# Patient Record
Sex: Female | Born: 1955 | Race: White | Hispanic: No | Marital: Married | Smoking: Never smoker
Health system: Southern US, Community
[De-identification: ages and names within clinical notes are randomized; demographics above are authoritative.]

## PROBLEM LIST (undated history)

## (undated) DIAGNOSIS — E119 Type 2 diabetes mellitus without complications: Secondary | ICD-10-CM

## (undated) DIAGNOSIS — Z8679 Personal history of other diseases of the circulatory system: Secondary | ICD-10-CM

## (undated) DIAGNOSIS — I428 Other cardiomyopathies: Secondary | ICD-10-CM

## (undated) DIAGNOSIS — K219 Gastro-esophageal reflux disease without esophagitis: Secondary | ICD-10-CM

## (undated) DIAGNOSIS — M1990 Unspecified osteoarthritis, unspecified site: Secondary | ICD-10-CM

## (undated) DIAGNOSIS — M5126 Other intervertebral disc displacement, lumbar region: Secondary | ICD-10-CM

## (undated) DIAGNOSIS — E669 Obesity, unspecified: Secondary | ICD-10-CM

## (undated) DIAGNOSIS — J189 Pneumonia, unspecified organism: Secondary | ICD-10-CM

## (undated) DIAGNOSIS — I429 Cardiomyopathy, unspecified: Secondary | ICD-10-CM

## (undated) DIAGNOSIS — R55 Syncope and collapse: Secondary | ICD-10-CM

## (undated) DIAGNOSIS — N6091 Unspecified benign mammary dysplasia of right breast: Secondary | ICD-10-CM

## (undated) DIAGNOSIS — Z8614 Personal history of Methicillin resistant Staphylococcus aureus infection: Secondary | ICD-10-CM

## (undated) DIAGNOSIS — Z8701 Personal history of pneumonia (recurrent): Secondary | ICD-10-CM

## (undated) DIAGNOSIS — R011 Cardiac murmur, unspecified: Secondary | ICD-10-CM

## (undated) HISTORY — PX: CATARACT EXTRACTION W/ INTRAOCULAR LENS  IMPLANT, BILATERAL: SHX1307

## (undated) HISTORY — PX: TUBAL LIGATION: SHX77

## (undated) HISTORY — DX: Other cardiomyopathies: I42.8

## (undated) HISTORY — PX: ABDOMINAL HYSTERECTOMY: SHX81

## (undated) HISTORY — PX: OTHER SURGICAL HISTORY: SHX169

## (undated) HISTORY — DX: Cardiomyopathy, unspecified: I42.9

## (undated) HISTORY — DX: Syncope and collapse: R55

## (undated) HISTORY — DX: Personal history of pneumonia (recurrent): Z87.01

## (undated) HISTORY — PX: DILATION AND CURETTAGE OF UTERUS: SHX78

## (undated) HISTORY — DX: Personal history of other diseases of the circulatory system: Z86.79

---

## 2004-10-30 ENCOUNTER — Ambulatory Visit: Payer: Self-pay | Admitting: Cardiology

## 2005-12-07 ENCOUNTER — Ambulatory Visit: Payer: Self-pay | Admitting: Cardiology

## 2006-07-20 ENCOUNTER — Encounter: Admission: RE | Admit: 2006-07-20 | Discharge: 2006-07-20 | Payer: Self-pay | Admitting: Occupational Medicine

## 2006-08-11 ENCOUNTER — Encounter: Admission: RE | Admit: 2006-08-11 | Discharge: 2006-11-09 | Payer: Self-pay | Admitting: Occupational Medicine

## 2006-12-30 ENCOUNTER — Encounter: Payer: Self-pay | Admitting: Physician Assistant

## 2006-12-30 ENCOUNTER — Ambulatory Visit: Payer: Self-pay | Admitting: Cardiology

## 2008-01-31 ENCOUNTER — Emergency Department (HOSPITAL_COMMUNITY): Admission: EM | Admit: 2008-01-31 | Discharge: 2008-01-31 | Payer: Self-pay | Admitting: Emergency Medicine

## 2009-10-20 HISTORY — PX: MICRODISCECTOMY LUMBAR: SUR864

## 2009-11-03 ENCOUNTER — Encounter: Payer: Self-pay | Admitting: Physician Assistant

## 2009-11-04 ENCOUNTER — Ambulatory Visit: Payer: Self-pay | Admitting: Cardiology

## 2009-11-04 DIAGNOSIS — Z8669 Personal history of other diseases of the nervous system and sense organs: Secondary | ICD-10-CM

## 2009-11-06 ENCOUNTER — Ambulatory Visit (HOSPITAL_COMMUNITY): Admission: RE | Admit: 2009-11-06 | Discharge: 2009-11-07 | Payer: Self-pay | Admitting: Neurosurgery

## 2009-11-06 ENCOUNTER — Encounter: Payer: Self-pay | Admitting: Cardiology

## 2009-12-31 ENCOUNTER — Encounter (INDEPENDENT_AMBULATORY_CARE_PROVIDER_SITE_OTHER): Payer: Self-pay | Admitting: *Deleted

## 2009-12-31 ENCOUNTER — Ambulatory Visit: Payer: Self-pay | Admitting: Cardiology

## 2009-12-31 DIAGNOSIS — I429 Cardiomyopathy, unspecified: Secondary | ICD-10-CM | POA: Insufficient documentation

## 2010-01-12 ENCOUNTER — Encounter: Payer: Self-pay | Admitting: Cardiology

## 2010-01-12 ENCOUNTER — Ambulatory Visit: Payer: Self-pay

## 2010-01-12 ENCOUNTER — Ambulatory Visit: Payer: Self-pay | Admitting: Internal Medicine

## 2010-01-12 ENCOUNTER — Ambulatory Visit (HOSPITAL_COMMUNITY): Admission: RE | Admit: 2010-01-12 | Discharge: 2010-01-12 | Payer: Self-pay | Admitting: Cardiology

## 2010-01-14 ENCOUNTER — Encounter (INDEPENDENT_AMBULATORY_CARE_PROVIDER_SITE_OTHER): Payer: Self-pay | Admitting: *Deleted

## 2010-02-03 ENCOUNTER — Encounter: Payer: Self-pay | Admitting: Cardiology

## 2010-02-25 ENCOUNTER — Encounter (INDEPENDENT_AMBULATORY_CARE_PROVIDER_SITE_OTHER): Payer: Self-pay | Admitting: *Deleted

## 2010-03-02 ENCOUNTER — Encounter: Payer: Self-pay | Admitting: Cardiology

## 2010-03-12 ENCOUNTER — Telehealth (INDEPENDENT_AMBULATORY_CARE_PROVIDER_SITE_OTHER): Payer: Self-pay | Admitting: *Deleted

## 2011-01-19 NOTE — Miscellaneous (Signed)
Summary: Pt Info  Clinical Lists Changes  Observations: Added new observation of PI CARDIO: Your physician has requested that you regularly monitor and record your blood pressure readings at home.  Please use the same machine at the same time of day to check your readings and record them to bring to your follow-up visit. Have nurse check blood pressure/heart rate in 2 weeks and fax this along with your readings to our office.  Start Coreg (carvedilol) 6.25 mg by mouth two times a day. Stop Atenolol. (01/14/2010 8:09)      Patient Instructions: 1)  Your physician has requested that you regularly monitor and record your blood pressure readings at home.  Please use the same machine at the same time of day to check your readings and record them to bring to your follow-up visit. 2)  Have nurse check blood pressure/heart rate in 2 weeks and fax this along with your readings to our office.  3)  Start Coreg (carvedilol) 6.25 mg by mouth two times a day. 4)  Stop Atenolol.   Pt requested that this information be faxed to her at her work fax number.

## 2011-01-19 NOTE — Letter (Signed)
Summary: Work Writer at [employer]	[place]. [address]   [place], [place] [address]   Phone: [phone]  Fax: [phone]     December 31, 2009    Dawn Weiss   The above named patient had a medical visit today at:   1:40 pm.  Please take this into consideration when reviewing the time away from work/school.      Sincerely yours,  Architectural technologist

## 2011-01-19 NOTE — Progress Notes (Signed)
Summary:  BLOOD PRESSURE READINGS  Office Visit/ BLOOD PRESSURE READINGS   Imported By: Dorise Hiss 02/03/2010 08:26:25  _____________________________________________________________________  External Attachment:    Type:   Image     Comment:   External Document  Appended Document:  BLOOD PRESSURE READINGS Reviewed.  Continue Coreg and add Altace 2.5 mg at bedtime  This regimen is aimed at both BP and LV dysfunction.  Check BMET in 2 weeks.  Appended Document:  BLOOD PRESSURE READINGS Left message to call back on machine.  Appended Document:  BLOOD PRESSURE READINGS Patient informed of the above. med sent to [facility]. lab order faxed to the [facility].

## 2011-01-19 NOTE — Assessment & Plan Note (Signed)
Summary: 2 YR FU   Visit Type:  Follow-up Primary Provider:  Dr. Wyvonnia Lora   History of Present Illness: 55 year old woman returns to the office for a followup visit. I last saw her in 2008 with history detailed below. More recently she was seen by Dr. Juanda Chance for a preoperative evaluation prior to lumbar disc surgery. She reports no significant problems in the perioperative setting.  Laboratory data from November 2010 revealed WBC 8.4, hemoglobin 14.6, platelets 216, sodium 137, potassium 4.1, BUN 19, creatinine 0.6.  She has not had a followup echocardiogram over the last 4 years as best I can tell. We talked about this today. She also admits to not taking her regular cardiac medications for some time now.  She states that she joined [employer] several months ago. She has not been walking regularly. We discussed diet and exercise today.  Vital Signs:  Patient profile:   55 year old female Height:      65 inches Weight:      253 pounds Pulse rate:   91 / minute BP sitting:   133 / 84  (left arm) Cuff size:   large  Vitals Entered By: Carlye Grippe (December 31, 2009 1:17 PM)   Current Medications (verified): 1)  Aleve 220 Mg Tabs (Naproxen Sodium) .... As Directed 2)  Atenolol 25 Mg Tabs (Atenolol) .... Take 1 Tablet By Mouth Once A Day 3)  Diazepam 5 Mg Tabs (Diazepam) .... Take One By Mouth Every Six Hours As Needed  Allergies (verified): No Known Drug Allergies  Comments:  Nurse/Medical Assistant: The patient's medications and allergies were reviewed with the patient and were updated in the Medication and Allergy Lists. Patient verbally gave names.    Preventive Screening-Counseling & Management  Alcohol-Tobacco     Smoking Status: never  Current Medications (verified): 1)  Aleve 220 Mg Tabs (Naproxen Sodium) .... As Directed 2)  Atenolol 25 Mg Tabs (Atenolol) .... Take 1 Tablet By Mouth Once A Day 3)  Diazepam 5 Mg Tabs (Diazepam) .... Take One By Mouth  Every Six Hours As Needed  Allergies (verified): No Known Drug Allergies  Comments:  Nurse/Medical Assistant: The patient's medications and allergies were reviewed with the patient and were updated in the Medication and Allergy Lists. Patient verbally gave names.  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  Smoking Status:  never  Review of Systems  The patient denies anorexia, fever, chest pain, syncope, dyspnea on exertion, peripheral edema, prolonged cough, headaches, abdominal pain, melena, and hematochezia.         Chronic left knee pain. Back pain is getting better. Otherwise reviewed and negative.  Vital Signs:  Patient profile:   55 year old female Height:      65 inches Weight:      253 pounds Pulse rate:   91 / minute BP sitting:   133 / 84  (left arm) Cuff size:   large  Vitals Entered By: Carlye Grippe (December 31, 2009 1:17 PM)  Physical Exam  Additional Exam:  Obese woman in no acute distress. HEENT: Conjunctiva and lids normal, oropharynx  clear. Neck: Supple, no elevated sugar venous pressure, no bruits. Lungs: Clear to auscultation, nonlabored. Cardiac: Indistinct PMI, regular rate and rhythm, no loud systolic murmur or S3. Abdomen: Obese, unable to palpate liver edge, bowel sounds present. Extremities: Trace edema below the knees, symmetrical, distal pulses one plus. Skin: Warm and dry. Musculoskeletal: No kyphosis. Neuropsychiatric: Alert and oriented x3, affect appropriate.   Impression &  Recommendations:  Problem # 1:  OTHER PRIMARY CARDIOMYOPATHIES (ICD-425.4)  History of idiopathic cardiomyopathy with subsequent normalization of left ventricular systolic function on medical therapy. Last echocardiogram was in 2006. Symptomatically the patient reports NYHA class II dyspnea on exertion, no palpitations, and no chest pain. She has not been compliant with regular medical therapy or followup. We discussed these issues today, and will recommend a followup echocardiogram. At least an annual office visit will be anticipated. Beta blocker therapy is being reintroduced, mainly in light of her prior documented history of neurocardiogenic syncope. Further medications can be added as necessary. I have asked her to keep an eye on her blood pressure as well.  Her updated medication list for this problem includes:    Atenolol 25 Mg Tabs (Atenolol) .Marland Kitchen... Take 1 tablet by mouth once a day  Problem # 2:  SYNCOPE, HX OF (ICD-V12.49)  History of neurocardiogenic syncope, diagnosed at [facility] many years ago. No obvious recurrent events.  Orders: 2-D Echocardiogram (2D Echo)  Patient Instructions: 1)  Your physician has requested that you have an echocardiogram.  Echocardiography is a painless test that uses sound waves to create images of your heart. It provides your doctor with information about the size and shape of your heart and how well your heart's chambers and valves are working.  This procedure takes approximately one hour. There are no restrictions for this procedure. 2)  Take Atenolol 25mg  by mouth once daily. Refill was sent to your pharmacy today. 3)  Your physician wants you to follow-up in: 1 year. You will receive a reminder letter in the mail one-two months in advance. If you don't receive a letter, please call our office to schedule the follow-up appointment. Prescriptions: ATENOLOL 25 MG TABS (ATENOLOL) Take 1 tablet by mouth once a day  #30 x 6   Entered by:   Cyril Loosen, RN,  BSN   Authorized by:   Loreli Slot, MD, [facility]   Signed by:   Cyril Loosen, RN, BSN on 12/31/2009   Method used:   Electronically to        [facility]  [address] [id number] (retail)       [address]       [place], [place]  [address]       Ph: [phone] or [phone]       Fax: [phone]   RxID:   [id number]  Prescriptions: ATENOLOL 25 MG TABS (ATENOLOL) Take 1 tablet by mouth once a day  #30 x 6   Entered by:   Cyril Loosen, RN, BSN   Authorized by:   Loreli Slot, MD, [facility]   Signed by:   Cyril Loosen, RN, BSN on 12/31/2009   Method used:   [facility]ronically to        [facility]  [address] [id number] (retail)       [address]       [place], [place]  [address]  Ph: [phone] or [phone]       Fax: [phone]   RxID:   [id number]

## 2011-01-19 NOTE — Progress Notes (Signed)
Summary: Headaches and wants lab results  Phone Note Call from Patient Call back at [phone]   Summary of Call: Pt called to see if we had results of recent labs. Notified pt we have not received labs but I have printed these from [facility] system and will have Dr. Diona Browner review when he is in the office tomorrow.   Pt states she is having a lot of headache in the afternoon and wonders if the medications could be causing this.  Initial call taken by: Cyril Loosen, RN, BSN,  March 12, 2010 5:16 PM  Follow-up for Phone Call        Reviewed labs - see appended note.  We added Altace most recently - headache not generally a common side effect. Follow-up by: Loreli Slot, MD, [facility],  March 13, 2010 10:34 AM  Additional Follow-up for Phone Call Additional follow up Details #1::        Left message to call back on voicemail. Cyril Loosen, RN, BSN  March 13, 2010 3:14 PM     Additional Follow-up for Phone Call Additional follow up Details #2::    4:30pm Mrs. Dilorenzo retd call to [place] M> Follow-up by: Zachary George,  March 13, 2010 4:34 PM  Additional Follow-up for Phone Call Additional follow up Details #3:: Details for Additional Follow-up Action Taken: Pt notified and verbalized understanding. Pt will follow up with primary MD if headaches continue. Additional Follow-up by: Cyril Loosen, RN, BSN,  March 13, 2010 4:56 PM

## 2011-01-19 NOTE — Letter (Signed)
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   [place], [place] [address]   Phone: [phone]  Fax: [phone]        February 25, 2010 MRN: [id number]    [facility] [address] [place], [place]  [address]    Dear Ms. Clauson,    We ordered lab work to follow up on your electrolytes and kidney function following your medication change on February 17th. You were asked to do this lab work 2 weeks after this medication change. It does not appear this lab work as been done.  Please take the enclosed order to the [facility] at your earliest convenience to have this lab work done.      Sincerely,  Cyril Loosen, RN, BSN  This letter has been electronically signed by your physician.

## 2011-03-24 LAB — BASIC METABOLIC PANEL
BUN: 19 mg/dL (ref 6–23)
CO2: 26 mEq/L (ref 19–32)
Calcium: 9.6 mg/dL (ref 8.4–10.5)
Chloride: 102 mEq/L (ref 96–112)
Creatinine, Ser: 0.68 mg/dL (ref 0.4–1.2)
GFR calc Af Amer: >60 mL/min (ref >60)
GFR calc non Af Amer: >60 mL/min (ref >60)
Glucose, Bld: 129 mg/dL — ABNORMAL HIGH (ref 70–99)
Potassium: 4.1 mEq/L (ref 3.5–5.1)
Sodium: 137 mEq/L (ref 135–145)

## 2011-03-24 LAB — CBC
HCT: 41.7 % (ref 36.0–46.0)
Hemoglobin: 14.6 g/dL (ref 12.0–15.0)
MCHC: 35 g/dL (ref 30.0–36.0)
MCV: 89.5 fL (ref 78.0–100.0)
Platelets: 216 10*3/uL (ref 150–400)
RBC: 4.67 MIL/uL (ref 3.87–5.11)
RDW: 13.4 % (ref 11.5–15.5)
WBC: 8.4 10*3/uL (ref 4.0–10.5)

## 2011-05-07 NOTE — Assessment & Plan Note (Signed)
[facility]                          [facility] OFFICE NOTE   Dawn Weiss, Dawn Weiss                     MRN:          [id number]  DATE:12/30/2006                            DOB:          [birth year]    REFERRING PHYSICIAN:  Wyvonnia Lora   PRIMARY CARE PHYSICIAN:  Dr. Wyvonnia Lora.   REASON FOR VISIT:  Followup neurocardiogenic syncope and idiopathic  cardiomyopathy.   HISTORY OF PRESENT ILLNESS:  I saw Ms. Beitzel in December 2006.  She  has a history of idiopathic cardiomyopathy with subsequent improvement  to normal left ventricular systolic function on medical therapy.  She  has done well on the present medication outlined below and has had no  evidence of syncope.  Since her last visit, she has changed jobs and now  works at [employer].  She had an electrocardiogram obtained  today that showed a normal sinus rhythm at 72 beats per minute with no  marked changes in comparison to the previous tracing, except for a  decrease in QT interval, now at 459 milliseconds.   ALLERGIES:  NO KNOWN DRUG ALLERGIES.   PRESENT MEDICATIONS:  1. Enalapril 2.5 mg p.o. every day.  2. Atenolol 25 mg p.o. every day.   REVIEW OF SYSTEMS:  As described in the history of present illness.   PHYSICAL EXAMINATION:  VITAL SIGNS:  Blood pressure today is 130/82,  heart rate is 72, weight is 275 pounds.  GENERAL:  This is a morbidly obese woman, no acute distress.  HEENT:  Conjunctivae and lids are normal.  Pharynx is clear.  NECK:  Supple without elevated jugular venous pressure, loud bruits.  No  thyromegaly is noted.  LUNGS:  Clear without labored breathing.  CARDIAC:  Reveals a regular rate and rhythm without murmur, rub, or  gallop.  ABDOMEN;  Soft.  Normoactive bowel sounds.  EXTREMITIES:  Show no pitting edema.   IMPRESSION/RECOMMENDATIONS:  1. History of neurocardiogenic syncope, previously evaluated at [facility].   She is doing well on beta-blocker      therapy without significant recurrence.  We will make no specific      adjustments today.  2. Given prior history of idiopathic cardiomyopathy, we will plan a      follow up echocardiogram (last study in 2003).  She is not      demonstrating any congestive heart failure symptoms,      although if her left ventricular systolic function is reduced that      would prompt medication adjustments.  Otherwise if this is stable,      we will plan to continue her yearly followup.     [name], MD  Electronically Signed    SGM/MedQ  DD: 12/30/2006  DT: 12/30/2006  Job #: [id number]   cc:   Wyvonnia Lora

## 2011-05-18 ENCOUNTER — Ambulatory Visit: Payer: Self-pay | Admitting: Cardiology

## 2011-05-19 ENCOUNTER — Ambulatory Visit: Payer: Self-pay | Admitting: Cardiology

## 2011-05-28 ENCOUNTER — Encounter: Payer: Self-pay | Admitting: Cardiology

## 2011-05-31 ENCOUNTER — Ambulatory Visit (INDEPENDENT_AMBULATORY_CARE_PROVIDER_SITE_OTHER): Payer: BC Managed Care – PPO | Admitting: Cardiology

## 2011-05-31 ENCOUNTER — Encounter: Payer: Self-pay | Admitting: *Deleted

## 2011-05-31 ENCOUNTER — Encounter: Payer: Self-pay | Admitting: Cardiology

## 2011-05-31 VITALS — BP 135/80 | HR 69 | Ht 65.0 in | Wt 282.0 lb

## 2011-05-31 DIAGNOSIS — Z79899 Other long term (current) drug therapy: Secondary | ICD-10-CM

## 2011-05-31 DIAGNOSIS — I429 Cardiomyopathy, unspecified: Secondary | ICD-10-CM

## 2011-05-31 DIAGNOSIS — R55 Syncope and collapse: Secondary | ICD-10-CM

## 2011-05-31 MED ORDER — CARVEDILOL 6.25 MG PO TABS: 6.2500 mg | ORAL_TABLET | Freq: Two times a day (BID) | ORAL | Status: DC

## 2011-05-31 MED ORDER — RAMIPRIL 2.5 MG PO CAPS: 2.5000 mg | ORAL_CAPSULE | Freq: Every day | ORAL | Status: DC

## 2011-05-31 NOTE — Progress Notes (Signed)
Clinical Summary Dawn Weiss is a 55 y.o.female presenting for office followup. She was seen back in January. At that time we reintroduced basic medical therapy, finding that she had not taken any regular medications for quite some time. Followup echocardiogram is noted below.  She has a history of neurocardiogenic syncope, no obvious recurrences. She has had some mild dizziness recently. No palpitations.  We discussed the results of her echocardiogram, showing reduction in LVEF compared to previous baseline. She has gained a significant amount of weight, admits that she has not been dieting regularly, and also not exercising. She has changed jobs since our last visit.  Today we discussed the importance of diet, exercise, regular medication use, and followup.   No Known Allergies  Current outpatient prescriptions:carvedilol (COREG) 6.25 MG tablet, Take 1 tablet (6.25 mg total) by mouth 2 (two) times daily., Disp: 60 tablet, Rfl: 6;  diazepam (VALIUM) 5 MG tablet, Take 5 mg by mouth every 6 (six) hours as needed.  , Disp: , Rfl: ;  naproxen sodium (ALEVE) 220 MG tablet, Take 220 mg by mouth as directed.  , Disp: , Rfl: ;  ramipril (ALTACE) 2.5 MG capsule, Take 1 capsule (2.5 mg total) by mouth daily., Disp: 30 capsule, Rfl: 6 DISCONTD: carvedilol (COREG) 6.25 MG tablet, Take 6.25 mg by mouth 2 (two) times daily.  , Disp: , Rfl: ;  DISCONTD: ramipril (ALTACE) 2.5 MG capsule, Take 2.5 mg by mouth at bedtime.  , Disp: , Rfl:   Past Medical History  Diagnosis Date  . Syncope     Neurocardiogenic  . Idiopathic cardiomyopathy     Subsequent normalization of LVEF    Past Surgical History  Procedure Date  . Microdiscectomy lumbar 11/10    Right L5-S1   . Left knee arthroscopic surgery     History reviewed. No pertinent family history.  Social History Dawn Weiss reports that she has never smoked. She has never used smokeless tobacco. Dawn Weiss reports that she does not drink  alcohol.  Review of Systems Chronic left knee pain. Situational stress. Patient's mother died last month. Otherwise reviewed and negative.  Physical Examination Filed Vitals:   05/31/11 0838  BP: 135/80  Pulse: 69   Additional Exam: Obese woman in no acute distress.  HEENT: Conjunctiva and lids normal, oropharynx clear.  Neck: Supple, no elevated sugar venous pressure, no bruits.  Lungs: Clear to auscultation, nonlabored.  Cardiac: Indistinct PMI, regular rate and rhythm, no loud systolic murmur or S3.  Abdomen: Obese, unable to palpate liver edge, bowel sounds present.  Extremities: Trace edema below the knees, symmetrical, distal pulses one plus.  Skin: Warm and dry.  Musculoskeletal: No kyphosis.  Neuropsychiatric: Alert and oriented x3, affect appropriate.   ECG Normal sinus rhythm at 67 beats per minute.  Studies Echocardiogram 01/12/2010: LVEF approximately 40% with diffuse hypokinesis, severely dilated chamber size, grade 1 diastolic dysfunction, mild aortic regurgitation, mild mitral regurgitation, severe left atrial enlargement.  Problem List and Plan

## 2011-05-31 NOTE — Patient Instructions (Signed)
Follow up as scheduled. Start Altace (ramipril) 2.5 mg daily. Continue Coreg (carvedilol) 6.25 mg two times a day. Your physician recommends that you go to the [facility] for lab work: BMET-DO IN 2 WEEKS-  If the results of your test are normal or stable, you will receive a letter. If they are abnormal, the nurse will contact you by phone.

## 2011-05-31 NOTE — Assessment & Plan Note (Signed)
History of neurocardiogenic syncope, no obvious recurrence. She has had some mild dizziness. No palpitations. Plan to continue beta blocker and conservative measures at this time unless symptoms progress.

## 2011-05-31 NOTE — Assessment & Plan Note (Signed)
LVEF approximately 40% by followup echocardiogram in January. She has previous documentation of cardiomyopathy, with normalization of function on medical therapy. At this point, plan is to further optimize medications, continuing Coreg with the addition of Altace. Diet and exercise were recommended. We will followup LVEF over time.

## 2011-05-31 NOTE — Assessment & Plan Note (Signed)
Diet and weight loss recommended. She had been using Weight Watchers in the past.

## 2011-08-27 ENCOUNTER — Encounter: Payer: Self-pay | Admitting: Cardiology

## 2011-08-31 ENCOUNTER — Ambulatory Visit: Payer: BC Managed Care – PPO | Admitting: Cardiology

## 2011-11-23 ENCOUNTER — Emergency Department (INDEPENDENT_AMBULATORY_CARE_PROVIDER_SITE_OTHER)
Admission: EM | Admit: 2011-11-23 | Discharge: 2011-11-23 | Disposition: A | Discharge status: Home / Self Care | Payer: BC Managed Care – PPO | Source: Home / Self Care | Attending: Family Medicine | Admitting: Family Medicine

## 2011-11-23 ENCOUNTER — Encounter (HOSPITAL_COMMUNITY): Payer: Self-pay | Admitting: Emergency Medicine

## 2011-11-23 DIAGNOSIS — R6889 Other general symptoms and signs: Secondary | ICD-10-CM

## 2011-11-23 MED ORDER — HYDROCOD POLST-CHLORPHEN POLST 10-8 MG/5ML PO LQCR: 5.0000 mL | Freq: Two times a day (BID) | ORAL | Status: DC

## 2011-11-23 NOTE — ED Notes (Signed)
Mother here with son with sx bronchitis that started last Tuesday with  Body aches and chills then thrus constant cough with lil greenish phlegm.pt has hx bronchitis in the past.denies cp but has some sob.

## 2011-11-23 NOTE — ED Provider Notes (Addendum)
History     CSN: [id number] Arrival date & time: 11/23/2011 12:07 PM   First MD Initiated Contact with Patient 11/23/11 1124      Chief Complaint  Patient presents with  . Bronchitis  . URI    (Consider location/radiation/quality/duration/timing/severity/associated sxs/prior treatment) Patient is a 55 y.o. female presenting with URI. The history is provided by the patient.  URI The primary symptoms include fever, cough and myalgias. Primary symptoms do not include nausea or vomiting. The current episode started 6 to 7 days ago. This is a new problem. The problem has been gradually improving.  The onset of the illness is associated with exposure to sick contacts.    Past Medical History  Diagnosis Date  . Syncope     Neurocardiogenic  . Idiopathic cardiomyopathy     Subsequent normalization of LVEF    Past Surgical History  Procedure Date  . Microdiscectomy lumbar 11/10    Right L5-S1   . Left knee arthroscopic surgery     History reviewed. No pertinent family history.  History  Substance Use Topics  . Smoking status: Never Smoker   . Smokeless tobacco: Never Used  . Alcohol Use: No    OB History    Grav Para Term Preterm Abortions TAB SAB Ect Mult Living                  Review of Systems  Constitutional: Positive for fever.  HENT: Positive for postnasal drip.   Respiratory: Positive for cough.   Gastrointestinal: Negative for nausea and vomiting.  Genitourinary: Negative.   Musculoskeletal: Positive for myalgias.  Skin: Negative.     Allergies  Review of patient's allergies indicates no known allergies.  Home Medications   Current Outpatient Rx  Name Route Sig Dispense Refill  . ATENOLOL 100 MG PO TABS Oral Take 100 mg by mouth daily.      Marland Kitchen CARVEDILOL 6.25 MG PO TABS Oral Take 1 tablet (6.25 mg total) by mouth 2 (two) times daily. 60 tablet 6  . DIAZEPAM 5 MG PO TABS Oral Take 5 mg by mouth every 6 (six) hours as needed.      Marland Kitchen NAPROXEN SODIUM  220 MG PO TABS Oral Take 220 mg by mouth as directed.      Marland Kitchen RAMIPRIL 2.5 MG PO CAPS Oral Take 1 capsule (2.5 mg total) by mouth daily. 30 capsule 6    BP 118/82  Pulse 97  Temp(Src) 98.8 F (37.1 C) (Oral)  Resp 20  SpO2 98%  Physical Exam  Nursing note and vitals reviewed. Constitutional: She is oriented to person, place, and time. She appears well-developed and well-nourished.  HENT:  Head: Normocephalic.  Right Ear: External ear normal.  Left Ear: External ear normal.  Mouth/Throat: Oropharynx is clear and moist.  Eyes: Conjunctivae and EOM are normal. Pupils are equal, round, and reactive to light.  Neck: Normal range of motion. Neck supple.  Cardiovascular: Normal rate, normal heart sounds and intact distal pulses.   Pulmonary/Chest: Effort normal and breath sounds normal.  Neurological: She is alert and oriented to person, place, and time.  Skin: Skin is warm and dry.    ED Course  Procedures (including critical care time)  Labs Reviewed - No data to display No results found.   No diagnosis found.    MDM          Barkley Bruns, MD 11/23/11 1312  Barkley Bruns, MD 11/23/11 1323

## 2011-11-28 ENCOUNTER — Emergency Department (HOSPITAL_COMMUNITY): Payer: BC Managed Care – PPO

## 2011-11-28 ENCOUNTER — Other Ambulatory Visit: Payer: Self-pay

## 2011-11-28 ENCOUNTER — Inpatient Hospital Stay (HOSPITAL_COMMUNITY)
Admission: EM | Admit: 2011-11-28 | Discharge: 2011-12-01 | DRG: 090 | Disposition: A | Discharge status: Home / Self Care | Payer: BC Managed Care – PPO | Attending: Internal Medicine | Admitting: Internal Medicine

## 2011-11-28 ENCOUNTER — Encounter (HOSPITAL_COMMUNITY): Payer: Self-pay | Admitting: *Deleted

## 2011-11-28 DIAGNOSIS — R739 Hyperglycemia, unspecified: Secondary | ICD-10-CM | POA: Diagnosis present

## 2011-11-28 DIAGNOSIS — E669 Obesity, unspecified: Secondary | ICD-10-CM | POA: Diagnosis present

## 2011-11-28 DIAGNOSIS — R000 Tachycardia, unspecified: Secondary | ICD-10-CM | POA: Diagnosis present

## 2011-11-28 DIAGNOSIS — J209 Acute bronchitis, unspecified: Secondary | ICD-10-CM | POA: Diagnosis present

## 2011-11-28 DIAGNOSIS — R05 Cough: Secondary | ICD-10-CM | POA: Diagnosis present

## 2011-11-28 DIAGNOSIS — I10 Essential (primary) hypertension: Secondary | ICD-10-CM | POA: Diagnosis present

## 2011-11-28 DIAGNOSIS — R0789 Other chest pain: Secondary | ICD-10-CM | POA: Diagnosis present

## 2011-11-28 DIAGNOSIS — J189 Pneumonia, unspecified organism: Principal | ICD-10-CM | POA: Diagnosis present

## 2011-11-28 DIAGNOSIS — R7309 Other abnormal glucose: Secondary | ICD-10-CM | POA: Diagnosis present

## 2011-11-28 DIAGNOSIS — I2589 Other forms of chronic ischemic heart disease: Secondary | ICD-10-CM | POA: Diagnosis present

## 2011-11-28 DIAGNOSIS — R059 Cough, unspecified: Secondary | ICD-10-CM | POA: Diagnosis present

## 2011-11-28 DIAGNOSIS — J9801 Acute bronchospasm: Secondary | ICD-10-CM | POA: Diagnosis present

## 2011-11-28 DIAGNOSIS — D649 Anemia, unspecified: Secondary | ICD-10-CM | POA: Diagnosis present

## 2011-11-28 DIAGNOSIS — Z8679 Personal history of other diseases of the circulatory system: Secondary | ICD-10-CM

## 2011-11-28 DIAGNOSIS — R071 Chest pain on breathing: Secondary | ICD-10-CM | POA: Diagnosis present

## 2011-11-28 HISTORY — DX: Pneumonia, unspecified organism: J18.9

## 2011-11-28 HISTORY — DX: Obesity, unspecified: E66.9

## 2011-11-28 LAB — DIFFERENTIAL
Basophils Relative: 0 % (ref 0–1)
Lymphocytes Relative: 10 % — ABNORMAL LOW (ref 12–46)
Lymphs Abs: 1.9 10*3/uL (ref 0.7–4.0)
Monocytes Absolute: 1.6 10*3/uL — ABNORMAL HIGH (ref 0.1–1.0)
Monocytes Relative: 9 % (ref 3–12)
Neutro Abs: 14.3 10*3/uL — ABNORMAL HIGH (ref 1.7–7.7)
Neutrophils Relative %: 80 % — ABNORMAL HIGH (ref 43–77)

## 2011-11-28 LAB — BASIC METABOLIC PANEL
Chloride: 102 mEq/L (ref 96–112)
GFR calc Af Amer: 81 mL/min — ABNORMAL LOW (ref >90)
GFR calc non Af Amer: 70 mL/min — ABNORMAL LOW (ref >90)
Potassium: 3.6 mEq/L (ref 3.5–5.1)
Sodium: 137 mEq/L (ref 135–145)

## 2011-11-28 LAB — CBC
HCT: 40.5 % (ref 36.0–46.0)
Hemoglobin: 13.3 g/dL (ref 12.0–15.0)
RBC: 4.49 MIL/uL (ref 3.87–5.11)
WBC: 17.9 10*3/uL — ABNORMAL HIGH (ref 4.0–10.5)

## 2011-11-28 LAB — INFLUENZA PANEL BY PCR (TYPE A & B)
H1N1 flu by pcr: NOT DETECTED
Influenza B By PCR: NEGATIVE

## 2011-11-28 MED ORDER — DOCUSATE SODIUM 100 MG PO CAPS
100.0000 mg | ORAL_CAPSULE | Freq: Two times a day (BID) | ORAL | Status: DC
  Administered 2011-11-28 – 2011-12-01 (×6): 100 mg via ORAL
  Filled 2011-11-28 (×10): qty 1

## 2011-11-28 MED ORDER — AZITHROMYCIN 250 MG PO TABS
500.0000 mg | ORAL_TABLET | Freq: Once | ORAL | Status: AC
  Administered 2011-11-28: 500 mg via ORAL
  Filled 2011-11-28: qty 2

## 2011-11-28 MED ORDER — POTASSIUM CHLORIDE IN NACL 20-0.9 MEQ/L-% IV SOLN
INTRAVENOUS | Status: DC
  Administered 2011-11-28 – 2011-11-30 (×2): via INTRAVENOUS

## 2011-11-28 MED ORDER — HYDROMORPHONE HCL PF 1 MG/ML IJ SOLN
1.0000 mg | Freq: Once | INTRAMUSCULAR | Status: DC
  Filled 2011-11-28: qty 1

## 2011-11-28 MED ORDER — HYDROCOD POLST-CHLORPHEN POLST 10-8 MG/5ML PO LQCR
5.0000 mL | Freq: Once | ORAL | Status: AC
  Administered 2011-11-28: 5 mL via ORAL
  Filled 2011-11-28: qty 5

## 2011-11-28 MED ORDER — SODIUM CHLORIDE 0.9 % IV SOLN: INTRAVENOUS | Status: DC

## 2011-11-28 MED ORDER — ONDANSETRON HCL 4 MG PO TABS: 4.0000 mg | ORAL_TABLET | Freq: Four times a day (QID) | ORAL | Status: DC | PRN

## 2011-11-28 MED ORDER — DEXTROSE 5 % IV SOLN
1.0000 g | INTRAVENOUS | Status: DC
  Administered 2011-11-29 – 2011-12-01 (×3): 1 g via INTRAVENOUS
  Filled 2011-11-28 (×4): qty 10

## 2011-11-28 MED ORDER — ACETAMINOPHEN 650 MG RE SUPP: 650.0000 mg | Freq: Four times a day (QID) | RECTAL | Status: DC | PRN

## 2011-11-28 MED ORDER — DEXTROSE 5 % IV SOLN
500.0000 mg | INTRAVENOUS | Status: DC
  Administered 2011-11-29 – 2011-12-01 (×3): 500 mg via INTRAVENOUS
  Filled 2011-11-28 (×4): qty 500

## 2011-11-28 MED ORDER — LEVALBUTEROL HCL 0.63 MG/3ML IN NEBU
0.6300 mg | INHALATION_SOLUTION | Freq: Four times a day (QID) | RESPIRATORY_TRACT | Status: DC
  Administered 2011-11-29 – 2011-12-01 (×8): 0.63 mg via RESPIRATORY_TRACT
  Filled 2011-11-28 (×9): qty 3

## 2011-11-28 MED ORDER — BENZONATATE 100 MG PO CAPS
200.0000 mg | ORAL_CAPSULE | Freq: Once | ORAL | Status: AC
  Administered 2011-11-28: 200 mg via ORAL
  Filled 2011-11-28: qty 2

## 2011-11-28 MED ORDER — TRAZODONE HCL 50 MG PO TABS
25.0000 mg | ORAL_TABLET | Freq: Every evening | ORAL | Status: DC | PRN
  Administered 2011-11-28: 25 mg via ORAL
  Filled 2011-11-28 (×2): qty 1

## 2011-11-28 MED ORDER — ENOXAPARIN SODIUM 40 MG/0.4ML ~~LOC~~ SOLN
40.0000 mg | SUBCUTANEOUS | Status: DC
  Administered 2011-11-28 – 2011-11-30 (×3): 40 mg via SUBCUTANEOUS
  Filled 2011-11-28 (×3): qty 0.4

## 2011-11-28 MED ORDER — HYDROCOD POLST-CHLORPHEN POLST 10-8 MG/5ML PO LQCR
5.0000 mL | Freq: Two times a day (BID) | ORAL | Status: DC
  Administered 2011-11-28 – 2011-12-01 (×6): 5 mL via ORAL
  Filled 2011-11-28 (×6): qty 5

## 2011-11-28 MED ORDER — FAMOTIDINE 20 MG PO TABS
20.0000 mg | ORAL_TABLET | Freq: Every day | ORAL | Status: DC
  Administered 2011-11-28 – 2011-12-01 (×4): 20 mg via ORAL
  Filled 2011-11-28 (×4): qty 1

## 2011-11-28 MED ORDER — ACETAMINOPHEN 325 MG PO TABS
650.0000 mg | ORAL_TABLET | Freq: Four times a day (QID) | ORAL | Status: DC | PRN
  Administered 2011-11-28 – 2011-11-30 (×3): 650 mg via ORAL
  Filled 2011-11-28 (×3): qty 2

## 2011-11-28 MED ORDER — BENZONATATE 100 MG PO CAPS
200.0000 mg | ORAL_CAPSULE | Freq: Three times a day (TID) | ORAL | Status: DC
  Administered 2011-11-28 – 2011-12-01 (×10): 200 mg via ORAL
  Filled 2011-11-28: qty 2
  Filled 2011-11-28: qty 1
  Filled 2011-11-28 (×5): qty 2
  Filled 2011-11-28: qty 1
  Filled 2011-11-28 (×3): qty 2

## 2011-11-28 MED ORDER — GUAIFENESIN ER 600 MG PO TB12
600.0000 mg | ORAL_TABLET | Freq: Two times a day (BID) | ORAL | Status: DC
  Administered 2011-11-28 – 2011-12-01 (×6): 600 mg via ORAL
  Filled 2011-11-28 (×10): qty 1

## 2011-11-28 MED ORDER — ALUM & MAG HYDROXIDE-SIMETH 200-200-20 MG/5ML PO SUSP: 30.0000 mL | Freq: Four times a day (QID) | ORAL | Status: DC | PRN

## 2011-11-28 MED ORDER — HYDROMORPHONE HCL PF 1 MG/ML IJ SOLN
1.0000 mg | Freq: Once | INTRAMUSCULAR | Status: AC
  Administered 2011-11-28: 1 mg via INTRAVENOUS

## 2011-11-28 MED ORDER — ONDANSETRON HCL 4 MG/2ML IJ SOLN: 4.0000 mg | Freq: Four times a day (QID) | INTRAMUSCULAR | Status: DC | PRN

## 2011-11-28 MED ORDER — MORPHINE SULFATE 2 MG/ML IJ SOLN: 4.0000 mg | INTRAMUSCULAR | Status: DC | PRN

## 2011-11-28 MED ORDER — DEXTROSE 5 % IV SOLN
1.0000 g | Freq: Once | INTRAVENOUS | Status: AC
  Administered 2011-11-28: 1 g via INTRAVENOUS
  Filled 2011-11-28: qty 10

## 2011-11-28 MED ORDER — SODIUM CHLORIDE 0.9 % IV SOLN
INTRAVENOUS | Status: DC
  Administered 2011-11-28: 17:00:00 via INTRAVENOUS

## 2011-11-28 MED ORDER — SODIUM CHLORIDE 0.9 % IJ SOLN
INTRAMUSCULAR | Status: AC
  Administered 2011-11-28: 18:00:00
  Filled 2011-11-28: qty 3

## 2011-11-28 MED ORDER — GUAIFENESIN-DM 100-10 MG/5ML PO SYRP
5.0000 mL | ORAL_SOLUTION | ORAL | Status: DC | PRN
  Administered 2011-11-28 – 2011-12-01 (×6): 5 mL via ORAL
  Filled 2011-11-28 (×6): qty 5

## 2011-11-28 MED ORDER — OXYCODONE HCL 5 MG PO TABS
5.0000 mg | ORAL_TABLET | ORAL | Status: DC | PRN
  Administered 2011-11-29: 5 mg via ORAL
  Filled 2011-11-28: qty 1

## 2011-11-28 MED ORDER — ONDANSETRON HCL 4 MG/2ML IJ SOLN
4.0000 mg | Freq: Once | INTRAMUSCULAR | Status: AC
  Administered 2011-11-28: 4 mg via INTRAVENOUS
  Filled 2011-11-28: qty 2

## 2011-11-28 NOTE — ED Notes (Signed)
Pt c/o cough and congestion x 1 week. Pt states that she is coughing up green phlegm. C/o pain in her chest, abdomen and ribs that is worse with cough and deep breathing.

## 2011-11-28 NOTE — ED Notes (Signed)
Pt ambulated to restroom and then transported to x-ray.

## 2011-11-28 NOTE — ED Notes (Signed)
PA stated pt does not need blood cultures or lab before being medicated with rocephin iv

## 2011-11-28 NOTE — ED Notes (Signed)
Hallway 4

## 2011-11-28 NOTE — ED Notes (Signed)
Ambulated pt around nurses station. HR remained ~ 140 with O2 sat ranging from 93-95% while ambulating. Pt complained of dizziness. PA aware and plan is now to admit patient. NAD. Pt continues to have cough.

## 2011-11-28 NOTE — H&P (Signed)
Dawn Weiss MRN: [id number] DOB/AGE: 54-Dec-1957 54 y.o. Primary Care Physician:TAPPER,DAVID B, MD Admit date: 11/28/2011 Chief Complaint: Cough, shortness of breath, and chest congestion. HPI: The patient is a 55 year old woman with a past medical history significant for idiopathic cardiomyopathy, who presents to the emergency department today with a chief complaint of a relentless cough, shortness of breath, and chest congestion. She says that she had the flu 2 weeks ago. Her symptoms subsided a little, however, she continued to have a relentless cough. She went to the urgent care on 11/23/2011 for treatment. She was given Tussionex which only helped a little. Over the past several days, she has had a subjective and objective fever with a temperature ranging in the low 100s at home. Her cough has been productive with green sputum and intermittently nonproductive. She has had chest wall pain and abdominal pain from the constant coughing. She says that she is sore all over. She has a sore throat from coughing. She has some shortness of breath but no outright pleurisy. She denies nausea, vomiting, diarrhea, and pain with urination. She denies any increase in unilateral leg swelling.  In the emergency department, she is noted to be afebrile and hemodynamically stable although she has been mildly tachycardic. Her white blood cell count is elevated at 17.9. Her chest x-ray reveals left lower lobe opacity, possibly pneumonia. She is being admitted for further evaluation and management.   Past Medical History  Diagnosis Date  . Syncope     Neurocardiogenic  . Idiopathic cardiomyopathy     Subsequent normalization of LVEF  . Obesity     Past Surgical History  Procedure Date  . Microdiscectomy lumbar 11/10    Right L5-S1   . Left knee arthroscopic surgery   . Abdominal hysterectomy     Prior to Admission medications   Medication Sig Start Date End Date Taking? Authorizing Provider  atenolol  (TENORMIN) 100 MG tablet Take 100 mg by mouth daily.     Yes Historical Provider, MD  carvedilol (COREG) 6.25 MG tablet Take 1 tablet (6.25 mg total) by mouth 2 (two) times daily. 05/31/11  Yes [name], MD  chlorpheniramine-HYDROcodone (TUSSIONEX) 10-8 MG/5ML LQCR Take 5 mLs by mouth every 12 (twelve) hours. Cough  11/23/11  Yes Barkley Bruns, MD  naproxen sodium (ALEVE) 220 MG tablet Take 220 mg by mouth daily as needed. Pain   Yes Historical Provider, MD    Allergies: No Known Allergies  Family history: Her father died of complications from myasthenia gravis. Her mother possibly died from congestive heart failure.  Social History: She is married. She lives in [place]. She has 2 children. 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 She denies tobacco, alcohol, and illicit drug use.     ROS: As above in history present illness, otherwise negative.  PHYSICAL EXAM: Blood pressure 113/75, pulse 116, temperature 99 F (37.2 C), temperature source Oral, resp. rate 16, height 5\' 5"  (1.651 m), weight 117.935 kg (260 lb), SpO2 94.00%. General: Pleasant obese 55 year old Caucasian woman who is currently lying in bed. She does appear ill. HEENT: Head is normocephalic, nontraumatic. Pupils are equal, round, and reactive to light. Extraocular movements are intact. Conjunctiva is clear on the right and mildly erythematous on the left. Sclerae are white. Oropharynx reveals mildly dry mucous membranes. No posterior exudates or erythema. Neck: Supple, no adenopathy, no thyromegaly, no JVD. Heart: S1, S2, with mild tachycardia. Lungs: Occasional wheezes and crackles  auscultated bilaterally. Breathing is nonlabored. Multiple nonproductive coughs. Abdomen: Obese, soft, mildly tender diffusely, no rigidity, no distention, no masses palpated. Extremities: No pedal edema. Neurologic: Alert and oriented x3. Cranial nerves II through XII are intact. Strength  is 5 over 5 throughout. Sensation is intact.  Basic Metabolic Panel:  Basename 11/28/11 1515  NA 137  K 3.6  CL 102  CO2 26  GLUCOSE 128*  BUN 14  CREATININE 0.91  CALCIUM 9.5  MG --  PHOS --   Liver Function Tests: No results found for this basename: AST:2,ALT:2,ALKPHOS:2,BILITOT:2,PROT:2,ALBUMIN:2 in the last 72 hours No results found for this basename: LIPASE:2,AMYLASE:2 in the last 72 hours No results found for this basename: AMMONIA:2 in the last 72 hours CBC:  Basename 11/28/11 1515  WBC 17.9*  NEUTROABS 14.3*  HGB 13.3  HCT 40.5  MCV 90.2  PLT 279   Cardiac Enzymes: No results found for this basename: CKTOTAL:3,CKMB:3,CKMBINDEX:3,TROPONINI:3 in the last 72 hours BNP: No results found for this basename: POCBNP:3 in the last 72 hours D-Dimer:  [facility] 11/28/11 1515  DDIMER 1.59*   CBG: No results found for this basename: GLUCAP:6 in the last 72 hours Hemoglobin A1C: No results found for this basename: HGBA1C in the last 72 hours Fasting Lipid Panel: No results found for this basename: CHOL,HDL,LDLCALC,TRIG,CHOLHDL,LDLDIRECT in the last 72 hours Thyroid Function Tests: No results found for this basename: TSH,T4TOTAL,FREET4,T3FREE,THYROIDAB in the last 72 hours Anemia Panel: No results found for this basename: VITAMINB12,FOLATE,FERRITIN,TIBC,IRON,RETICCTPCT in the last 72 hours Coagulation: No results found for this basename: LABPROT:2,INR:2 in the last 72 hours Urine Drug Screen: Drugs of Abuse  No results found for this basename: labopia, cocainscrnur, labbenz, amphetmu, thcu, labbarb    Alcohol Level: No results found for this basename: ETH:2 in the last 72 hours   EKG: Pending.  No results found for this or any previous visit (from the past 240 hour(s)).   Results for orders placed during the hospital encounter of 11/28/11 (from the past 48 hour(s))  BASIC METABOLIC PANEL     Status: Abnormal   Collection Time   11/28/11  3:15 PM       Component Value Range Comment   Sodium 137  135 - 145 (mEq/L)    Potassium 3.6  3.5 - 5.1 (mEq/L)    Chloride 102  96 - 112 (mEq/L)    CO2 26  19 - 32 (mEq/L)    Glucose, Bld 128 (*) 70 - 99 (mg/dL)    BUN 14  6 - 23 (mg/dL)    Creatinine, Ser 1.61  0.50 - 1.10 (mg/dL)    Calcium 9.5  8.4 - 10.5 (mg/dL)    GFR calc non Af Amer 70 (*) >90 (mL/min)    GFR calc Af Amer 81 (*) >90 (mL/min)   CBC     Status: Abnormal   Collection Time   11/28/11  3:15 PM      Component Value Range Comment   WBC 17.9 (*) 4.0 - 10.5 (K/uL)    RBC 4.49  3.87 - 5.11 (MIL/uL)    Hemoglobin 13.3  12.0 - 15.0 (g/dL)    HCT 09.6  04.5 - 40.9 (%)    MCV 90.2  78.0 - 100.0 (fL)    MCH 29.6  26.0 - 34.0 (pg)    MCHC 32.8  30.0 - 36.0 (g/dL)    RDW 81.1  91.4 - 78.2 (%)    Platelets 279  150 - 400 (K/uL)   DIFFERENTIAL  Status: Abnormal   Collection Time   11/28/11  3:15 PM      Component Value Range Comment   Neutrophils Relative 80 (*) 43 - 77 (%)    Neutro Abs 14.3 (*) 1.7 - 7.7 (K/uL)    Lymphocytes Relative 10 (*) 12 - 46 (%)    Lymphs Abs 1.9  0.7 - 4.0 (K/uL)    Monocytes Relative 9  3 - 12 (%)    Monocytes Absolute 1.6 (*) 0.1 - 1.0 (K/uL)    Eosinophils Relative 1  0 - 5 (%)    Eosinophils Absolute 0.1  0.0 - 0.7 (K/uL)    Basophils Relative 0  0 - 1 (%)    Basophils Absolute 0.0  0.0 - 0.1 (K/uL)   D-DIMER, QUANTITATIVE     Status: Abnormal   Collection Time   11/28/11  3:15 PM      Component Value Range Comment   D-Dimer, Quant 1.59 (*) 0.00 - 0.48 (ug/mL-FEU)     Dg Chest 2 View  11/28/2011  *RADIOLOGY REPORT*  Clinical Data: Cough  CHEST - 2 VIEW  Comparison: 11/06/2009  Findings: Left lower lung opacity on the frontal radiograph, without corresponding abnormality on the lateral view, possibly reflecting pneumonia.  Right lung essentially clear.  No effusion  Cardiomediastinal silhouette is within normal limits.  Mild degenerative changes of the visualized thoracolumbar spine.  IMPRESSION:  Left lower lung opacity, possibly reflecting pneumonia.  Original Report Authenticated By: Charline Bills, M.D.    Impression:  Principal Problem:  *PNA (pneumonia) Active Problems:  Chest wall pain  History of cardiomyopathy  Tachycardia  Obesity  Cough  Bronchospasm  1. Community-acquired pneumonia with ongoing productive and nonproductive cough, bronchospasms, and recent history of influenza infection.  Chest wall pain secondary to coughing.  Sinus tachycardia, likely secondary to mild respiratory distress and volume depletion.  History of cardiomyopathy with normalization of ejection fraction. The patient has listed as her home medications atenolol and carvedilol. I believe this is a mistake. The patient is unsure. She has been out of her home medications for 2 weeks secondary to infections.   Plan:  1. The patient received 1 L of IV fluids in the emergency department. She also received Rocephin and azithromycin intravenously. We will continue both antibiotics. We will continue gentle IV fluids. We will add Xopenex nebulizers every 6 hours. We will treat her cough with Tussionex and Tessalon Perles.  We will hold beta blocker because of bronchospasms.      Lilyannah Zuelke 11/28/2011, 4:59 PM

## 2011-11-28 NOTE — ED Provider Notes (Signed)
History     CSN: [id number] Arrival date & time: 11/28/2011  9:23 AM   First MD Initiated Contact with Patient 11/28/11 0930      Chief Complaint  Patient presents with  . Cough    (Consider location/radiation/quality/duration/timing/severity/associated sxs/prior treatment) HPI Comments: Patient presents with a 2 week history of nearly nonstop coughing,  Often productive of green sputum.  She is having increased shortness of breath and feels weak and dizzy,  Reporting palpitations when she ambulates.  She has not been able to sleep the past few nights due to near constant cough.  She has had anorexia and poor fluid intake the past 24 hours.  She was seen last week at [facility],  Treated with tussionex which did not relieve her symptoms.  She reports cough is worse when supine.  She denies peripheral edema.  Patient is a 55 y.o. female presenting with cough. The history is provided by the patient.  Cough This is a new problem. The current episode started more than 1 week ago. The problem occurs constantly. The problem has been gradually worsening. The cough is productive of purulent sputum. Associated symptoms include chills, sore throat, myalgias and shortness of breath. Pertinent negatives include no chest pain, no headaches and no rhinorrhea. She has tried cough syrup for the symptoms. The treatment provided no relief. Her past medical history is significant for pneumonia. Her past medical history does not include COPD.    Past Medical History  Diagnosis Date  . Syncope     Neurocardiogenic  . Idiopathic cardiomyopathy     Subsequent normalization of LVEF  . Obesity   . Pneumonia     Past Surgical History  Procedure Date  . Microdiscectomy lumbar 11/10    Right L5-S1   . Left knee arthroscopic surgery   . Abdominal hysterectomy     No family history on file.  History  Substance Use Topics  . Smoking status: Never Smoker   . Smokeless tobacco: Never Used  . Alcohol Use: No     OB History    Grav Para Term Preterm Abortions TAB SAB Ect Mult Living                  Review of Systems  Constitutional: Positive for fever, chills and fatigue.  HENT: Positive for sore throat. Negative for congestion, rhinorrhea and neck pain.   Eyes: Negative.   Respiratory: Positive for cough and shortness of breath. Negative for chest tightness.   Cardiovascular: Positive for palpitations. Negative for chest pain and leg swelling.  Gastrointestinal: Negative for nausea and abdominal pain.  Genitourinary: Negative.   Musculoskeletal: Positive for myalgias. Negative for joint swelling and arthralgias.  Skin: Negative.  Negative for rash and wound.  Neurological: Positive for weakness and light-headedness. Negative for dizziness, numbness and headaches.  Hematological: Negative.   Psychiatric/Behavioral: Negative.     Allergies  Review of patient's allergies indicates no known allergies.  Home Medications  No current outpatient prescriptions on file.  BP 96/58  Pulse 114  Temp(Src) 98.3 F (36.8 C) (Oral)  Resp 20  Ht 5\' 5"  (1.651 m)  Wt 259 lb 14.8 oz (117.9 kg)  BMI 43.25 kg/m2  SpO2 93%  Physical Exam  Nursing note and vitals reviewed. Constitutional: She is oriented to person, place, and time. She appears well-developed and well-nourished.  HENT:  Head: Normocephalic and atraumatic.  Eyes: Conjunctivae are normal.  Neck: Normal range of motion.  Cardiovascular: Regular rhythm, normal heart sounds and  intact distal pulses.  Tachycardia present.   Pulmonary/Chest: Breath sounds normal. She is in respiratory distress. She has no wheezes. She has no rales. She exhibits tenderness.       Frequent cough.  Abdominal: Soft. Bowel sounds are normal. There is no tenderness.  Musculoskeletal: Normal range of motion.  Neurological: She is alert and oriented to person, place, and time.  Skin: Skin is warm and dry.  Psychiatric: She has a normal mood and affect.     ED Course  Procedures (including critical care time)  Labs Reviewed  BASIC METABOLIC PANEL - Abnormal; Notable for the following:    Glucose, Bld 128 (*)    GFR calc non Af Amer 70 (*)    GFR calc Af Amer 81 (*)    All other components within normal limits  CBC - Abnormal; Notable for the following:    WBC 17.9 (*)    All other components within normal limits  DIFFERENTIAL - Abnormal; Notable for the following:    Neutrophils Relative 80 (*)    Neutro Abs 14.3 (*)    Lymphocytes Relative 10 (*)    Monocytes Absolute 1.6 (*)    All other components within normal limits  D-DIMER, QUANTITATIVE - Abnormal; Notable for the following:    D-Dimer, Quant 1.59 (*)    All other components within normal limits  INFLUENZA PANEL BY PCR  TSH  T4, FREE  COMPREHENSIVE METABOLIC PANEL  CBC   Dg Chest 2 View  11/28/2011  *RADIOLOGY REPORT*  Clinical Data: Cough  CHEST - 2 VIEW  Comparison: 11/06/2009  Findings: Left lower lung opacity on the frontal radiograph, without corresponding abnormality on the lateral view, possibly reflecting pneumonia.  Right lung essentially clear.  No effusion  Cardiomediastinal silhouette is within normal limits.  Mild degenerative changes of the visualized thoracolumbar spine.  IMPRESSION: Left lower lung opacity, possibly reflecting pneumonia.  Original Report Authenticated By: Charline Bills, M.D.     1. Pneumonia    Tessalon perles 200 mg given with no improvement in cough.  Pneumonia per cxr.  Rocephin 1 gram IV,  zithromax 500 mg po given.  Patient continues to have tachycardia - given NS 1 liter,  Then ambulated in ed - increased lightheadedness,  Weakness,  Tachycardia to 140 per RN,  sats stayed above 93.     MDM  Pneumonia with tachycardia and fatigue with no improvement with tx in ed.  Call placed to Dr. Sherrie Mustache for admission after discussion with Dr.  Deretha Emory who agreed with need for admission.        Candis Musa, PA 11/28/11 2155

## 2011-11-28 NOTE — ED Notes (Signed)
Pt states she began having cough and congestion approx 2 weeks ago.  Pt states she was seen at Urgent Care.  Pt reports she continues to have productive cough with green phlegm.  Pt reports she feels "woozy" when she is up walking around.  Pt reports difficulty sleeping due to cough.  Pt is alert and oriented x 4 with respirations even and unlabored.  NAD at this time.

## 2011-11-29 ENCOUNTER — Encounter (HOSPITAL_COMMUNITY): Payer: Self-pay | Admitting: Internal Medicine

## 2011-11-29 DIAGNOSIS — D649 Anemia, unspecified: Secondary | ICD-10-CM | POA: Diagnosis not present

## 2011-11-29 DIAGNOSIS — R739 Hyperglycemia, unspecified: Secondary | ICD-10-CM | POA: Diagnosis present

## 2011-11-29 LAB — COMPREHENSIVE METABOLIC PANEL
Albumin: 2.9 g/dL — ABNORMAL LOW (ref 3.5–5.2)
BUN: 11 mg/dL (ref 6–23)
Calcium: 9 mg/dL (ref 8.4–10.5)
Creatinine, Ser: 0.78 mg/dL (ref 0.50–1.10)
GFR calc Af Amer: >90 mL/min (ref >90)
Glucose, Bld: 129 mg/dL — ABNORMAL HIGH (ref 70–99)
Total Protein: 7 g/dL (ref 6.0–8.3)

## 2011-11-29 LAB — CBC
HCT: 36.2 % (ref 36.0–46.0)
MCV: 91.9 fL (ref 78.0–100.0)
RBC: 3.94 MIL/uL (ref 3.87–5.11)
WBC: 9.7 10*3/uL (ref 4.0–10.5)

## 2011-11-29 LAB — HEMOGLOBIN A1C: Hgb A1c MFr Bld: 6.1 % — ABNORMAL HIGH (ref <5.7)

## 2011-11-29 MED ORDER — PREDNISONE 20 MG PO TABS
50.0000 mg | ORAL_TABLET | Freq: Every day | ORAL | Status: AC
  Administered 2011-11-29: 50 mg via ORAL
  Filled 2011-11-29: qty 2

## 2011-11-29 MED ORDER — SODIUM CHLORIDE 0.9 % IJ SOLN
INTRAMUSCULAR | Status: AC
  Filled 2011-11-29: qty 3

## 2011-11-29 NOTE — Progress Notes (Signed)
Subjective: She continues to have a cough. However, she was able to sleep at least 3 hours without any coughing.  Objective: Vital signs in last 24 hours: Filed Vitals:   11/28/11 2124 11/29/11 0028 11/29/11 0548 11/29/11 0842  BP: 96/58  96/63   Pulse: 114  84   Temp: 98.3 F (36.8 C)  98.4 F (36.9 C)   TempSrc: Oral  Oral   Resp: 20  20   Height:      Weight:      SpO2: 93% 94% 95% 95%    Intake/Output Summary (Last 24 hours) at 11/29/11 4098 Last data filed at 11/29/11 0850  Gross per 24 hour  Intake    200 ml  Output      0 ml  Net    200 ml    Weight change:   Physical exam: General: Pleasant Caucasian woman currently sitting up in bed in no acute distress. Lungs: Occasional crackles in the left lower base, otherwise clear anteriorly and in the upper fields. Heart: S1, S2, with mild tachycardia. Abdomen: Obese, positive bowel sounds, nontender, nondistended. Extremities: No pedal edema.  Lab Results: Basic Metabolic Panel:  Basename 11/29/11 0535 11/28/11 1515  NA 139 137  K 4.1 3.6  CL 104 102  CO2 27 26  GLUCOSE 129* 128*  BUN 11 14  CREATININE 0.78 0.91  CALCIUM 9.0 9.5  MG -- --  PHOS -- --   Liver Function Tests:  [facility] 11/29/11 0535  AST 23  ALT 28  ALKPHOS 78  BILITOT 0.6  PROT 7.0  ALBUMIN 2.9*   No results found for this basename: LIPASE:2,AMYLASE:2 in the last 72 hours No results found for this basename: AMMONIA:2 in the last 72 hours CBC:  Basename 11/29/11 0535 11/28/11 1515  WBC 9.7 17.9*  NEUTROABS -- 14.3*  HGB 11.7* 13.3  HCT 36.2 40.5  MCV 91.9 90.2  PLT 241 279   Cardiac Enzymes: No results found for this basename: CKTOTAL:3,CKMB:3,CKMBINDEX:3,TROPONINI:3 in the last 72 hours BNP: No results found for this basename: POCBNP:3 in the last 72 hours D-Dimer:  [facility] 11/28/11 1515  DDIMER 1.59*   CBG: No results found for this basename: GLUCAP:6 in the last 72 hours Hemoglobin A1C: No results found for this  basename: HGBA1C in the last 72 hours Fasting Lipid Panel: No results found for this basename: CHOL,HDL,LDLCALC,TRIG,CHOLHDL,LDLDIRECT in the last 72 hours Thyroid Function Tests: No results found for this basename: TSH,T4TOTAL,FREET4,T3FREE,THYROIDAB in the last 72 hours Anemia Panel: No results found for this basename: VITAMINB12,FOLATE,FERRITIN,TIBC,IRON,RETICCTPCT in the last 72 hours Coagulation: No results found for this basename: LABPROT:2,INR:2 in the last 72 hours Urine Drug Screen: Drugs of Abuse  No results found for this basename: labopia, cocainscrnur, labbenz, amphetmu, thcu, labbarb    Alcohol Level: No results found for this basename: ETH:2 in the last 72 hours   Micro: No results found for this or any previous visit (from the past 240 hour(s)).  Studies/Results: Dg Chest 2 View  11/28/2011  *RADIOLOGY REPORT*  Clinical Data: Cough  CHEST - 2 VIEW  Comparison: 11/06/2009  Findings: Left lower lung opacity on the frontal radiograph, without corresponding abnormality on the lateral view, possibly reflecting pneumonia.  Right lung essentially clear.  No effusion  Cardiomediastinal silhouette is within normal limits.  Mild degenerative changes of the visualized thoracolumbar spine.  IMPRESSION: Left lower lung opacity, possibly reflecting pneumonia.  Original Report Authenticated By: Charline Bills, M.D.    Medications: I have reviewed the patient's  current medications.  Assessment: Principal Problem:  *PNA (pneumonia) Active Problems:  Chest wall pain  History of cardiomyopathy  Tachycardia  Obesity  Cough  Bronchospasm  Anemia  1. Pneumonia, bronchospasms, and associated paroxysmal and persistent coughing. She is currently being treated with Rocephin, azithromycin, oxygen, expectorants, cough suppressants, and albuterol nebulizations. She is wheezing less this morning and appears a bit more comfortable.  Elevated d-dimer. I believe this is secondary to her  pneumonia and less likely from thromboembolic event.  History of cardiomyopathy. Currently stable. Will hold on restarting beta blocker as her blood pressure is on the lower end of normal. Will consider restarting it at a lower dose tomorrow.  Sinus tachycardia. Likely secondary to bronchodilators. She has been hydrated in the ED and gently overnight.  Chest wall pain. This is subsiding.  Anemia. Likely dilutional.  Hyperglycemia. Will check a hemoglobin A1c to assess for diabetes.  Plan:  We'll continue current management. Will give her one dose of prednisone to see if it will help with decreasing the paroxysmal coughing. We'll order hemoglobin A1c. We'll check the results of other laboratory data pending.  LOS: 1 day   Zavian Slowey 11/29/2011, 9:23 AM

## 2011-11-29 NOTE — ED Provider Notes (Signed)
Medical screening examination/treatment/procedure(s) were performed by non-physician practitioner and as supervising physician I was immediately available for consultation/collaboration.   Shelda Jakes, MD 11/29/11 [phone]

## 2011-11-30 MED ORDER — PREDNISONE 20 MG PO TABS
30.0000 mg | ORAL_TABLET | Freq: Two times a day (BID) | ORAL | Status: DC
  Administered 2011-11-30 – 2011-12-01 (×3): 30 mg via ORAL
  Filled 2011-11-30 (×3): qty 1

## 2011-11-30 MED ORDER — SODIUM CHLORIDE 0.9 % IJ SOLN
INTRAMUSCULAR | Status: AC
  Administered 2011-11-30: 11:00:00
  Filled 2011-11-30: qty 3

## 2011-11-30 NOTE — Progress Notes (Signed)
Subjective: She continues to have a cough, both productive and nonproductive. It continues to keep her up at night. She believes that the dose of prednisone given yesterday helped.  Objective: Vital signs in last 24 hours: Filed Vitals:   11/29/11 2024 11/29/11 2153 11/30/11 0646 11/30/11 0748  BP:  100/65 125/84   Pulse:  55 78   Temp:  98.2 F (36.8 C) 98.2 F (36.8 C)   TempSrc:  Oral Oral   Resp:  16 16   Height:      Weight:      SpO2: 96% 95% 95% 92%    Intake/Output Summary (Last 24 hours) at 11/30/11 1059 Last data filed at 11/30/11 0532  Gross per 24 hour  Intake    560 ml  Output      0 ml  Net    560 ml    Weight change:   Physical exam: General: Pleasant Caucasian woman currently sitting up in bed in no acute distress. Lungs: Occasional scattered wheezes. Heart: S1, S2, with mild tachycardia. Abdomen: Obese, positive bowel sounds, nontender, nondistended. Extremities: No pedal edema.  Lab Results: Basic Metabolic Panel:  Basename 11/29/11 0535 11/28/11 1515  NA 139 137  K 4.1 3.6  CL 104 102  CO2 27 26  GLUCOSE 129* 128*  BUN 11 14  CREATININE 0.78 0.91  CALCIUM 9.0 9.5  MG -- --  PHOS -- --   Liver Function Tests:  [facility] 11/29/11 0535  AST 23  ALT 28  ALKPHOS 78  BILITOT 0.6  PROT 7.0  ALBUMIN 2.9*   No results found for this basename: LIPASE:2,AMYLASE:2 in the last 72 hours No results found for this basename: AMMONIA:2 in the last 72 hours CBC:  Basename 11/29/11 0535 11/28/11 1515  WBC 9.7 17.9*  NEUTROABS -- 14.3*  HGB 11.7* 13.3  HCT 36.2 40.5  MCV 91.9 90.2  PLT 241 279   Cardiac Enzymes: No results found for this basename: CKTOTAL:3,CKMB:3,CKMBINDEX:3,TROPONINI:3 in the last 72 hours BNP: No components found with this basename: POCBNP:3 D-Dimer:  Basename 11/28/11 1515  DDIMER 1.59*   CBG: No results found for this basename: GLUCAP:6 in the last 72 hours Hemoglobin A1C:  Basename 11/29/11 0500  HGBA1C 6.1*    Fasting Lipid Panel: No results found for this basename: CHOL,HDL,LDLCALC,TRIG,CHOLHDL,LDLDIRECT in the last 72 hours Thyroid Function Tests:  Basename 11/28/11 1500  TSH 0.620  T4TOTAL --  FREET4 1.33  T3FREE --  THYROIDAB --   Anemia Panel: No results found for this basename: VITAMINB12,FOLATE,FERRITIN,TIBC,IRON,RETICCTPCT in the last 72 hours Coagulation: No results found for this basename: LABPROT:2,INR:2 in the last 72 hours Urine Drug Screen: Drugs of Abuse  No results found for this basename: labopia,  cocainscrnur,  labbenz,  amphetmu,  thcu,  labbarb    Alcohol Level: No results found for this basename: ETH:2 in the last 72 hours   Micro: No results found for this or any previous visit (from the past 240 hour(s)).  Studies/Results: No results found.  Medications: I have reviewed the patient's current medications.  Assessment: Principal Problem:  *PNA (pneumonia) Active Problems:  Chest wall pain  History of cardiomyopathy  Tachycardia  Obesity  Cough  Bronchospasm  Anemia  Hyperglycemia  1. Pneumonia, bronchospasms, and associated paroxysmal and persistent coughing. She is currently being treated with Rocephin, azithromycin, oxygen, expectorants, cough suppressants, and albuterol nebulizations. She still has occasional wheezes and will give more prednisone at a lower dose. Overall, clinically she appears improved. We'll hold off  on starting beta blockers to bronchospasms.  Elevated d-dimer. I believe this is secondary to her pneumonia and less likely from thromboembolic event.  History of cardiomyopathy. Currently stable. Will hold on restarting beta blocker as her blood pressure is on the lower end of normal. Will consider restarting it at a lower dose.  Sinus tachycardia. Likely secondary to bronchodilators. She has been hydrated in the ED and gently overnight.  Chest wall pain. This is subsiding.  Anemia. Likely dilutional.  Hyperglycemia. With  a hemoglobin A1c of 6.1, it is likely that she is borderline diabetic. She was informed of this. We'll therefore change her diet to a carbohydrate modified diet. I encouraged her to lose at least 10% of her body weight to avoid overt diabetes mellitus.  Plan: We'll add prednisone 30 mg twice a day. Continue other management except will saline lock her IV. Encouraged increase in activity. Her diet was changed to a carbohydrate modified diet.   LOS: 2 days   Wrenly Lauritsen 11/30/2011, 10:59 AM

## 2011-12-01 LAB — CBC
MCH: 30 pg (ref 26.0–34.0)
Platelets: 277 10*3/uL (ref 150–400)
RBC: 4.24 MIL/uL (ref 3.87–5.11)
RDW: 13.5 % (ref 11.5–15.5)
WBC: 12.4 10*3/uL — ABNORMAL HIGH (ref 4.0–10.5)

## 2011-12-01 LAB — BASIC METABOLIC PANEL
CO2: 29 mEq/L (ref 19–32)
Calcium: 10 mg/dL (ref 8.4–10.5)
Chloride: 100 mEq/L (ref 96–112)
Creatinine, Ser: 0.62 mg/dL (ref 0.50–1.10)
GFR calc Af Amer: >90 mL/min (ref >90)
Sodium: 136 mEq/L (ref 135–145)

## 2011-12-01 MED ORDER — BUDESONIDE-FORMOTEROL FUMARATE 80-4.5 MCG/ACT IN AERO: 2.0000 | INHALATION_SPRAY | Freq: Two times a day (BID) | RESPIRATORY_TRACT | Status: DC

## 2011-12-01 MED ORDER — BENZONATATE 200 MG PO CAPS: 200.0000 mg | ORAL_CAPSULE | Freq: Three times a day (TID) | ORAL | Status: DC

## 2011-12-01 MED ORDER — CARVEDILOL 6.25 MG PO TABS: 6.2500 mg | ORAL_TABLET | Freq: Two times a day (BID) | ORAL | Status: DC

## 2011-12-01 MED ORDER — ALBUTEROL SULFATE HFA 108 (90 BASE) MCG/ACT IN AERS: 2.0000 | INHALATION_SPRAY | Freq: Four times a day (QID) | RESPIRATORY_TRACT | Status: DC | PRN

## 2011-12-01 MED ORDER — BENZONATATE 200 MG PO CAPS: 200.0000 mg | ORAL_CAPSULE | Freq: Three times a day (TID) | ORAL | Status: AC

## 2011-12-01 MED ORDER — HYDROCOD POLST-CHLORPHEN POLST 10-8 MG/5ML PO LQCR: 5.0000 mL | Freq: Two times a day (BID) | ORAL | Status: DC

## 2011-12-01 MED ORDER — GUAIFENESIN ER 600 MG PO TB12: 600.0000 mg | ORAL_TABLET | Freq: Two times a day (BID) | ORAL | Status: DC

## 2011-12-01 MED ORDER — ALBUTEROL SULFATE HFA 108 (90 BASE) MCG/ACT IN AERS
2.0000 | INHALATION_SPRAY | RESPIRATORY_TRACT | Status: DC
  Administered 2011-12-01: 2 via RESPIRATORY_TRACT
  Filled 2011-12-01: qty 6.7

## 2011-12-01 MED ORDER — BUDESONIDE-FORMOTEROL FUMARATE 80-4.5 MCG/ACT IN AERO
2.0000 | INHALATION_SPRAY | Freq: Two times a day (BID) | RESPIRATORY_TRACT | Status: DC
Start: 2011-12-01 — End: 2011-12-01
  Administered 2011-12-01: 2 via RESPIRATORY_TRACT
  Filled 2011-12-01: qty 6.9

## 2011-12-01 MED ORDER — AZITHROMYCIN 250 MG PO TABS: ORAL_TABLET | ORAL | Status: AC

## 2011-12-01 MED ORDER — PREDNISONE 10 MG PO TABS: ORAL_TABLET | ORAL | Status: DC

## 2011-12-01 MED ORDER — AZITHROMYCIN 250 MG PO TABS: ORAL_TABLET | ORAL | Status: DC

## 2011-12-01 NOTE — Discharge Summary (Signed)
Physician Discharge Summary  KHALI PERELLA MRN: [id number] DOB/AGE: Dec 29, 1955 55 y.o.  PCP: Louie Boston, MD   Admit date: 11/28/2011 Discharge date: 12/01/2011  Discharge Diagnoses:  1. Left lower lobe pneumonia with associated acute bronchitis. 2. Persistent paroxysmal coughing. 3. Prediabetes with a hemoglobin A1c of 6.1. 4. Chest wall pain from coughing. 5. Hypertension, well controlled off of beta blockers. 6. History of nonischemic cardiomyopathy. 7. Mild transient dilutional anemia. 8. Normal thyroid function with a TSH of 0.62 and free T4 of 1.33.    Current Discharge Medication List    START taking these medications   Details  albuterol (PROVENTIL HFA;VENTOLIN HFA) 108 (90 BASE) MCG/ACT inhaler Inhale 2 puffs into the lungs every 6 (six) hours as needed for wheezing or shortness of breath. Qty: 1 Inhaler, Refills: 1    azithromycin (ZITHROMAX Z-PAK) 250 MG tablet TAKE 2 TABLETS STARTING TOMORROW FOR 1 DAY; THEN 1 TABLET DAILY UNTIL COMPLETED. Qty: 6 tablet, Refills: 0    benzonatate (TESSALON) 200 MG capsule Take 1 capsule (200 mg total) by mouth 3 (three) times daily. Qty: 20 capsule, Refills: 0    budesonide-formoterol (SYMBICORT) 80-4.5 MCG/ACT inhaler Inhale 2 puffs into the lungs 2 (two) times daily. Qty: 1 Inhaler, Refills: 1    guaiFENesin (MUCINEX) 600 MG 12 hr tablet Take 1 tablet (600 mg total) by mouth 2 (two) times daily.    predniSONE (DELTASONE) 10 MG tablet TAKE 5 TABLETS FOR 1 DAY STARTING TOMORROW; THEN 4 TABLETS THE NEXT DAY; THEN 3 TABLETS THE NEXT DAY; THEN 2 TABLETS THE NEXT DAY; THEN 1 TABLET THE NEXT DAY; THEN STOP. Qty: 15 tablet, Refills: 0      CONTINUE these medications which have CHANGED   Details  carvedilol (COREG) 6.25 MG tablet Take 1 tablet (6.25 mg total) by mouth 2 (two) times daily. RESTART IN 5 DAYS. Qty: 60 tablet, Refills: 6    chlorpheniramine-HYDROcodone (TUSSIONEX) 10-8 MG/5ML LQCR Take 5 mLs by mouth every  12 (twelve) hours. Cough Qty: 140 mL, Refills: 0      CONTINUE these medications which have NOT CHANGED   Details  naproxen sodium (ALEVE) 220 MG tablet Take 220 mg by mouth daily as needed. Pain      STOP taking these medications     atenolol (TENORMIN) 100 MG tablet         Discharge Condition: Improved and stable.  Disposition: Home or Self Care   Consults: None.   Significant Diagnostic Studies: Dg Chest 2 View  11/28/2011  *RADIOLOGY REPORT*  Clinical Data: Cough  CHEST - 2 VIEW  Comparison: 11/06/2009  Findings: Left lower lung opacity on the frontal radiograph, without corresponding abnormality on the lateral view, possibly reflecting pneumonia.  Right lung essentially clear.  No effusion  Cardiomediastinal silhouette is within normal limits.  Mild degenerative changes of the visualized thoracolumbar spine.  IMPRESSION: Left lower lung opacity, possibly reflecting pneumonia.  Original Report Authenticated By: Charline Bills, M.D.     Microbiology: No results found for this or any previous visit (from the past 240 hour(s)).   Labs: Results for orders placed during the hospital encounter of 11/28/11 (from the past 48 hour(s))  CBC     Status: Abnormal   Collection Time   12/01/11  5:59 AM      Component Value Range Comment   WBC 12.4 (*) 4.0 - 10.5 (K/uL)    RBC 4.24  3.87 - 5.11 (MIL/uL)    Hemoglobin 12.7  12.0 -  15.0 (g/dL)    HCT 16.1  09.6 - 04.5 (%)    MCV 91.3  78.0 - 100.0 (fL)    MCH 30.0  26.0 - 34.0 (pg)    MCHC 32.8  30.0 - 36.0 (g/dL)    RDW 40.9  81.1 - 91.4 (%)    Platelets 277  150 - 400 (K/uL)   BASIC METABOLIC PANEL     Status: Abnormal   Collection Time   12/01/11  5:59 AM      Component Value Range Comment   Sodium 136  135 - 145 (mEq/L)    Potassium 5.1  3.5 - 5.1 (mEq/L)    Chloride 100  96 - 112 (mEq/L)    CO2 29  19 - 32 (mEq/L)    Glucose, Bld 157 (*) 70 - 99 (mg/dL)    BUN 12  6 - 23 (mg/dL)    Creatinine, Ser 7.82  0.50 -  1.10 (mg/dL)    Calcium 95.6  8.4 - 10.5 (mg/dL)    GFR calc non Af Amer >90  >90 (mL/min)    GFR calc Af Amer >90  >90 (mL/min)      HPI : The patient is a 55 year old woman with a past medical history significant for idiopathic cardiomyopathy, who presented to the emergency department on 11/28/2011 with a chief complaint of cough, chest congestion, and shortness of breath. She was recently treated for the influenza virus. In the emergency department, she was noted to be mildly tachycardic and afebrile. Her chest x-ray revealed left lower lobe opacity, possibly pneumonia. Her white blood cell count was elevated at 17.9. She was admitted for further evaluation and management.  HOSPITAL COURSE: The patient was given 1 L of IV fluids in the emergency department. She was continued on gentle IV fluids for hydration. Rocephin and azithromycin were started intravenously for treatment of pneumonia. Xopenex nebulizers were scheduled every 6 hours for acute and subacute bronchospasms consistent with superimposed acute bronchitis. She was also started on treatment with Tussionex and Tessalon Perles for persistent coughing. Atenolol and Coreg were withheld because of the bronchospasms. The patient improved mildly each day, however, she continued to have a congested cough which was persistent and distressing to her to the point that she was having difficulty sleeping. Prednisone was subsequently started in hopes to decrease the bronchospasms and to calm her cough. Eventually, Symbicort was added twice a day.  Her EKG revealed sinus tachycardia with PVCs and a heart rate of 106 beats per minute. Her d-dimer was elevated, however the elevation was thought to be secondary to pneumonia and acute bronchitis and less likely secondary to thromboembolism. With treatment, her tachycardia did resolve, but however, it did increase again with ongoing bronchodilator therapy. Her blood pressure which was low normal on admission  began to increase toward the end of the hospital course. She was advised to restart carvedilol in 5 days.  The patient's venous glucose was noted to be mildly elevated each morning. A hemoglobin A1c was ordered and it was 6.1. The patient was informed that she had prediabetes. Her diet was changed to a medium carbohydrate modified diet. She was encouraged to lose at least 10% of her body weight to help decrease the risk of full blown diabetes. She was given some information on diabetes and a carbohydrate modified diet prior to discharge.   The patient did improve symptomatically and clinically. She continued to have a persistent cough and rare wheezes on exam. She was discharged to  home on a prednisone taper, azithromycin, albuterol inhaler, Symbicort inhaler, and cough suppressants. She will followup with her primary care physician in one week as scheduled.   Discharge Exam:  Blood pressure 133/89, pulse 107, temperature 98.2 F (36.8 C), temperature source Oral, resp. rate 17, height 5\' 5"  (1.651 m), weight 117.9 kg (259 lb 14.8 oz), SpO2 95.00%.  Lungs: Only rare wheeze auscultated. Heart: S1, S2, with no murmurs rubs or gallops. Abdomen: Obese, positive bowel sounds, nontender, nondistended. Extremities: No pedal edema.   Discharge Orders    Future Orders Please Complete By Expires   Diet - low sodium heart healthy      Diet Carb Modified      Increase activity slowly      Discharge instructions      Comments:   TAKE MEDICATIONS AS PRESCRIBED. AVOID SICK CONTACTS.      Follow-up Information    Follow up with TAPPER,DAVID B on 12/08/2011. (AT 11:30 AM)          Discharge time: 35 minutes.    Signed: Loukas Antonson 12/01/2011, 2:57 PM

## 2011-12-01 NOTE — Progress Notes (Signed)
Writer discussed discharge instructions and medications to pt, verbalized understanding.  Care notes given on new scripts and reviewed.  Encouraged to call with any questions that may arise.  Writer reviewed diabetes and diabetic meal planning with pt, verbalized understanding.  Handouts given and reviewed as well.  Pt took all belongings and pt was wheel chaired out instable condition and in no distress.

## 2011-12-02 NOTE — Progress Notes (Signed)
Utilization review completed.  

## 2011-12-28 ENCOUNTER — Other Ambulatory Visit (HOSPITAL_COMMUNITY): Payer: Self-pay | Admitting: Pulmonary Disease

## 2011-12-28 ENCOUNTER — Ambulatory Visit (HOSPITAL_COMMUNITY)
Admission: RE | Admit: 2011-12-28 | Discharge: 2011-12-28 | Disposition: A | Discharge status: Home / Self Care | Payer: BC Managed Care – PPO | Source: Ambulatory Visit | Attending: Pulmonary Disease | Admitting: Pulmonary Disease

## 2011-12-28 DIAGNOSIS — J189 Pneumonia, unspecified organism: Secondary | ICD-10-CM | POA: Insufficient documentation

## 2011-12-28 DIAGNOSIS — R059 Cough, unspecified: Secondary | ICD-10-CM | POA: Insufficient documentation

## 2011-12-28 DIAGNOSIS — R05 Cough: Secondary | ICD-10-CM | POA: Insufficient documentation

## 2012-05-29 ENCOUNTER — Encounter: Payer: Self-pay | Admitting: Cardiology

## 2012-05-29 ENCOUNTER — Ambulatory Visit (INDEPENDENT_AMBULATORY_CARE_PROVIDER_SITE_OTHER): Payer: BC Managed Care – PPO | Admitting: Cardiology

## 2012-05-29 VITALS — BP 127/96 | HR 111 | Ht 65.0 in | Wt 273.0 lb

## 2012-05-29 DIAGNOSIS — Z8669 Personal history of other diseases of the nervous system and sense organs: Secondary | ICD-10-CM

## 2012-05-29 DIAGNOSIS — I429 Cardiomyopathy, unspecified: Secondary | ICD-10-CM

## 2012-05-29 DIAGNOSIS — E669 Obesity, unspecified: Secondary | ICD-10-CM

## 2012-05-29 DIAGNOSIS — I428 Other cardiomyopathies: Secondary | ICD-10-CM

## 2012-05-29 MED ORDER — BISOPROLOL FUMARATE 5 MG PO TABS: 5.0000 mg | ORAL_TABLET | Freq: Every day | ORAL | Status: DC

## 2012-05-29 NOTE — Assessment & Plan Note (Signed)
She states she has lost approximately 10 pounds through diet.

## 2012-05-29 NOTE — Assessment & Plan Note (Signed)
History of nonischemic cardiomyopathy, previous LVEF 40%. We discussed the importance of further optimization of medical therapy. She did not tolerate Coreg secondary to cough. Will try bisoprolol 5 mg daily, continue Cozaar. Follow up arranged with echocardiogram in 3 months.

## 2012-05-29 NOTE — Assessment & Plan Note (Signed)
History of vasovagal syncope, not recurrent.

## 2012-05-29 NOTE — Patient Instructions (Addendum)
Your physician recommends that you schedule a follow-up appointment in: 3 months with Dr. Diona Browner.  Your physician has recommended you make the following change in your medication: start bisoprolol 5 mg daily. Your new prescription has been sent to your pharmacy.  Your physician has requested that you have an echocardiogram in 3 months just before your next appointment. Echocardiography is a painless test that uses sound waves to create images of your heart. It provides your doctor with information about the size and shape of your heart and how well your heart's chambers and valves are working. This procedure takes approximately one hour. There are no restrictions for this procedure.

## 2012-05-29 NOTE — Progress Notes (Signed)
   Clinical Summary Dawn Weiss is a 56 y.o.female presenting for followup. She was seen in June of 2012. Record review finds hospitalization back in December with pneumonia. She has come off of Coreg since that time, felt that it was causing a cough. She has been taking Cozaar.  Still describes NYHA class 2-3 dyspnea on exertion. No chest pain. No syncope.  We reviewed her prior diagnosis of cardiomyopathy, discussed importance of medical therapy. She has also been working on weight loss through diet. Exercise is limited by knee pain.   No Known Allergies  Current Outpatient Prescriptions  Medication Sig Dispense Refill  . bisoprolol (ZEBETA) 5 MG tablet Take 1 tablet (5 mg total) by mouth daily.  30 tablet  3  . losartan (COZAAR) 25 MG tablet Take 1 tablet by mouth Daily.      . naproxen sodium (ALEVE) 220 MG tablet Take 220 mg by mouth daily as needed. Pain      . DISCONTD: bisoprolol (ZEBETA) 5 MG tablet Take 5 mg by mouth daily.        Past Medical History  Diagnosis Date  . Syncope     Neurocardiogenic  . Idiopathic cardiomyopathy     Subsequent normalization of LVEF  . Obesity   . Pneumonia   . Anemia 11/29/2011  . Hyperglycemia 11/29/2011    Social History Dawn Weiss reports that she has never smoked. She has never used smokeless tobacco. Dawn Weiss reports that she does not drink alcohol.  Review of Systems No reported bleeding problems. No wheezing. No orthopnea. Still working full time. Otherwise negative.  Physical Examination Filed Vitals:   05/29/12 1307  BP: 127/96  Pulse: 111    Obese woman in no acute distress.  HEENT: Conjunctiva and lids normal, oropharynx clear.  Neck: Supple, no elevated sugar venous pressure, no bruits.  Lungs: Clear to auscultation, nonlabored.  Cardiac: Indistinct PMI, regular rate and rhythm, no loud systolic murmur or S3.  Abdomen: Obese, bowel sounds present.  Extremities: Trace edema below the knees, symmetrical, distal  pulses one plus.  Skin: Warm and dry.  Musculoskeletal: No kyphosis.  Neuropsychiatric: Alert and oriented x3, affect appropriate.   ECG Reviewed in EMR showing sinus tachycardia with PVCs and fusion beats.  Problem List and Plan   Secondary cardiomyopathy, unspecified History of nonischemic cardiomyopathy, previous LVEF 40%. We discussed the importance of further optimization of medical therapy. She did not tolerate Coreg secondary to cough. Will try bisoprolol 5 mg daily, continue Cozaar. Follow up arranged with echocardiogram in 3 months.  Obesity She states she has lost approximately 10 pounds through diet.  SYNCOPE, HX OF History of vasovagal syncope, not recurrent.     [name], M.D., F.A.C.C.

## 2012-06-08 ENCOUNTER — Other Ambulatory Visit: Payer: Self-pay | Admitting: Cardiology

## 2012-06-08 MED ORDER — BISOPROLOL FUMARATE 5 MG PO TABS: 5.0000 mg | ORAL_TABLET | Freq: Every day | ORAL | Status: DC

## 2012-06-08 MED ORDER — LOSARTAN POTASSIUM 25 MG PO TABS: 25.0000 mg | ORAL_TABLET | Freq: Every day | ORAL | Status: DC

## 2012-08-30 ENCOUNTER — Other Ambulatory Visit: Payer: Self-pay

## 2012-08-30 ENCOUNTER — Other Ambulatory Visit (INDEPENDENT_AMBULATORY_CARE_PROVIDER_SITE_OTHER): Payer: BC Managed Care – PPO

## 2012-08-30 DIAGNOSIS — I429 Cardiomyopathy, unspecified: Secondary | ICD-10-CM

## 2012-08-30 DIAGNOSIS — I428 Other cardiomyopathies: Secondary | ICD-10-CM

## 2012-09-05 ENCOUNTER — Telehealth: Payer: Self-pay | Admitting: *Deleted

## 2012-09-05 NOTE — Telephone Encounter (Signed)
Patient informed. 

## 2012-09-05 NOTE — Telephone Encounter (Signed)
Message copied by Eustace Moore on Tue Sep 05, 2012  1:41 PM ------      Message from: MCDOWELL, Illene Bolus      Created: Wed Aug 30, 2012  1:34 PM       Study shows stable LV dysfunction with LVEF 40%. Need to continue medical therapy and observation.

## 2012-09-13 ENCOUNTER — Encounter: Payer: Self-pay | Admitting: Cardiology

## 2012-09-13 ENCOUNTER — Ambulatory Visit (INDEPENDENT_AMBULATORY_CARE_PROVIDER_SITE_OTHER): Payer: BC Managed Care – PPO | Admitting: Cardiology

## 2012-09-13 VITALS — BP 122/79 | HR 61 | Ht 65.0 in | Wt 280.0 lb

## 2012-09-13 DIAGNOSIS — R55 Syncope and collapse: Secondary | ICD-10-CM | POA: Insufficient documentation

## 2012-09-13 DIAGNOSIS — I429 Cardiomyopathy, unspecified: Secondary | ICD-10-CM

## 2012-09-13 NOTE — Assessment & Plan Note (Signed)
No recurring episodes.

## 2012-09-13 NOTE — Patient Instructions (Addendum)
Your physician recommends that you schedule a follow-up appointment in: 4 months. You will receive a reminder letter in the mail in about 1-2 months reminding you to call and schedule your appointment. If you don't receive this letter, please contact our office. Your physician recommends that you continue on your current medications as directed. Please refer to the Current Medication list given to you today. 

## 2012-09-13 NOTE — Assessment & Plan Note (Signed)
LVEF back to the range of 40% as noted above. We discussed the importance of regular medical therapy, now back on both beta blocker and ARB. Work toward more regular exercise regimen. She is symptomatically stable this time. Followup arranged.

## 2012-09-13 NOTE — Progress Notes (Signed)
   Clinical Summary Ms. Titsworth is a 56 y.o.female presenting for followup. She was seen in June. She had a followup echocardiogram done recently showing upper normal wall thickness with mildly dilated LV, LVEF 40% with hypokinesis, rate to diastolic dysfunction, trivial mitral regurgitation, mild left atrial enlargement, no significant pulmonary hypertension. We discuss these results. She states that she has been trying to take her medications as regularly as possible, forgets them only occasionally. We discussed the importance of continuing medical therapy, also tring to establish a more routine exercise regimen.  She reports no chest pain, NYHA class II dyspnea on exertion based on description of activities. She is limited by chronic knee pain/arthritis.   No Known Allergies  Current Outpatient Prescriptions  Medication Sig Dispense Refill  . acetaminophen (TYLENOL) 500 MG tablet Take 500 mg by mouth every 6 (six) hours as needed.      . bisoprolol (ZEBETA) 5 MG tablet Take 1 tablet (5 mg total) by mouth daily.  90 tablet  3  . losartan (COZAAR) 25 MG tablet Take 1 tablet (25 mg total) by mouth daily.  90 tablet  3  . naproxen sodium (ALEVE) 220 MG tablet Take 220 mg by mouth daily as needed. Pain        Past Medical History  Diagnosis Date  . Syncope     Neurocardiogenic  . Idiopathic cardiomyopathy     Subsequent normalization of LVEF  . Obesity   . Pneumonia     Social History Ms. Arakelian reports that she has never smoked. She has never used smokeless tobacco. Ms. Schlafer reports that she does not drink alcohol.  Review of Systems No palpitations or syncope. No orthopnea or PND. Otherwise negative.  Physical Examination Filed Vitals:   09/13/12 0812  BP: 122/79  Pulse: 61   Filed Weights   09/13/12 0812  Weight: 280 lb (127.007 kg)   HEENT: Conjunctiva and lids normal, oropharynx clear.  Neck: Supple, no elevated sugar venous pressure, no bruits.  Lungs: Clear to  auscultation, nonlabored.  Cardiac: Indistinct PMI, regular rate and rhythm, no loud systolic murmur or S3.  Abdomen: Obese, bowel sounds present.  Extremities: Trace edema below the knees, symmetrical, distal pulses one plus.     Problem List and Plan   Secondary cardiomyopathy, unspecified LVEF back to the range of 40% as noted above. We discussed the importance of regular medical therapy, now back on both beta blocker and ARB. Work toward more regular exercise regimen. She is symptomatically stable this time. Followup arranged.  Vasovagal syncope No recurring episodes.    [name], M.D., F.A.C.C.

## 2013-06-25 ENCOUNTER — Other Ambulatory Visit: Payer: Self-pay | Admitting: Cardiology

## 2013-06-25 ENCOUNTER — Other Ambulatory Visit: Payer: Self-pay | Admitting: *Deleted

## 2013-06-25 MED ORDER — LOSARTAN POTASSIUM 25 MG PO TABS: 25.0000 mg | ORAL_TABLET | Freq: Every day | ORAL | Status: DC

## 2013-06-25 NOTE — Telephone Encounter (Signed)
Medication sent via escribe.  

## 2013-07-28 ENCOUNTER — Emergency Department (HOSPITAL_COMMUNITY)
Admission: EM | Admit: 2013-07-28 | Discharge: 2013-07-28 | Disposition: A | Discharge status: Home / Self Care | Payer: BC Managed Care – PPO | Attending: Emergency Medicine | Admitting: Emergency Medicine

## 2013-07-28 ENCOUNTER — Emergency Department (HOSPITAL_COMMUNITY): Payer: BC Managed Care – PPO

## 2013-07-28 ENCOUNTER — Encounter (HOSPITAL_COMMUNITY): Payer: Self-pay | Admitting: *Deleted

## 2013-07-28 DIAGNOSIS — Z79899 Other long term (current) drug therapy: Secondary | ICD-10-CM | POA: Insufficient documentation

## 2013-07-28 DIAGNOSIS — E669 Obesity, unspecified: Secondary | ICD-10-CM | POA: Insufficient documentation

## 2013-07-28 DIAGNOSIS — S335XXA Sprain of ligaments of lumbar spine, initial encounter: Secondary | ICD-10-CM | POA: Insufficient documentation

## 2013-07-28 DIAGNOSIS — Z8701 Personal history of pneumonia (recurrent): Secondary | ICD-10-CM | POA: Insufficient documentation

## 2013-07-28 DIAGNOSIS — Y929 Unspecified place or not applicable: Secondary | ICD-10-CM | POA: Insufficient documentation

## 2013-07-28 DIAGNOSIS — Y9389 Activity, other specified: Secondary | ICD-10-CM | POA: Insufficient documentation

## 2013-07-28 DIAGNOSIS — Z8679 Personal history of other diseases of the circulatory system: Secondary | ICD-10-CM | POA: Insufficient documentation

## 2013-07-28 DIAGNOSIS — Z791 Long term (current) use of non-steroidal anti-inflammatories (NSAID): Secondary | ICD-10-CM | POA: Insufficient documentation

## 2013-07-28 DIAGNOSIS — X500XXA Overexertion from strenuous movement or load, initial encounter: Secondary | ICD-10-CM | POA: Insufficient documentation

## 2013-07-28 DIAGNOSIS — S39012A Strain of muscle, fascia and tendon of lower back, initial encounter: Secondary | ICD-10-CM

## 2013-07-28 DIAGNOSIS — IMO0002 Reserved for concepts with insufficient information to code with codable children: Secondary | ICD-10-CM | POA: Insufficient documentation

## 2013-07-28 MED ORDER — PREDNISONE 10 MG PO TABS: ORAL_TABLET | ORAL | Status: DC

## 2013-07-28 MED ORDER — HYDROMORPHONE HCL PF 1 MG/ML IJ SOLN
1.0000 mg | Freq: Once | INTRAMUSCULAR | Status: AC
  Administered 2013-07-28: 1 mg via INTRAMUSCULAR
  Filled 2013-07-28: qty 1

## 2013-07-28 MED ORDER — HYDROCODONE-ACETAMINOPHEN 5-325 MG PO TABS: 1.0000 | ORAL_TABLET | ORAL | Status: DC | PRN

## 2013-07-28 MED ORDER — METHOCARBAMOL 500 MG PO TABS: 1000.0000 mg | ORAL_TABLET | Freq: Four times a day (QID) | ORAL | Status: AC

## 2013-07-28 MED ORDER — PREDNISONE 50 MG PO TABS
60.0000 mg | ORAL_TABLET | Freq: Once | ORAL | Status: AC
  Administered 2013-07-28: 60 mg via ORAL
  Filled 2013-07-28: qty 1

## 2013-07-28 NOTE — ED Notes (Signed)
Pt c/o lower back pain that started Tuesday after she heard a "pop" after pushing a clutch in on a truck,

## 2013-07-28 NOTE — ED Provider Notes (Signed)
CSN: [id number]     Arrival date & time 07/28/13  0941 History     First MD Initiated Contact with Patient 07/28/13 [phone]     Chief Complaint  Patient presents with  . Back Pain   (Consider location/radiation/quality/duration/timing/severity/associated sxs/prior Treatment) HPI Comments: Dawn Weiss is a 57 y.o. Female with a history of occasional low back pain and a history of lumbar microdiskectomy 4 years ago,  Was pushing the clutch in on her husband truck 4 days ago when she felt a sudden "pop" in her lower back.  She has been resting,  using ice and heating pad along with Aleve with no relief,  In fact, having worsening pain today.   There is no radiation of pain, numbness or weakness into the lower extremities and no urinary or bowel retention or incontinence.  Pain is persistent ans worsened with movement,  Even simply trying to roll over in bed is very painful.  She has not contacted her pcp or her neurosurgeon regarding this new pain.    The history is provided by the patient.    Past Medical History  Diagnosis Date  . Syncope     Neurocardiogenic  . Idiopathic cardiomyopathy     Subsequent normalization of LVEF  . Obesity   . Pneumonia    Past Surgical History  Procedure Laterality Date  . Microdiscectomy lumbar  11/10    Right L5-S1   . Left knee arthroscopic surgery    . Abdominal hysterectomy     No family history on file. History  Substance Use Topics  . Smoking status: Never Smoker   . Smokeless tobacco: Never Used  . Alcohol Use: No   OB History   Grav Para Term Preterm Abortions TAB SAB Ect Mult Living                 Review of Systems  Constitutional: Negative for fever.  Respiratory: Negative for shortness of breath.   Cardiovascular: Negative for chest pain and leg swelling.  Gastrointestinal: Negative for abdominal pain, constipation and abdominal distention.  Genitourinary: Negative for dysuria, urgency, frequency, flank pain and difficulty  urinating.  Musculoskeletal: Positive for back pain. Negative for joint swelling and gait problem.  Skin: Negative for rash.  Neurological: Negative for weakness and numbness.    Allergies  Review of patient's allergies indicates no known allergies.  Home Medications   Current Outpatient Rx  Name  Route  Sig  Dispense  Refill  . bisoprolol (ZEBETA) 5 MG tablet   Oral   Take 5 mg by mouth daily.         Marland Kitchen losartan (COZAAR) 25 MG tablet   Oral   Take 1 tablet (25 mg total) by mouth daily.   90 tablet   0     NEEDS APPOINTMENT FOR NEXT REFILL   . naproxen sodium (ALEVE) 220 MG tablet   Oral   Take 220 mg by mouth daily as needed. Pain         . HYDROcodone-acetaminophen (NORCO/VICODIN) 5-325 MG per tablet   Oral   Take 1 tablet by mouth every 4 (four) hours as needed for pain.   20 tablet   0   . methocarbamol (ROBAXIN) 500 MG tablet   Oral   Take 2 tablets (1,000 mg total) by mouth 4 (four) times daily.   40 tablet   0   . predniSONE (DELTASONE) 10 MG tablet      6, 5, 4, 3, 2  then 1 tablet by mouth daily for 6 days total.   21 tablet   0    BP 146/81  Pulse 79  Temp(Src) 98.2 F (36.8 C)  Resp 20  SpO2 99% Physical Exam  Nursing note and vitals reviewed. Constitutional: She appears well-developed and well-nourished.  HENT:  Head: Normocephalic.  Eyes: Conjunctivae are normal.  Neck: Normal range of motion. Neck supple.  Cardiovascular: Normal rate and intact distal pulses.   Pedal pulses normal.  Pulmonary/Chest: Effort normal.  Abdominal: Soft. Bowel sounds are normal. She exhibits no distension and no mass.  Musculoskeletal: Normal range of motion. She exhibits no edema.       Lumbar back: She exhibits tenderness and bony tenderness. She exhibits no swelling, no edema and no spasm.  ttp lumbar and bilateral paralumbar soft tissue.  Neurological: She is alert. She has normal strength. She displays no atrophy and no tremor. No sensory deficit.  Gait normal.  Reflex Scores:      Patellar reflexes are 2+ on the right side and 2+ on the left side.      Achilles reflexes are 2+ on the right side and 2+ on the left side. No strength deficit noted in hip and knee flexor and extensor muscle groups.  Ankle flexion and extension intact.  Skin: Skin is warm and dry.  Psychiatric: She has a normal mood and affect.    ED Course   Procedures (including critical care time)  Labs Reviewed - No data to display Dg Lumbar Spine Complete  07/28/2013   *RADIOLOGY REPORT*  Clinical Data: 57 year old female low back pain.  LUMBAR SPINE - COMPLETE 4+ VIEW  Comparison: 10/30/2009 and  Findings: Five non-rib bearing lumbar type vertebra are again identified in normal alignment. Mild multilevel degenerative disc disease identified - unchanged. There is no evidence of acute fracture or subluxation. No focal bony lesions are present. There is no evidence of spondylolysis.  IMPRESSION: No evidence of acute bony abnormality.  Unchanged mild multilevel degenerative disc disease.   Original Report Authenticated By: Harmon Pier, M.D.   1. Lumbar strain, initial encounter     MDM  Patients labs and/or radiological studies were viewed and considered during the medical decision making and disposition process.  No neuro deficit on exam or by history to suggest emergent or surgical presentation.  Also discussed worsened sx that should prompt immediate re-evaluation including distal weakness, bowel/bladder retention/incontinence.  Discussed stable finding of xray with patient,  And degenerative disk changes,  But no new acute injury per plain films.  Advised rest, ice, avoid activity that worsens pain.  Pt was given dilaudid 1 mg IM injection, prednisone 60 mg PO.  She was getting pain relief by time of dc.  Prescribed oxycodone,  Prednisone taper, encouraged f/u care with Dr. Venetia Maxon,  Her neurosurgeon if her sx persist beyond the next 5-7 days.  Burgess Amor, PA-C 07/29/13  2340

## 2013-07-28 NOTE — ED Notes (Signed)
States that she felt a pop in her left lower back about 5 days ago and has been having pain in the same location since then.  States that her pain is worse today.

## 2013-08-01 NOTE — ED Provider Notes (Signed)
Medical screening examination/treatment/procedure(s) were performed by non-physician practitioner and as supervising physician I was immediately available for consultation/collaboration. Reynol Arnone, MD, FACEP   Mehran Guderian L Dannie Hattabaugh, MD 08/01/13 1138 

## 2013-08-31 ENCOUNTER — Other Ambulatory Visit: Payer: Self-pay | Admitting: Cardiology

## 2014-02-06 ENCOUNTER — Ambulatory Visit: Payer: BC Managed Care – PPO | Admitting: Cardiology

## 2014-08-21 ENCOUNTER — Encounter: Payer: Self-pay | Admitting: Cardiology

## 2014-08-21 ENCOUNTER — Ambulatory Visit (INDEPENDENT_AMBULATORY_CARE_PROVIDER_SITE_OTHER): Payer: BC Managed Care – PPO | Admitting: Cardiology

## 2014-08-21 VITALS — BP 121/85 | HR 70 | Ht 64.0 in | Wt 274.0 lb

## 2014-08-21 DIAGNOSIS — R55 Syncope and collapse: Secondary | ICD-10-CM

## 2014-08-21 DIAGNOSIS — I429 Cardiomyopathy, unspecified: Secondary | ICD-10-CM

## 2014-08-21 MED ORDER — LOSARTAN POTASSIUM 25 MG PO TABS: 25.0000 mg | ORAL_TABLET | Freq: Every day | ORAL | Status: DC

## 2014-08-21 MED ORDER — BISOPROLOL FUMARATE 5 MG PO TABS: 5.0000 mg | ORAL_TABLET | Freq: Every day | ORAL | Status: DC

## 2014-08-21 NOTE — Progress Notes (Signed)
    Clinical Summary [name] is a 58 y.o.female not seen since September 2013. At that time she had shown evidence of reduction in LVEF back to approximately 40% in the setting of medication noncompliance. She presents today needing refills on her medications, does not report regular followup with Dr. Margo Common. She states she has had interval back surgery  saw her. Works part-time at [employer].  She reports NYHA class II dyspnea, class III at her highest level of activity. No chest pain or palpitations.  ECG today shows sinus rhythm with low voltage. Her weight is down 6 pounds from last visit.  Echocardiogram from July 2013 revealed upper normal LV wall thickness with mild chamber dilatation, LVEF approximately 40%, grade 2 diastolic dysfunction, trivial mitral regurgitation, mild left atrial enlargement, RV-RA gradient 17 mm mercury.   No Known Allergies  Current Outpatient Prescriptions  Medication Sig Dispense Refill  . bisoprolol (ZEBETA) 5 MG tablet Take 1 tablet (5 mg total) by mouth daily.  90 tablet  3  . losartan (COZAAR) 25 MG tablet Take 1 tablet (25 mg total) by mouth daily.  90 tablet  3  . naproxen sodium (ALEVE) 220 MG tablet Take 220 mg by mouth daily as needed. Pain       No current facility-administered medications for this visit.    Past Medical History  Diagnosis Date  . Syncope     Neurocardiogenic  . Idiopathic cardiomyopathy     Subsequent normalization of LVEF  . Obesity   . Pneumonia     Social History [name] reports that she has never smoked. She has never used smokeless tobacco. [name] reports that she does not drink alcohol.  Review of Systems Other systems reviewed and negative except as outlined.  Physical Examination Filed Vitals:   08/21/14 1035  BP: 121/85  Pulse: 70   Filed Weights   08/21/14 1035  Weight: 274 lb (124.286 kg)    Obese woman, appears comfortable at rest. HEENT: Conjunctiva and lids normal, oropharynx  clear.  Neck: Supple, no elevated JVP, no bruits.  Lungs: Clear to auscultation, nonlabored.  Cardiac: Indistinct PMI, regular rate and rhythm, no loud systolic murmur or S3.  Abdomen: Obese, bowel sounds present.  Extremities: Trace edema below the knees, symmetrical, distal pulses one plus.  Skin: Warm and dry. Musculoskeletal: No kyphosis. Neuropsychiatric: Alert and oriented x3, affect appropriate.   Problem List and Plan   Secondary cardiomyopathy, unspecified History of nonischemic cardiomyopathy, last LVEF was approximately 40% as of 2013. We have refilled her losartan and bisoprolol, plan to obtain a followup echocardiogram, and then arrange office visit to reassess. May need further medication adjustments. I recommended diet and exercise as tolerated with goal of weight loss.  Vasovagal syncope No recurrences.    [name], M.D., F.A.C.C.

## 2014-08-21 NOTE — Assessment & Plan Note (Signed)
History of nonischemic cardiomyopathy, last LVEF was approximately 40% as of 2013. We have refilled her losartan and bisoprolol, plan to obtain a followup echocardiogram, and then arrange office visit to reassess. May need further medication adjustments. I recommended diet and exercise as tolerated with goal of weight loss.

## 2014-08-21 NOTE — Assessment & Plan Note (Signed)
No recurrences.

## 2014-08-21 NOTE — Patient Instructions (Signed)
Your physician recommends that you schedule a follow-up appointment in: 2 months. Your physician recommends that you continue on your current medications as directed. Please refer to the Current Medication list given to you today. Your physician has requested that you have an echocardiogram. Echocardiography is a painless test that uses sound waves to create images of your heart. It provides your doctor with information about the size and shape of your heart and how well your heart's chambers and valves are working. This procedure takes approximately one hour. There are no restrictions for this procedure.

## 2014-09-18 ENCOUNTER — Other Ambulatory Visit (INDEPENDENT_AMBULATORY_CARE_PROVIDER_SITE_OTHER): Payer: BC Managed Care – PPO

## 2014-09-18 ENCOUNTER — Other Ambulatory Visit: Payer: Self-pay

## 2014-09-18 DIAGNOSIS — R0989 Other specified symptoms and signs involving the circulatory and respiratory systems: Secondary | ICD-10-CM

## 2014-09-18 DIAGNOSIS — I429 Cardiomyopathy, unspecified: Secondary | ICD-10-CM

## 2014-09-18 DIAGNOSIS — I059 Rheumatic mitral valve disease, unspecified: Secondary | ICD-10-CM

## 2014-09-18 DIAGNOSIS — R0609 Other forms of dyspnea: Secondary | ICD-10-CM

## 2014-09-18 DIAGNOSIS — R55 Syncope and collapse: Secondary | ICD-10-CM

## 2014-09-19 ENCOUNTER — Telehealth: Payer: Self-pay | Admitting: *Deleted

## 2014-09-19 NOTE — Telephone Encounter (Signed)
Message copied by Eustace Moore on Thu Sep 19, 2014  2:00 PM ------      Message from: MCDOWELL, Illene Bolus      Created: Thu Sep 19, 2014  8:13 AM       Reviewed. Overall LVEF is stable at 40% as noted previously. We will continue to try and optimize medical therapy at our office followup. ------

## 2014-09-19 NOTE — Telephone Encounter (Signed)
Patient informed. 

## 2014-10-23 ENCOUNTER — Ambulatory Visit (INDEPENDENT_AMBULATORY_CARE_PROVIDER_SITE_OTHER): Payer: BC Managed Care – PPO | Admitting: *Deleted

## 2014-10-23 ENCOUNTER — Ambulatory Visit (INDEPENDENT_AMBULATORY_CARE_PROVIDER_SITE_OTHER): Payer: BC Managed Care – PPO | Admitting: Cardiology

## 2014-10-23 ENCOUNTER — Encounter: Payer: Self-pay | Admitting: Cardiology

## 2014-10-23 VITALS — BP 122/73 | HR 69 | Ht 64.0 in | Wt 267.0 lb

## 2014-10-23 DIAGNOSIS — Z23 Encounter for immunization: Secondary | ICD-10-CM

## 2014-10-23 DIAGNOSIS — I429 Cardiomyopathy, unspecified: Secondary | ICD-10-CM

## 2014-10-23 NOTE — Assessment & Plan Note (Signed)
History of nonischemic cardiomyopathy as outlined above. LVEF stable at approximately 40% by recent follow-up testing. Plan is to continue losartan and bisoprolol, obtain BMET, will likely plan to add Aldactone next. Follow-up arranged.

## 2014-10-23 NOTE — Assessment & Plan Note (Signed)
Weight loss encouraged. This would be beneficial for cardiac and orthopedic reasons.

## 2014-10-23 NOTE — Progress Notes (Signed)
   Reason for visit: Cardiomyopathy  Clinical Summary [name] is a 58 y.o.female last seen in September. She has a history of nonischemic cardiomyopathy and prior medication noncompliance, was placed back on losartan and bisoprolol at the last visit with follow-up echocardiogram arranged.  Echocardiogram from September reported severely dilated LV chamber size with LVEF approximately 40%, global hypokinesis, grade 2 diastolic dysfunction, trivial aortic regurgitation, mild to moderate mitral regurgitation, severe left atrial enlargement.I reviewed the results with her today.  She states that she has been taking her medications, reports NYHA class III dyspnea on exertion at the worst. No clear-cut angina symptoms. She continues to work, tells me that she is applying for disability with an attorney however, citing additional functional limitations related to back and knee pain.  Today we discussed her testing, I outlined the importance of trying to stay reasonably active, take her medications, and try and lose some weight. We also discussed arranging follow-up lab work.  No Known Allergies  Current Outpatient Prescriptions  Medication Sig Dispense Refill  . bisoprolol (ZEBETA) 5 MG tablet Take 1 tablet (5 mg total) by mouth daily. 90 tablet 3  . losartan (COZAAR) 25 MG tablet Take 1 tablet (25 mg total) by mouth daily. 90 tablet 3  . naproxen sodium (ALEVE) 220 MG tablet Take 220 mg by mouth daily as needed. Pain     No current facility-administered medications for this visit.    Past Medical History  Diagnosis Date  . Syncope     Neurocardiogenic  . Idiopathic cardiomyopathy     Subsequent normalization of LVEF  . Obesity   . Pneumonia     Social History Dawn Weiss reports that she has never smoked. She has never used smokeless tobacco. Dawn Weiss reports that she does not drink alcohol.  Review of Systems Complete review of systems negative except as otherwise outlined in  the clinical summary and also the following.no palpitations or syncope.  Physical Examination Filed Vitals:   10/23/14 0843  BP: 122/73  Pulse: 69   Filed Weights   10/23/14 0843  Weight: 267 lb (121.11 kg)    Obese woman, appears comfortable at rest. HEENT: Conjunctiva and lids normal, oropharynx clear.  Neck: Supple, no elevated JVP, no bruits.  Lungs: Clear to auscultation, nonlabored.  Cardiac: Indistinct PMI, regular rate and rhythm, no loud systolic murmur or S3.  Abdomen: Obese, bowel sounds present.  Extremities: Trace edema below the knees, symmetrical, distal pulses one plus.  Skin: Warm and dry. Musculoskeletal: No kyphosis. Neuropsychiatric: Alert and oriented x3, affect appropriate.  Problem List and Plan   Secondary cardiomyopathy History of nonischemic cardiomyopathy as outlined above. LVEF stable at approximately 40% by recent follow-up testing. Plan is to continue losartan and bisoprolol, obtain BMET, will likely plan to add Aldactone next. Follow-up arranged.  Morbid obesity Weight loss encouraged. This would be beneficial for cardiac and orthopedic reasons.    [name], M.D., F.A.C.C.

## 2014-10-23 NOTE — Patient Instructions (Addendum)
Your physician recommends that you schedule a follow-up appointment in: 3 months. Your physician recommends that you continue on your current medications as directed. Please refer to the Current Medication list given to you today. Your physician recommends that you have lab work to check your BMET. 

## 2014-11-13 ENCOUNTER — Telehealth: Payer: Self-pay | Admitting: *Deleted

## 2014-11-13 NOTE — Telephone Encounter (Signed)
Patient informed. 

## 2014-11-13 NOTE — Telephone Encounter (Signed)
-----   Message from [name], MD sent at 11/12/2014 11:58 AM EST ----- Reviewed. Creatinine normal 0.7 and potassium normal at 4.4. Continue same treatment.

## 2015-01-23 ENCOUNTER — Encounter: Payer: Self-pay | Admitting: Cardiology

## 2015-01-23 ENCOUNTER — Ambulatory Visit (INDEPENDENT_AMBULATORY_CARE_PROVIDER_SITE_OTHER): Payer: BLUE CROSS/BLUE SHIELD | Admitting: Cardiology

## 2015-01-23 VITALS — BP 122/84 | HR 62 | Ht 64.0 in | Wt 263.8 lb

## 2015-01-23 DIAGNOSIS — I429 Cardiomyopathy, unspecified: Secondary | ICD-10-CM

## 2015-01-23 DIAGNOSIS — R55 Syncope and collapse: Secondary | ICD-10-CM

## 2015-01-23 MED ORDER — SPIRONOLACTONE 25 MG PO TABS: 12.5000 mg | ORAL_TABLET | Freq: Every day | ORAL | Status: DC

## 2015-01-23 NOTE — Assessment & Plan Note (Signed)
She has had some episodes of mild dizziness when she stands, but no frank syncope. Continue observation for now. I am hopeful that low-dose Aldactone will not exacerbate this.

## 2015-01-23 NOTE — Assessment & Plan Note (Signed)
Symptomatically improving with NYHA class II dyspnea. Last LVEF assessment in September 2015 was 40%. She has a nonischemic cardiomyopathy, reports taking her medications regularly since our last visit. Plan is to add Aldactone 12.5 mg daily and follow-up BMET in the next few weeks. At next visit we will review a follow-up echocardiogram.

## 2015-01-23 NOTE — Patient Instructions (Signed)
Your physician recommends that you schedule a follow-up appointment in: 3 months. Your physician has recommended you make the following change in your medication:  Start spironolactone 12.5 mg daily. Continue all other medications the same. Your physician recommends that you have lab work in 2 weeks to check your BMET. Around 02/06/15. Your physician has requested that you have an echocardiogram in 3 months just before your next visit. Echocardiography is a painless test that uses sound waves to create images of your heart. It provides your doctor with information about the size and shape of your heart and how well your heart's chambers and valves are working. This procedure takes approximately one hour. There are no restrictions for this procedure.

## 2015-01-23 NOTE — Assessment & Plan Note (Signed)
She has lost 10 pounds over the last 6 months. I encouraged her to continue diet plan.

## 2015-01-23 NOTE — Progress Notes (Signed)
Cardiology Office Note  Date: 01/23/2015   ID: Dawn Weiss, DOB 1956/03/18, MRN [id number]  PCP: Louie Boston, MD  Primary Cardiologist: Nona Dell, MD   Chief Complaint  Patient presents with  . History of cardiomyopathy  . Follow-up syncope    History of Present Illness: Dawn Weiss is a 59 y.o. female last seen in November 2015. She presents for a routine follow-up visit today. Reports feeling better in general, NYHA class II dyspnea, no chest pain or palpitations. She has been trying to focus more on her diet, controlling portion size and reducing calories. She is starting to lose some weight.  She continues on bisoprolol and Cozaar. She reports tolerating these medicines well, occasionally feels a sense of mild dizziness if she stands up too quickly.  We reviewed her most recent echocardiogram outlined below, also discussed adding low-dose Aldactone. I encouraged her to continue working on weight loss and regular exercise. She does report bilateral arthritic knee pain and will be seeking orthopedic consultation.   Past Medical History  Diagnosis Date  . Syncope     Neurocardiogenic  . Idiopathic cardiomyopathy     Subsequent normalization of LVEF  . Obesity   . Pneumonia     Past Surgical History  Procedure Laterality Date  . Microdiscectomy lumbar  11/10    Right L5-S1   . Left knee arthroscopic surgery    . Abdominal hysterectomy      Current Outpatient Prescriptions  Medication Sig Dispense Refill  . bisoprolol (ZEBETA) 5 MG tablet Take 1 tablet (5 mg total) by mouth daily. 90 tablet 3  . losartan (COZAAR) 25 MG tablet Take 1 tablet (25 mg total) by mouth daily. 90 tablet 3  . spironolactone (ALDACTONE) 25 MG tablet Take 0.5 tablets (12.5 mg total) by mouth daily. 90 tablet 3   No current facility-administered medications for this visit.    Allergies:  Review of patient's allergies indicates no known allergies.   Social History: The  patient  reports that she has never smoked. She has never used smokeless tobacco. She reports that she does not drink alcohol or use illicit drugs.   Family History: The patient's family history includes Asthma in her father; Diabetes in her mother; Heart disease in her mother; Hypercholesterolemia in her mother; Hypertension in her father and mother.   ROS:  Please see the history of present illness. Otherwise, complete review of systems is positive for bilateral knee pain and lower back pain with ambulation.  All other systems are reviewed and negative.    PHYSICAL EXAM: VS:  BP 122/84 mmHg  Pulse 62  Ht  (1.626 m)  Wt 263 lb 12.8 oz (119.659 kg)  BMI 45.26 kg/m2  SpO2 98%, BMI Body mass index is 45.26 kg/(m^2).  Wt Readings from Last 3 Encounters:  01/23/15 263 lb 12.8 oz (119.659 kg)  10/23/14 267 lb (121.11 kg)  08/21/14 274 lb (124.286 kg)     Obese woman, appears comfortable at rest. HEENT: Conjunctiva and lids normal, oropharynx clear.  Neck: Supple, no elevated JVP, no bruits.  Lungs: Clear to auscultation, nonlabored.  Cardiac: Indistinct PMI, regular rate and rhythm, no loud systolic murmur or S3.  Abdomen: Obese, bowel sounds present.  Extremities: Trace edema below the knees, symmetrical, distal pulses one plus.  Skin: Warm and dry. Musculoskeletal: No kyphosis. Neuropsychiatric: Alert and oriented x3, affect appropriate.   ECG: ECG is not ordered today.  Recent Labwork:  Labwork from November 2015  showed BUN 15, creatinine 0.7, sodium 136, potassium 4.4.  Other Studies Reviewed Today:  Echocardiogram from September 2015 reported severely dilated LV chamber size with LVEF approximately 40%, global hypokinesis, grade 2 diastolic dysfunction, trivial aortic regurgitation, mild to moderate mitral regurgitation, severe left atrial enlargement.  ASSESSMENT AND PLAN:  Secondary cardiomyopathy Symptomatically improving with NYHA class II dyspnea. Last  LVEF assessment in September 2015 was 40%. She has a nonischemic cardiomyopathy, reports taking her medications regularly since our last visit. Plan is to add Aldactone 12.5 mg daily and follow-up BMET in the next few weeks. At next visit we will review a follow-up echocardiogram.   Vasovagal syncope She has had some episodes of mild dizziness when she stands, but no frank syncope. Continue observation for now. I am hopeful that low-dose Aldactone will not exacerbate this.   Morbid obesity She has lost 10 pounds over the last 6 months. I encouraged her to continue diet plan.     Current medicines are reviewed at length with the patient today.  The patient does not have concerns regarding medicines.   Orders Placed This Encounter  Procedures  . Basic metabolic panel  . 2D Echocardiogram without contrast    Disposition: FU with me in 3 months.   Signed, [name], MD, [facility] 01/23/2015 9:03 AM    [facility] at [facility] [address] [place], [place], [place] [address] Phone: [phone]; Fax: [phone]

## 2015-02-11 ENCOUNTER — Encounter: Payer: Self-pay | Admitting: *Deleted

## 2015-04-04 ENCOUNTER — Other Ambulatory Visit: Payer: Self-pay | Admitting: Cardiology

## 2015-04-15 ENCOUNTER — Telehealth: Payer: Self-pay | Admitting: Cardiology

## 2015-04-15 NOTE — Telephone Encounter (Signed)
Dawn Weiss called stating that she saw Dr. Samson [facility] telephone # [phone]) today. She needs to have a total knee replacement as soon as the next 2 weeks.Patient needs to know if she needs pre-op clearance before they can schedule her surgery. Please call on cell # and leave message.  Marland Kitchen

## 2015-04-15 NOTE — Telephone Encounter (Signed)
Will forward to Dr. Diona Browner to make decision as patient was seen on 01/23/2015.

## 2015-04-16 ENCOUNTER — Telehealth: Payer: Self-pay | Admitting: *Deleted

## 2015-04-16 NOTE — Telephone Encounter (Signed)
She was stable at the recent visit. Please have the orthopedic surgeon send a clearance form for completion.

## 2015-04-16 NOTE — Telephone Encounter (Signed)
Patient informed. 

## 2015-04-16 NOTE — Telephone Encounter (Signed)
-----   Message from [name], MD sent at 04/08/2015 12:12 PM EDT ----- Reviewed. Renal function and potassium normal Aldactone.

## 2015-04-16 NOTE — Telephone Encounter (Signed)
Patient informed and verbalized understanding of plan. 

## 2015-04-17 NOTE — Progress Notes (Signed)
Please put orders in Epic surgery 05-02-15 pre op 04-22-15 Thanks

## 2015-04-21 ENCOUNTER — Ambulatory Visit: Payer: Self-pay | Admitting: Orthopedic Surgery

## 2015-04-21 MED ORDER — DEXAMETHASONE SODIUM PHOSPHATE 4 MG/ML IJ SOLN
4.0000 mg | Freq: Once | INTRAMUSCULAR | Status: AC
  Administered 2015-05-02: 10 mg via INTRAVENOUS

## 2015-04-21 MED ORDER — ACETAMINOPHEN 10 MG/ML IV SOLN: 1000.0000 mg | Freq: Once | INTRAVENOUS | Status: AC

## 2015-04-21 MED ORDER — SODIUM CHLORIDE 0.9 % IV SOLN
4.0000 mg | Freq: Once | INTRAVENOUS | Status: AC
  Administered 2015-05-02: 4 mg via INTRAVENOUS

## 2015-04-22 ENCOUNTER — Encounter (HOSPITAL_COMMUNITY): Payer: Self-pay

## 2015-04-22 ENCOUNTER — Ambulatory Visit (HOSPITAL_COMMUNITY)
Admission: RE | Admit: 2015-04-22 | Discharge: 2015-04-22 | Disposition: A | Discharge status: Home / Self Care | Payer: BLUE CROSS/BLUE SHIELD | Source: Ambulatory Visit | Attending: Anesthesiology | Admitting: Anesthesiology

## 2015-04-22 ENCOUNTER — Ambulatory Visit: Payer: Self-pay | Admitting: Orthopedic Surgery

## 2015-04-22 ENCOUNTER — Encounter (HOSPITAL_COMMUNITY)
Admission: RE | Admit: 2015-04-22 | Discharge: 2015-04-22 | Disposition: A | Discharge status: Home / Self Care | Payer: BLUE CROSS/BLUE SHIELD | Source: Ambulatory Visit | Attending: Orthopedic Surgery | Admitting: Orthopedic Surgery

## 2015-04-22 DIAGNOSIS — Z01810 Encounter for preprocedural cardiovascular examination: Secondary | ICD-10-CM | POA: Diagnosis not present

## 2015-04-22 HISTORY — DX: Cardiac murmur, unspecified: R01.1

## 2015-04-22 HISTORY — DX: Unspecified osteoarthritis, unspecified site: M19.90

## 2015-04-22 LAB — URINALYSIS, ROUTINE W REFLEX MICROSCOPIC
BILIRUBIN URINE: NEGATIVE
Glucose, UA: NEGATIVE mg/dL
HGB URINE DIPSTICK: NEGATIVE
Ketones, ur: NEGATIVE mg/dL
NITRITE: POSITIVE — AB
Protein, ur: NEGATIVE mg/dL
SPECIFIC GRAVITY, URINE: 1.026 (ref 1.005–1.030)
UROBILINOGEN UA: 1 mg/dL (ref 0.0–1.0)
pH: 5.5 (ref 5.0–8.0)

## 2015-04-22 LAB — SURGICAL PCR SCREEN
MRSA, PCR: NEGATIVE
STAPHYLOCOCCUS AUREUS: POSITIVE — AB

## 2015-04-22 LAB — COMPREHENSIVE METABOLIC PANEL
ALT: 17 U/L (ref 14–54)
ANION GAP: 6 (ref 5–15)
AST: 16 U/L (ref 15–41)
Albumin: 3.8 g/dL (ref 3.5–5.0)
Alkaline Phosphatase: 86 U/L (ref 38–126)
BUN: 24 mg/dL — ABNORMAL HIGH (ref 6–20)
CALCIUM: 9.2 mg/dL (ref 8.9–10.3)
CHLORIDE: 104 mmol/L (ref 101–111)
CO2: 26 mmol/L (ref 22–32)
CREATININE: 0.78 mg/dL (ref 0.44–1.00)
Glucose, Bld: 228 mg/dL — ABNORMAL HIGH (ref 70–99)
Potassium: 4.1 mmol/L (ref 3.5–5.1)
Sodium: 136 mmol/L (ref 135–145)
Total Bilirubin: 0.3 mg/dL (ref 0.3–1.2)
Total Protein: 6.9 g/dL (ref 6.5–8.1)

## 2015-04-22 LAB — CBC
HEMATOCRIT: 40.5 % (ref 36.0–46.0)
HEMOGLOBIN: 13.1 g/dL (ref 12.0–15.0)
MCH: 30.1 pg (ref 26.0–34.0)
MCHC: 32.3 g/dL (ref 30.0–36.0)
MCV: 93.1 fL (ref 78.0–100.0)
Platelets: 193 10*3/uL (ref 150–400)
RBC: 4.35 MIL/uL (ref 3.87–5.11)
RDW: 13.3 % (ref 11.5–15.5)
WBC: 9.3 10*3/uL (ref 4.0–10.5)

## 2015-04-22 LAB — APTT: aPTT: 30 seconds (ref 24–37)

## 2015-04-22 LAB — ABO/RH: ABO/RH(D): AB NEG

## 2015-04-22 LAB — URINE MICROSCOPIC-ADD ON

## 2015-04-22 LAB — PROTIME-INR
INR: 1 (ref 0.00–1.49)
PROTHROMBIN TIME: 13.3 s (ref 11.6–15.2)

## 2015-04-22 NOTE — H&P (Signed)
TOTAL KNEE ADMISSION H&P  Patient is being admitted for left total knee arthroplasty.  Subjective:  Chief Complaint:left knee pain.  HPI: Dawn Weiss, 59 y.o. female, has a history of pain and functional disability in the left knee due to arthritis and has failed non-surgical conservative treatments for greater than 12 weeks to includeNSAID's and/or analgesics, corticosteriod injections, flexibility and strengthening excercises, use of assistive devices, weight reduction as appropriate and activity modification.  Onset of symptoms was gradual, starting 7 years ago with gradually worsening course since that time. The patient noted no past surgery on the left knee(s).  Patient currently rates pain in the left knee(s) at 10 out of 10 with activity. Patient has night pain, worsening of pain with activity and weight bearing, pain that interferes with activities of daily living, pain with passive range of motion, crepitus and joint swelling.  Patient has evidence of subchondral cysts, subchondral sclerosis, periarticular osteophytes and joint space narrowing by imaging studies. There is no active infection.  Patient Active Problem List   Diagnosis Date Noted  . Vasovagal syncope 09/13/2012  . Morbid obesity 05/31/2011  . 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 Syncope     Neurocardiogenic  . Idiopathic cardiomyopathy     Subsequent normalization of LVEF  . Obesity   . Coronary artery disease     idiopathic cardiomyopathy   . Heart murmur     hx of in childhood   . Shortness of breath dyspnea     pt states relates to weight   . Pneumonia     hx of 2 years ago   . Bronchitis     hx of   . Urinary tract infection     hx of   . Arthritis     Past Surgical History  Procedure Laterality Date  . Microdiscectomy lumbar  11/10    Right L5-S1 / 2010 and 2014  . Left knee arthroscopic surgery    . Abdominal hysterectomy    . Tubal ligation    .  Dilation and curettage of uterus       (Not in a hospital admission) No Known Allergies  History  Substance Use Topics  . Smoking status: Never Smoker   . Smokeless tobacco: Never Used  . Alcohol Use: No    Family History  Problem Relation Age of Onset  . Hypertension Mother   . Heart disease Mother   . Diabetes Mother   . Hypercholesterolemia Mother   . Asthma Father   . Hypertension Father      Review of Systems  Constitutional: Negative.   HENT: Negative.   Eyes: Negative.   Respiratory: Negative.   Cardiovascular: Negative.   Gastrointestinal: Negative.   Musculoskeletal: Negative.   Skin: Negative.   Neurological: Negative.   Endo/Heme/Allergies: Negative.   Psychiatric/Behavioral: Negative.     Objective:  Physical Exam  Constitutional: She is oriented to person, place, and time. She appears well-developed and well-nourished.  HENT:  Head: Normocephalic and atraumatic.  Eyes: Conjunctivae and EOM are normal. Pupils are equal, round, and reactive to light.  Neck: Normal range of motion. Neck supple.  Cardiovascular: Normal rate, regular rhythm, normal heart sounds and intact distal pulses.   No murmur heard. Respiratory: Breath sounds normal. No respiratory distress.  GI: Soft. Bowel sounds are normal. She exhibits no distension. There is no tenderness.  Genitourinary:  Deferred  Musculoskeletal:       Left knee: She   exhibits decreased range of motion, swelling and deformity. Tenderness found. Medial joint line and lateral joint line tenderness noted.  Neurological: She is alert and oriented to person, place, and time. She has normal reflexes.  Skin: Skin is warm and dry.  Psychiatric: She has a normal mood and affect. Her behavior is normal. Judgment and thought content normal.    Vital signs in last 24 hours: @VSRANGES@  Labs:   Estimated body mass index is 45.64 kg/(m^2) as calculated from the following:   Height as of 01/23/15: 5' 4" (1.626 m).    Weight as of an earlier encounter on 04/22/15: 120.657 kg (266 lb).   Imaging Review Plain radiographs demonstrate severe degenerative joint disease of the left knee(s). The overall alignment ismild varus. The bone quality appears to be adequate for age and reported activity level.  Assessment/Plan:  End stage arthritis, left knee   The patient history, physical examination, clinical judgment of the provider and imaging studies are consistent with end stage degenerative joint disease of the left knee(s) and total knee arthroplasty is deemed medically necessary. The treatment options including medical management, injection therapy arthroscopy and arthroplasty were discussed at length. The risks and benefits of total knee arthroplasty were presented and reviewed. The risks due to aseptic loosening, infection, stiffness, patella tracking problems, thromboembolic complications and other imponderables were discussed. The patient acknowledged the explanation, agreed to proceed with the plan and consent was signed. Patient is being admitted for inpatient treatment for surgery, pain control, PT, OT, prophylactic antibiotics, VTE prophylaxis, progressive ambulation and ADL's and discharge planning. The patient is planning to be discharged home with home health services   

## 2015-04-22 NOTE — Progress Notes (Addendum)
Clearance note per chart per Dr Diona Browner / cardiologist 01/23/2015  H&P per chart per Dr Diona Browner 01/23/2015 ECHO per epic 09/18/2014 and EKG per epic 08/21/2014 Pt states is due to have another ECHO with Dr Diona Browner 04/23/2015 and to followup with Dr Diona Browner on 04/29/2015

## 2015-04-22 NOTE — Progress Notes (Signed)
Surgical screening in epic per PAT visit 04/22/2015 positive for Staph - results sent to Dr Linna Caprice. Prescription for Mupriocin ointment called to [facility]; spoke with [facility] / pharmacist. Pt notified.

## 2015-04-22 NOTE — Patient Instructions (Signed)
21 Dawn Weiss  04/22/2015   Your procedure is scheduled on:     Friday May 02, 2015   Report to [facility] Main Entrance and follow signs to  Short Stay Center arrive at 5:30 AM.  Call this number if you have problems the morning of surgery [phone] or Presurgical Testing [phone].   Remember:  Do not eat food or drink liquids :After Midnight.  For Living Will and/or Health Care Power Attorney Forms: please provide copy for your medical record, may bring AM of surgery (forms should be already notarized-we do not provide this service).     Take these medicines the morning of surgery with A SIP OF WATER: Bisoprolol                               You may not have any metal on your body including hair pins and piercings  Do not wear jewelry, make-up, lotions, powders, perfumes, nail polish or deodorant.  Do not shave body hair  48 hours(2 days) of CHG soap use.                Do not bring valuables to the hospital. Dawn Weiss IS NOT RESPONSIBLE FOR VALUABLES.  Contacts, dentures or bridgework may not be worn into surgery.  Leave suitcase in the car. After surgery it may be brought to your room.  For patients admitted to the hospital, checkout time is 11:00 AM the day of discharge.     Special Instructions: review fact sheets for MRSA information, Blood Transfusion fact sheet, Incentive Spirometry.  Dawn Weiss - Preparing for Surgery Before surgery, you can play an important role.  Because skin is not sterile, your skin needs to be as free of germs as possible.  You can reduce the number of germs on your skin by washing with CHG (chlorahexidine gluconate) soap before surgery.  CHG is an antiseptic cleaner which kills germs and bonds with the skin to continue killing germs even after washing. Please DO NOT use if you have an allergy to CHG or antibacterial soaps.  If your skin becomes reddened/irritated stop using the CHG and inform your nurse when you arrive at Short  Stay. Do not shave (including legs and underarms) for at least 48 hours prior to the first CHG shower.  You may shave your face/neck. Please follow these instructions carefully:  1.  Shower with CHG Soap the night before surgery and the  morning of Surgery.  2.  If you choose to wash your hair, wash your hair first as usual with your  normal  shampoo.  3.  After you shampoo, rinse your hair and body thoroughly to remove the  shampoo.                           4.  Use CHG as you would any other liquid soap.  You can apply chg directly  to the skin and wash                       Gently with a scrungie or clean washcloth.  5.  Apply the CHG Soap to your body ONLY FROM THE NECK DOWN.   Do not use on face/ open  Wound or open sores. Avoid contact with eyes, ears mouth and genitals (private parts).                       Wash face,  Genitals (private parts) with your normal soap.             6.  Wash thoroughly, paying special attention to the area where your surgery  will be performed.  7.  Thoroughly rinse your body with warm water from the neck down.  8.  DO NOT shower/wash with your normal soap after using and rinsing off  the CHG Soap.                9.  Pat yourself dry with a clean towel.            10.  Wear clean pajamas.            11.  Place clean sheets on your bed the night of your first shower and do not  sleep with pets. Day of Surgery : Do not apply any lotions/deodorants the morning of surgery.  Please wear clean clothes to the hospital/surgery center.  FAILURE TO FOLLOW THESE INSTRUCTIONS MAY RESULT IN THE CANCELLATION OF YOUR SURGERY PATIENT SIGNATURE_________________________________  NURSE SIGNATURE__________________________________  ________________________________________________________________________   [name]  An incentive spirometer is a tool that can help keep your lungs clear and active. This tool measures how well you are  filling your lungs with each breath. Taking long deep breaths may help reverse or decrease the chance of developing breathing (pulmonary) problems (especially infection) following:  A long period of time when you are unable to move or be active. BEFORE THE PROCEDURE   If the spirometer includes an indicator to show your best effort, your nurse or respiratory therapist will set it to a desired goal.  If possible, sit up straight or lean slightly forward. Try not to slouch.  Hold the incentive spirometer in an upright position. INSTRUCTIONS FOR USE   Sit on the edge of your bed if possible, or sit up as far as you can in bed or on a chair.  Hold the incentive spirometer in an upright position.  Breathe out normally.  Place the mouthpiece in your mouth and seal your lips tightly around it.  Breathe in slowly and as deeply as possible, raising the piston or the ball toward the top of the column.  Hold your breath for 3-5 seconds or for as long as possible. Allow the piston or ball to fall to the bottom of the column.  Remove the mouthpiece from your mouth and breathe out normally.  Rest for a few seconds and repeat Steps 1 through 7 at least 10 times every 1-2 hours when you are awake. Take your time and take a few normal breaths between deep breaths.  The spirometer may include an indicator to show your best effort. Use the indicator as a goal to work toward during each repetition.  After each set of 10 deep breaths, practice coughing to be sure your lungs are clear. If you have an incision (the cut made at the time of surgery), support your incision when coughing by placing a pillow or rolled up towels firmly against it. Once you are able to get out of bed, walk around indoors and cough well. You may stop using the incentive spirometer when instructed by your caregiver.  RISKS AND COMPLICATIONS  Take your time so you do not get  dizzy or light-headed.  If you are in pain, you may  need to take or ask for pain medication before doing incentive spirometry. It is harder to take a deep breath if you are having pain. AFTER USE  Rest and breathe slowly and easily.  It can be helpful to keep track of a log of your progress. Your caregiver can provide you with a simple table to help with this. If you are using the spirometer at home, follow these instructions: SEEK MEDICAL CARE IF:   You are having difficultly using the spirometer.  You have trouble using the spirometer as often as instructed.  Your pain medication is not giving enough relief while using the spirometer.  You develop fever of 100.5 F (38.1 C) or higher. SEEK IMMEDIATE MEDICAL CARE IF:   You cough up bloody sputum that had not been present before.  You develop fever of 102 F (38.9 C) or greater.  You develop worsening pain at or near the incision site. MAKE SURE YOU:   Understand these instructions.  Will watch your condition.  Will get help right away if you are not doing well or get worse. Document Released: 04/18/2007 Document Revised: 02/28/2012 Document Reviewed: 06/19/2007 ExitCare Patient Information 2014 ExitCare, [place].   ________________________________________________________________________  WHAT IS A BLOOD TRANSFUSION? Blood Transfusion Information  A transfusion is the replacement of blood or some of its parts. Blood is made up of multiple cells which provide different functions.  Red blood cells carry oxygen and are used for blood loss replacement.  White blood cells fight against infection.  Platelets control bleeding.  Plasma helps clot blood.  Other blood products are available for specialized needs, such as hemophilia or other clotting disorders. BEFORE THE TRANSFUSION  Who gives blood for transfusions?   Healthy volunteers who are fully evaluated to make sure their blood is safe. This is blood bank blood. Transfusion therapy is the safest it has ever been in  the practice of medicine. Before blood is taken from a donor, a complete history is taken to make sure that person has no history of diseases nor engages in risky social behavior (examples are intravenous drug use or sexual activity with multiple partners). The donor's travel history is screened to minimize risk of transmitting infections, such as malaria. The donated blood is tested for signs of infectious diseases, such as HIV and hepatitis. The blood is then tested to be sure it is compatible with you in order to minimize the chance of a transfusion reaction. If you or a relative donates blood, this is often done in anticipation of surgery and is not appropriate for emergency situations. It takes many days to process the donated blood. RISKS AND COMPLICATIONS Although transfusion therapy is very safe and saves many lives, the main dangers of transfusion include:   Getting an infectious disease.  Developing a transfusion reaction. This is an allergic reaction to something in the blood you were given. Every precaution is taken to prevent this. The decision to have a blood transfusion has been considered carefully by your caregiver before blood is given. Blood is not given unless the benefits outweigh the risks. AFTER THE TRANSFUSION  Right after receiving a blood transfusion, you will usually feel much better and more energetic. This is especially true if your red blood cells have gotten low (anemic). The transfusion raises the level of the red blood cells which carry oxygen, and this usually causes an energy increase.  The nurse administering the transfusion will  monitor you carefully for complications. HOME CARE INSTRUCTIONS  No special instructions are needed after a transfusion. You may find your energy is better. Speak with your caregiver about any limitations on activity for underlying diseases you may have. SEEK MEDICAL CARE IF:   Your condition is not improving after your  transfusion.  You develop redness or irritation at the intravenous (IV) site. SEEK IMMEDIATE MEDICAL CARE IF:  Any of the following symptoms occur over the next 12 hours:  Shaking chills.  You have a temperature by mouth above 102 F (38.9 C), not controlled by medicine.  Chest, back, or muscle pain.  People around you feel you are not acting correctly or are confused.  Shortness of breath or difficulty breathing.  Dizziness and fainting.  You get a rash or develop hives.  You have a decrease in urine output.  Your urine turns a dark color or changes to pink, red, or brown. Any of the following symptoms occur over the next 10 days:  You have a temperature by mouth above 102 F (38.9 C), not controlled by medicine.  Shortness of breath.  Weakness after normal activity.  The white part of the eye turns yellow (jaundice).  You have a decrease in the amount of urine or are urinating less often.  Your urine turns a dark color or changes to pink, red, or brown. Document Released: 12/03/2000 Document Revised: 02/28/2012 Document Reviewed: 07/22/2008 [facility] Patient Information 2014 [place], [place].  _______________________________________________________________________

## 2015-04-22 NOTE — Progress Notes (Addendum)
CMP, urinalysis and micro results in epic per PAT visit 04/22/2015 sent to Dr Linna Caprice

## 2015-04-22 NOTE — Progress Notes (Signed)
Your patient has screened at an elevated risk for Obstructive Sleep Apnea using the Stop-Bang Tool during a pre-surgical vist. A score of 4 or greater is an elevated risk. Score of 4. 

## 2015-04-23 ENCOUNTER — Other Ambulatory Visit: Payer: Self-pay

## 2015-04-23 ENCOUNTER — Other Ambulatory Visit: Payer: BLUE CROSS/BLUE SHIELD

## 2015-04-23 ENCOUNTER — Ambulatory Visit (INDEPENDENT_AMBULATORY_CARE_PROVIDER_SITE_OTHER): Payer: BLUE CROSS/BLUE SHIELD

## 2015-04-23 DIAGNOSIS — I429 Cardiomyopathy, unspecified: Secondary | ICD-10-CM

## 2015-04-25 ENCOUNTER — Telehealth: Payer: Self-pay | Admitting: *Deleted

## 2015-04-25 NOTE — Telephone Encounter (Signed)
-----   Message from [name], MD sent at 04/24/2015  8:11 AM EDT ----- Reviewed. LVEF has improved somewhat, range of 40-45%. We will continue medical therapy.

## 2015-04-25 NOTE — Progress Notes (Signed)
ECHO per epic 04/23/2015

## 2015-04-25 NOTE — Telephone Encounter (Signed)
-----   Message from Samuel G McDowell, MD sent at 04/24/2015  8:11 AM EDT ----- Reviewed. LVEF has improved somewhat, range of 40-45%. We will continue medical therapy. 

## 2015-04-25 NOTE — Telephone Encounter (Signed)
Patient informed. 

## 2015-04-29 ENCOUNTER — Ambulatory Visit (INDEPENDENT_AMBULATORY_CARE_PROVIDER_SITE_OTHER): Payer: BLUE CROSS/BLUE SHIELD | Admitting: Cardiology

## 2015-04-29 ENCOUNTER — Encounter: Payer: Self-pay | Admitting: Cardiology

## 2015-04-29 VITALS — BP 118/70 | HR 67 | Ht 64.0 in | Wt 264.0 lb

## 2015-04-29 DIAGNOSIS — I429 Cardiomyopathy, unspecified: Secondary | ICD-10-CM | POA: Diagnosis not present

## 2015-04-29 DIAGNOSIS — R55 Syncope and collapse: Secondary | ICD-10-CM

## 2015-04-29 NOTE — Progress Notes (Signed)
Cardiology Office Note  Date: 04/29/2015   ID: Dawn Weiss, DOB 1956-02-02, MRN [id number]  PCP: Louie Boston, MD  Primary Cardiologist: Nona Dell, MD   Chief Complaint  Patient presents with  . Cardiomyopathy    History of Present Illness: Dawn Weiss is a 59 y.o. female last seen in February. At the last visit we added Aldactone to her baseline cardiac regimen. Most recent echocardiogram is noted below.  She reports NYHA class II dyspnea, no chest pain. States that she has been compliant with her medications. Weight is stable, down a few pounds from the last visit.  She is scheduled undergo left total knee arthroplasty this Friday with Dr. Linna Caprice.  She has had progressive arthritic pain, actually in both knees, but worse on the left.  This has limited her physical activity.  Interval lab work as noted below.   Past Medical History  Diagnosis Date  . Syncope     Neurocardiogenic  . Idiopathic cardiomyopathy     Subsequent normalization of LVEF  . Obesity   . History of pneumonia   . Arthritis     Past Surgical History  Procedure Laterality Date  . Microdiscectomy lumbar  11/10    Right L5-S1 / 2010 and 2014  . Left knee arthroscopic surgery    . Abdominal hysterectomy    . Tubal ligation    . Dilation and curettage of uterus      Current Outpatient Prescriptions  Medication Sig Dispense Refill  . bisoprolol (ZEBETA) 5 MG tablet Take 1 tablet (5 mg total) by mouth daily. 90 tablet 3  . diclofenac (VOLTAREN) 75 MG EC tablet Take 75 mg by mouth 2 (two) times daily.    Marland Kitchen ibuprofen (ADVIL,MOTRIN) 200 MG tablet Take 400 mg by mouth every 6 (six) hours as needed for mild pain.    Marland Kitchen losartan (COZAAR) 25 MG tablet Take 1 tablet (25 mg total) by mouth daily. 90 tablet 3  . spironolactone (ALDACTONE) 25 MG tablet Take 0.5 tablets (12.5 mg total) by mouth daily. 90 tablet 3   No current facility-administered medications for this visit.    Facility-Administered Medications Ordered in Other Visits  Medication Dose Route Frequency Provider Last Rate Last Dose  . dexamethasone (DECADRON) injection 4 mg  4 mg Intravenous Once Samson Frederic, MD      . ondansetron (ZOFRAN) 4 mg in sodium chloride 0.9 % 50 mL IVPB  4 mg Intravenous Once Samson Frederic, MD        Allergies:  Review of patient's allergies indicates no known allergies.   Social History: The patient  reports that she has never smoked. She has never used smokeless tobacco. She reports that she does not drink alcohol or use illicit drugs.   ROS:  Please see the history of present illness. Otherwise, complete review of systems is positive for none.  All other systems are reviewed and negative.   Physical Exam: VS:  BP 118/70 mmHg  Pulse 67  Ht  (1.626 m)  Wt 264 lb (119.75 kg)  BMI 45.29 kg/m2  SpO2 98%, BMI Body mass index is 45.29 kg/(m^2).  Wt Readings from Last 3 Encounters:  04/29/15 264 lb (119.75 kg)  01/23/15 263 lb 12.8 oz (119.659 kg)  10/23/14 267 lb (121.11 kg)     Obese woman, appears comfortable at rest. HEENT: Conjunctiva and lids normal, oropharynx clear.  Neck: Supple, no elevated JVP, no bruits.  Lungs: Clear to auscultation, nonlabored.  Cardiac:  Indistinct PMI, regular rate and rhythm, no loud systolic murmur or S3.  Abdomen: Obese, bowel sounds present.  Extremities: Trace edema below the knees, symmetrical, distal pulses one plus.  Skin: Warm and dry. Musculoskeletal: No kyphosis. Neuropsychiatric: Alert and oriented x3, affect appropriate.   ECG:  Tracing from 08/21/2014 showed sinus rhythm with low voltage.  Recent Labwork: 04/22/2015: ALT 17; AST 16; BUN 24*; Creatinine 0.78; Hemoglobin 13.1; Platelets 193; Potassium 4.1; Sodium 136   Other Studies Reviewed Today:  Echocardiogram 04/23/2015: Study Conclusions  - Left ventricle: The cavity size was mildly to moderately dilated. Wall thickness was normal. Systolic  function was mildly to moderately reduced. The estimated ejection fraction was in the range of 40% to 45%. Diastolic function is abnormal, indeterminate grade. Diffuse hypokinesis. - Aortic valve: There was trivial regurgitation. Valve area (VTI): 1.88 cm^2. Valve area (Vmax): 1.73 cm^2. Valve area (Vmean): 2.41 cm^2. - Mitral valve: There was mild regurgitation. - Left atrium: The atrium was mildly dilated. - Systemic veins: IVC is small, suggesting low RA pressures and hypovolemia. - Technically adequate study.   Assessment and Plan:  1.  Nonischemic cardiomyopathy , LVEF 40-45% by recent follow-up echocardiogram. She has been clinically stable on medical therapy which includes bisoprolol, Cozaar, and Aldactone. No changes being made at this time. I anticipate that she should be able to proceed with elective left knee surgery with an acceptable perioperative cardiac risk. She should have a preoperative ECG , would observe on telemetry at least for the first 24 hours after surgery. Continue cardiac medications throughout.  2. History of neurocardiogenic syncope, no recent recurrences.  Current medicines were reviewed with the patient today.  Disposition: FU with me in 3 months.   Signed, [name], MD, [facility] 04/29/2015 8:32 AM    [facility] [address] [place], [place], [place] [address] Phone: [phone]; Fax: [phone]

## 2015-04-29 NOTE — Patient Instructions (Signed)
Continue all current medications. Follow up in  3 months 

## 2015-05-02 ENCOUNTER — Encounter (HOSPITAL_COMMUNITY): Payer: Self-pay | Admitting: *Deleted

## 2015-05-02 ENCOUNTER — Encounter (HOSPITAL_COMMUNITY)
Admission: RE | Disposition: A | Discharge status: Home Health | Payer: Self-pay | Source: Ambulatory Visit | Attending: Orthopedic Surgery

## 2015-05-02 ENCOUNTER — Inpatient Hospital Stay (HOSPITAL_COMMUNITY): Payer: BLUE CROSS/BLUE SHIELD | Admitting: Anesthesiology

## 2015-05-02 ENCOUNTER — Inpatient Hospital Stay (HOSPITAL_COMMUNITY): Payer: BLUE CROSS/BLUE SHIELD

## 2015-05-02 ENCOUNTER — Inpatient Hospital Stay (HOSPITAL_COMMUNITY)
Admission: RE | Admit: 2015-05-02 | Discharge: 2015-05-04 | DRG: 470 | Disposition: A | Discharge status: Home Health | Payer: BLUE CROSS/BLUE SHIELD | Source: Ambulatory Visit | Attending: Orthopedic Surgery | Admitting: Orthopedic Surgery

## 2015-05-02 DIAGNOSIS — Z6841 Body Mass Index (BMI) 40.0 and over, adult: Secondary | ICD-10-CM

## 2015-05-02 DIAGNOSIS — I2510 Atherosclerotic heart disease of native coronary artery without angina pectoris: Secondary | ICD-10-CM | POA: Diagnosis present

## 2015-05-02 DIAGNOSIS — Z825 Family history of asthma and other chronic lower respiratory diseases: Secondary | ICD-10-CM | POA: Diagnosis not present

## 2015-05-02 DIAGNOSIS — Z833 Family history of diabetes mellitus: Secondary | ICD-10-CM

## 2015-05-02 DIAGNOSIS — M1712 Unilateral primary osteoarthritis, left knee: Principal | ICD-10-CM | POA: Diagnosis present

## 2015-05-02 DIAGNOSIS — Z09 Encounter for follow-up examination after completed treatment for conditions other than malignant neoplasm: Secondary | ICD-10-CM

## 2015-05-02 DIAGNOSIS — Z8249 Family history of ischemic heart disease and other diseases of the circulatory system: Secondary | ICD-10-CM

## 2015-05-02 DIAGNOSIS — M25562 Pain in left knee: Secondary | ICD-10-CM | POA: Diagnosis present

## 2015-05-02 HISTORY — PX: TOTAL KNEE ARTHROPLASTY: SHX125

## 2015-05-02 LAB — TYPE AND SCREEN
ABO/RH(D): AB NEG
ANTIBODY SCREEN: NEGATIVE

## 2015-05-02 SURGERY — ARTHROPLASTY, KNEE, TOTAL
Anesthesia: Spinal | Site: Knee | Laterality: Left

## 2015-05-02 MED ORDER — ACETAMINOPHEN 10 MG/ML IV SOLN
1000.0000 mg | Freq: Once | INTRAVENOUS | Status: AC
  Administered 2015-05-02: 1000 mg via INTRAVENOUS
  Filled 2015-05-02: qty 100

## 2015-05-02 MED ORDER — PROPOFOL 10 MG/ML IV BOLUS
INTRAVENOUS | Status: AC
  Filled 2015-05-02: qty 20

## 2015-05-02 MED ORDER — CEFAZOLIN SODIUM-DEXTROSE 2-3 GM-% IV SOLR
2.0000 g | INTRAVENOUS | Status: AC
  Administered 2015-05-02: 2 g via INTRAVENOUS

## 2015-05-02 MED ORDER — BUPIVACAINE HCL (PF) 0.75 % IJ SOLN
INTRAMUSCULAR | Status: DC | PRN
  Administered 2015-05-02: 15 mg

## 2015-05-02 MED ORDER — PREGABALIN 75 MG PO CAPS
75.0000 mg | ORAL_CAPSULE | Freq: Two times a day (BID) | ORAL | Status: DC
  Administered 2015-05-02 – 2015-05-04 (×5): 75 mg via ORAL
  Filled 2015-05-02 (×5): qty 1

## 2015-05-02 MED ORDER — MIDAZOLAM HCL 2 MG/2ML IJ SOLN
INTRAMUSCULAR | Status: AC
  Filled 2015-05-02: qty 2

## 2015-05-02 MED ORDER — ACETAMINOPHEN 650 MG RE SUPP: 650.0000 mg | Freq: Four times a day (QID) | RECTAL | Status: DC | PRN

## 2015-05-02 MED ORDER — ASPIRIN EC 325 MG PO TBEC
325.0000 mg | DELAYED_RELEASE_TABLET | Freq: Two times a day (BID) | ORAL | Status: DC
  Administered 2015-05-02 – 2015-05-04 (×4): 325 mg via ORAL
  Filled 2015-05-02 (×6): qty 1

## 2015-05-02 MED ORDER — STERILE WATER FOR IRRIGATION IR SOLN
Status: DC | PRN
  Administered 2015-05-02: 1500 mL

## 2015-05-02 MED ORDER — PROMETHAZINE HCL 25 MG/ML IJ SOLN: 6.2500 mg | INTRAMUSCULAR | Status: DC | PRN

## 2015-05-02 MED ORDER — CEFAZOLIN SODIUM-DEXTROSE 2-3 GM-% IV SOLR
2.0000 g | Freq: Four times a day (QID) | INTRAVENOUS | Status: AC
  Administered 2015-05-02 (×2): 2 g via INTRAVENOUS
  Filled 2015-05-02 (×2): qty 50

## 2015-05-02 MED ORDER — CHLORHEXIDINE GLUCONATE 4 % EX LIQD: 60.0000 mL | Freq: Once | CUTANEOUS | Status: DC

## 2015-05-02 MED ORDER — SODIUM CHLORIDE 0.9 % IJ SOLN
INTRAMUSCULAR | Status: AC
  Filled 2015-05-02: qty 50

## 2015-05-02 MED ORDER — SODIUM CHLORIDE 0.9 % IV SOLN
10.0000 mg | INTRAVENOUS | Status: DC | PRN
  Administered 2015-05-02: 15 ug/min via INTRAVENOUS

## 2015-05-02 MED ORDER — KETOROLAC TROMETHAMINE 30 MG/ML IJ SOLN
INTRAMUSCULAR | Status: AC
  Filled 2015-05-02: qty 1

## 2015-05-02 MED ORDER — ACETAMINOPHEN 325 MG PO TABS
650.0000 mg | ORAL_TABLET | Freq: Four times a day (QID) | ORAL | Status: DC | PRN
  Administered 2015-05-04: 650 mg via ORAL
  Filled 2015-05-02: qty 2

## 2015-05-02 MED ORDER — PHENOL 1.4 % MT LIQD
1.0000 | OROMUCOSAL | Status: DC | PRN
  Filled 2015-05-02: qty 177

## 2015-05-02 MED ORDER — DEXAMETHASONE SODIUM PHOSPHATE 10 MG/ML IJ SOLN
INTRAMUSCULAR | Status: AC
Start: 2015-05-02 — End: 2015-05-02
  Filled 2015-05-02: qty 1

## 2015-05-02 MED ORDER — BUPIVACAINE-EPINEPHRINE (PF) 0.25% -1:200000 IJ SOLN
INTRAMUSCULAR | Status: AC
Start: 2015-05-02 — End: 2015-05-02
  Filled 2015-05-02: qty 30

## 2015-05-02 MED ORDER — ONDANSETRON HCL 4 MG PO TABS: 4.0000 mg | ORAL_TABLET | Freq: Four times a day (QID) | ORAL | Status: DC | PRN

## 2015-05-02 MED ORDER — FENTANYL CITRATE (PF) 100 MCG/2ML IJ SOLN
INTRAMUSCULAR | Status: AC
  Filled 2015-05-02: qty 2

## 2015-05-02 MED ORDER — LACTATED RINGERS IV SOLN: INTRAVENOUS | Status: DC

## 2015-05-02 MED ORDER — SODIUM CHLORIDE 0.9 % IV SOLN: INTRAVENOUS | Status: DC

## 2015-05-02 MED ORDER — FENTANYL CITRATE (PF) 100 MCG/2ML IJ SOLN
25.0000 ug | INTRAMUSCULAR | Status: DC | PRN
  Administered 2015-05-02 (×2): 25 ug via INTRAVENOUS

## 2015-05-02 MED ORDER — HYDROMORPHONE HCL 1 MG/ML IJ SOLN
0.5000 mg | INTRAMUSCULAR | Status: DC | PRN
  Administered 2015-05-02: 0.5 mg via INTRAVENOUS
  Filled 2015-05-02: qty 1

## 2015-05-02 MED ORDER — OXYCODONE HCL ER 10 MG PO T12A
10.0000 mg | EXTENDED_RELEASE_TABLET | Freq: Two times a day (BID) | ORAL | Status: DC
  Administered 2015-05-02 – 2015-05-04 (×5): 10 mg via ORAL
  Filled 2015-05-02 (×5): qty 1

## 2015-05-02 MED ORDER — SPIRONOLACTONE 12.5 MG HALF TABLET
12.5000 mg | ORAL_TABLET | Freq: Every day | ORAL | Status: DC
  Administered 2015-05-02 – 2015-05-04 (×3): 12.5 mg via ORAL
  Filled 2015-05-02 (×3): qty 1

## 2015-05-02 MED ORDER — CEFAZOLIN SODIUM-DEXTROSE 2-3 GM-% IV SOLR
INTRAVENOUS | Status: AC
  Filled 2015-05-02: qty 50

## 2015-05-02 MED ORDER — 0.9 % SODIUM CHLORIDE (POUR BTL) OPTIME
TOPICAL | Status: DC | PRN
  Administered 2015-05-02: 1000 mL

## 2015-05-02 MED ORDER — DEXAMETHASONE SODIUM PHOSPHATE 10 MG/ML IJ SOLN
10.0000 mg | Freq: Once | INTRAMUSCULAR | Status: AC
  Administered 2015-05-03: 10 mg via INTRAVENOUS
  Filled 2015-05-02 (×2): qty 1

## 2015-05-02 MED ORDER — PHENYLEPHRINE HCL 10 MG/ML IJ SOLN
INTRAMUSCULAR | Status: AC
  Filled 2015-05-02: qty 1

## 2015-05-02 MED ORDER — MIDAZOLAM HCL 5 MG/5ML IJ SOLN
INTRAMUSCULAR | Status: DC | PRN
  Administered 2015-05-02: 2 mg via INTRAVENOUS

## 2015-05-02 MED ORDER — HYDROCODONE-ACETAMINOPHEN 5-325 MG PO TABS
1.0000 | ORAL_TABLET | ORAL | Status: DC | PRN
Start: 2015-05-02 — End: 2015-08-04

## 2015-05-02 MED ORDER — MEPERIDINE HCL 50 MG/ML IJ SOLN: 6.2500 mg | INTRAMUSCULAR | Status: DC | PRN

## 2015-05-02 MED ORDER — SODIUM CHLORIDE 0.9 % IR SOLN
Status: DC | PRN
  Administered 2015-05-02: 2000 mL

## 2015-05-02 MED ORDER — ONDANSETRON HCL 4 MG/2ML IJ SOLN: 4.0000 mg | Freq: Four times a day (QID) | INTRAMUSCULAR | Status: DC | PRN

## 2015-05-02 MED ORDER — SODIUM CHLORIDE 0.9 % IV SOLN
INTRAVENOUS | Status: DC
  Administered 2015-05-02: 14:00:00 via INTRAVENOUS

## 2015-05-02 MED ORDER — ISOPROPYL ALCOHOL 70 % SOLN
Status: DC | PRN
  Administered 2015-05-02: 1 via TOPICAL

## 2015-05-02 MED ORDER — ONDANSETRON HCL 4 MG/2ML IJ SOLN
INTRAMUSCULAR | Status: AC
  Filled 2015-05-02: qty 2

## 2015-05-02 MED ORDER — METOCLOPRAMIDE HCL 5 MG/ML IJ SOLN: 5.0000 mg | Freq: Three times a day (TID) | INTRAMUSCULAR | Status: DC | PRN

## 2015-05-02 MED ORDER — HYDROGEN PEROXIDE 3 % EX SOLN
CUTANEOUS | Status: DC | PRN
  Administered 2015-05-02: 1

## 2015-05-02 MED ORDER — SENNA 8.6 MG PO TABS: 2.0000 | ORAL_TABLET | Freq: Every day | ORAL | Status: DC

## 2015-05-02 MED ORDER — PROPOFOL INFUSION 10 MG/ML OPTIME
INTRAVENOUS | Status: DC | PRN
  Administered 2015-05-02: 70 ug/kg/min via INTRAVENOUS

## 2015-05-02 MED ORDER — HYDROCODONE-ACETAMINOPHEN 5-325 MG PO TABS
1.0000 | ORAL_TABLET | ORAL | Status: DC | PRN
  Administered 2015-05-02 – 2015-05-04 (×9): 2 via ORAL
  Filled 2015-05-02 (×10): qty 2

## 2015-05-02 MED ORDER — DOCUSATE SODIUM 100 MG PO CAPS: 100.0000 mg | ORAL_CAPSULE | Freq: Two times a day (BID) | ORAL | Status: DC

## 2015-05-02 MED ORDER — ALUM & MAG HYDROXIDE-SIMETH 200-200-20 MG/5ML PO SUSP: 30.0000 mL | ORAL | Status: DC | PRN

## 2015-05-02 MED ORDER — HYDROMORPHONE HCL 1 MG/ML IJ SOLN
INTRAMUSCULAR | Status: AC
  Filled 2015-05-02: qty 1

## 2015-05-02 MED ORDER — KETOROLAC TROMETHAMINE 30 MG/ML IJ SOLN
INTRAMUSCULAR | Status: DC | PRN
  Administered 2015-05-02: 30 mg

## 2015-05-02 MED ORDER — PHENYLEPHRINE 40 MCG/ML (10ML) SYRINGE FOR IV PUSH (FOR BLOOD PRESSURE SUPPORT)
PREFILLED_SYRINGE | INTRAVENOUS | Status: AC
  Filled 2015-05-02: qty 10

## 2015-05-02 MED ORDER — MENTHOL 3 MG MT LOZG: 1.0000 | LOZENGE | OROMUCOSAL | Status: DC | PRN

## 2015-05-02 MED ORDER — DIPHENHYDRAMINE HCL 12.5 MG/5ML PO ELIX: 12.5000 mg | ORAL_SOLUTION | ORAL | Status: DC | PRN

## 2015-05-02 MED ORDER — PHENYLEPHRINE HCL 10 MG/ML IJ SOLN
INTRAMUSCULAR | Status: DC | PRN
  Administered 2015-05-02 (×5): 80 ug via INTRAVENOUS

## 2015-05-02 MED ORDER — ASPIRIN EC 325 MG PO TBEC: 325.0000 mg | DELAYED_RELEASE_TABLET | Freq: Two times a day (BID) | ORAL | Status: DC

## 2015-05-02 MED ORDER — HYDROMORPHONE HCL 1 MG/ML IJ SOLN
0.2500 mg | INTRAMUSCULAR | Status: DC | PRN
  Administered 2015-05-02: 0.5 mg via INTRAVENOUS
  Administered 2015-05-02 (×2): 0.25 mg via INTRAVENOUS

## 2015-05-02 MED ORDER — LOSARTAN POTASSIUM 25 MG PO TABS
25.0000 mg | ORAL_TABLET | Freq: Every day | ORAL | Status: DC
  Administered 2015-05-02 – 2015-05-04 (×3): 25 mg via ORAL
  Filled 2015-05-02 (×3): qty 1

## 2015-05-02 MED ORDER — FENTANYL CITRATE (PF) 100 MCG/2ML IJ SOLN
INTRAMUSCULAR | Status: DC | PRN
  Administered 2015-05-02: 100 ug via INTRAVENOUS

## 2015-05-02 MED ORDER — METOCLOPRAMIDE HCL 10 MG PO TABS: 5.0000 mg | ORAL_TABLET | Freq: Three times a day (TID) | ORAL | Status: DC | PRN

## 2015-05-02 MED ORDER — METHOCARBAMOL 500 MG PO TABS: 500.0000 mg | ORAL_TABLET | Freq: Four times a day (QID) | ORAL | Status: DC | PRN

## 2015-05-02 MED ORDER — KETOROLAC TROMETHAMINE 15 MG/ML IJ SOLN
15.0000 mg | Freq: Four times a day (QID) | INTRAMUSCULAR | Status: AC
  Administered 2015-05-02 – 2015-05-03 (×4): 15 mg via INTRAVENOUS
  Filled 2015-05-02 (×4): qty 1

## 2015-05-02 MED ORDER — BISOPROLOL FUMARATE 5 MG PO TABS
5.0000 mg | ORAL_TABLET | Freq: Every day | ORAL | Status: DC
  Administered 2015-05-03 – 2015-05-04 (×2): 5 mg via ORAL
  Filled 2015-05-02 (×2): qty 1

## 2015-05-02 MED ORDER — OXYCODONE HCL ER 10 MG PO T12A: 10.0000 mg | EXTENDED_RELEASE_TABLET | ORAL | Status: DC

## 2015-05-02 MED ORDER — METHOCARBAMOL 1000 MG/10ML IJ SOLN
500.0000 mg | Freq: Four times a day (QID) | INTRAVENOUS | Status: DC | PRN
  Administered 2015-05-02: 500 mg via INTRAVENOUS
  Filled 2015-05-02 (×2): qty 5

## 2015-05-02 MED ORDER — SODIUM CHLORIDE 0.9 % IJ SOLN
INTRAMUSCULAR | Status: DC | PRN
  Administered 2015-05-02: 29 mL

## 2015-05-02 MED ORDER — TRANEXAMIC ACID 1000 MG/10ML IV SOLN
1000.0000 mg | INTRAVENOUS | Status: AC
  Administered 2015-05-02: 1000 mg via INTRAVENOUS
  Filled 2015-05-02: qty 10

## 2015-05-02 MED ORDER — BUPIVACAINE-EPINEPHRINE (PF) 0.25% -1:200000 IJ SOLN
INTRAMUSCULAR | Status: DC | PRN
  Administered 2015-05-02: 30 mL

## 2015-05-02 MED ORDER — SENNA 8.6 MG PO TABS
2.0000 | ORAL_TABLET | Freq: Every day | ORAL | Status: DC
  Administered 2015-05-02 – 2015-05-03 (×2): 17.2 mg via ORAL

## 2015-05-02 MED ORDER — LACTATED RINGERS IV SOLN
INTRAVENOUS | Status: DC | PRN
  Administered 2015-05-02 (×3): via INTRAVENOUS

## 2015-05-02 MED ORDER — DOCUSATE SODIUM 100 MG PO CAPS
100.0000 mg | ORAL_CAPSULE | Freq: Two times a day (BID) | ORAL | Status: DC
  Administered 2015-05-02 – 2015-05-04 (×4): 100 mg via ORAL

## 2015-05-02 MED ORDER — SODIUM CHLORIDE 0.9 % IJ SOLN
INTRAMUSCULAR | Status: AC
Start: 2015-05-02 — End: 2015-05-02
  Filled 2015-05-02: qty 50

## 2015-05-02 MED ORDER — ONDANSETRON 8 MG PO TBDP: 8.0000 mg | ORAL_TABLET | Freq: Three times a day (TID) | ORAL | Status: DC | PRN

## 2015-05-02 SURGICAL SUPPLY — 60 items
BAG ZIPLOCK 12X15 (MISCELLANEOUS) IMPLANT
BANDAGE ELASTIC 4 VELCRO ST LF (GAUZE/BANDAGES/DRESSINGS) ×3 IMPLANT
BANDAGE ELASTIC 6 VELCRO ST LF (GAUZE/BANDAGES/DRESSINGS) ×3 IMPLANT
BANDAGE ESMARK 6X9 LF (GAUZE/BANDAGES/DRESSINGS) ×1 IMPLANT
BLADE SAW RECIPROCATING 77.5 (BLADE) ×3 IMPLANT
BNDG ESMARK 6X9 LF (GAUZE/BANDAGES/DRESSINGS) ×3
CAPT KNEE TRIATH TK-4 ×3 IMPLANT
CHLORAPREP W/TINT 26ML (MISCELLANEOUS) ×6 IMPLANT
CUFF TOURN SGL QUICK 34 (TOURNIQUET CUFF) ×2
CUFF TRNQT CYL 34X4X40X1 (TOURNIQUET CUFF) ×1 IMPLANT
DECANTER SPIKE VIAL GLASS SM (MISCELLANEOUS) ×3 IMPLANT
DERMABOND ADVANCED (GAUZE/BANDAGES/DRESSINGS) ×2
DERMABOND ADVANCED .7 DNX12 (GAUZE/BANDAGES/DRESSINGS) ×1 IMPLANT
DRAPE EXTREMITY T 121X128X90 (DRAPE) ×3 IMPLANT
DRAPE LG THREE QUARTER DISP (DRAPES) ×3 IMPLANT
DRAPE POUCH INSTRU U-SHP 10X18 (DRAPES) ×3 IMPLANT
DRAPE SHEET LG 3/4 BI-LAMINATE (DRAPES) ×6 IMPLANT
DRAPE U-SHAPE 47X51 STRL (DRAPES) ×3 IMPLANT
DRSG AQUACEL AG ADV 3.5X10 (GAUZE/BANDAGES/DRESSINGS) ×3 IMPLANT
DRSG TEGADERM 4X4.75 (GAUZE/BANDAGES/DRESSINGS) IMPLANT
ELECT PENCIL ROCKER SW 15FT (MISCELLANEOUS) ×3 IMPLANT
ELECT REM PT RETURN 15FT ADLT (MISCELLANEOUS) ×3 IMPLANT
EVACUATOR 1/8 PVC DRAIN (DRAIN) IMPLANT
FACESHIELD WRAPAROUND (MASK) ×9 IMPLANT
GAUZE SPONGE 4X4 12PLY STRL (GAUZE/BANDAGES/DRESSINGS) ×3 IMPLANT
GLOVE BIO SURGEON STRL SZ8.5 (GLOVE) ×9 IMPLANT
GLOVE BIOGEL PI IND STRL 8.5 (GLOVE) ×4 IMPLANT
GLOVE BIOGEL PI INDICATOR 8.5 (GLOVE) ×8
GOWN SPEC L3 XXLG W/TWL (GOWN DISPOSABLE) ×3 IMPLANT
HANDPIECE INTERPULSE COAX TIP (DISPOSABLE) ×2
HOOD PEEL AWAY FACE SHEILD DIS (HOOD) ×6 IMPLANT
KIT BASIN OR (CUSTOM PROCEDURE TRAY) ×3 IMPLANT
LIQUID BAND (GAUZE/BANDAGES/DRESSINGS) ×6 IMPLANT
MANIFOLD NEPTUNE II (INSTRUMENTS) ×3 IMPLANT
NEEDLE SPNL 18GX3.5 QUINCKE PK (NEEDLE) ×3 IMPLANT
PACK TOTAL JOINT (CUSTOM PROCEDURE TRAY) ×3 IMPLANT
PADDING CAST COTTON 6X4 STRL (CAST SUPPLIES) ×3 IMPLANT
PEN SKIN MARKING BROAD (MISCELLANEOUS) ×3 IMPLANT
POSITIONER SURGICAL ARM (MISCELLANEOUS) ×3 IMPLANT
SAW OSC TIP CART 19.5X105X1.3 (SAW) ×3 IMPLANT
SEALER BIPOLAR AQUA 6.0 (INSTRUMENTS) ×3 IMPLANT
SET HNDPC FAN SPRY TIP SCT (DISPOSABLE) ×1 IMPLANT
SET PAD KNEE POSITIONER (MISCELLANEOUS) ×3 IMPLANT
SOL PREP POV-IOD 4OZ 10% (MISCELLANEOUS) ×3 IMPLANT
SPONGE DRAIN TRACH 4X4 STRL 2S (GAUZE/BANDAGES/DRESSINGS) IMPLANT
SUCTION FRAZIER 12FR DISP (SUCTIONS) ×3 IMPLANT
SUT MNCRL AB 3-0 PS2 18 (SUTURE) ×3 IMPLANT
SUT MON AB 2-0 CT1 36 (SUTURE) ×6 IMPLANT
SUT VIC AB 1 CT1 36 (SUTURE) ×3 IMPLANT
SUT VIC AB 2-0 CT1 27 (SUTURE) ×2
SUT VIC AB 2-0 CT1 TAPERPNT 27 (SUTURE) ×1 IMPLANT
SUT VLOC 180 0 24IN GS25 (SUTURE) ×3 IMPLANT
SYR 50ML LL SCALE MARK (SYRINGE) ×3 IMPLANT
TOWEL OR 17X26 10 PK STRL BLUE (TOWEL DISPOSABLE) ×6 IMPLANT
TOWEL OR NON WOVEN STRL DISP B (DISPOSABLE) ×3 IMPLANT
TOWER CARTRIDGE SMART MIX (DISPOSABLE) IMPLANT
TRAY FOLEY W/METER SILVER 14FR (SET/KITS/TRAYS/PACK) ×3 IMPLANT
WATER STERILE IRR 1500ML POUR (IV SOLUTION) ×3 IMPLANT
WRAP KNEE MAXI GEL POST OP (GAUZE/BANDAGES/DRESSINGS) ×3 IMPLANT
YANKAUER SUCT BULB TIP 10FT TU (MISCELLANEOUS) ×3 IMPLANT

## 2015-05-02 NOTE — Anesthesia Procedure Notes (Signed)
Spinal Patient location during procedure: OR Start time: 05/02/2015 7:28 AM End time: 05/02/2015 7:43 AM Staffing Performed by: resident/CRNA  Spinal Block Patient position: sitting Prep: Betadine Patient monitoring: heart rate, cardiac monitor, continuous pulse ox and blood pressure Approach: midline Location: L3-4 Injection technique: single-shot Needle Needle type: Spinocan  Needle gauge: 22 G Needle length: 9 cm Needle insertion depth: 8 cm Assessment Sensory level: T6

## 2015-05-02 NOTE — Anesthesia Postprocedure Evaluation (Signed)
  Anesthesia Post-op Note  Patient: Dawn Weiss  Procedure(s) Performed: Procedure(s) (LRB): LEFT TOTAL KNEE ARTHROPLASTY (Left)  Patient Location: PACU  Anesthesia Type: Spinal  Level of Consciousness: awake and alert   Airway and Oxygen Therapy: Patient Spontanous Breathing  Post-op Pain: mild  Post-op Assessment: Post-op Vital signs reviewed, Patient's Cardiovascular Status Stable, Respiratory Function Stable, Patent Airway and No signs of Nausea or vomiting  Last Vitals:  Filed Vitals:   05/02/15 1418  BP: 113/50  Pulse: 59  Temp: 37.2 C  Resp: 18    Post-op Vital Signs: stable   Complications: No apparent anesthesia complications

## 2015-05-02 NOTE — H&P (View-Only) (Signed)
TOTAL KNEE ADMISSION H&P  Patient is being admitted for left total knee arthroplasty.  Subjective:  Chief Complaint:left knee pain.  HPI: Dawn Weiss, 59 y.o. female, has a history of pain and functional disability in the left knee due to arthritis and has failed non-surgical conservative treatments for greater than 12 weeks to includeNSAID's and/or analgesics, corticosteriod injections, flexibility and strengthening excercises, use of assistive devices, weight reduction as appropriate and activity modification.  Onset of symptoms was gradual, starting 7 years ago with gradually worsening course since that time. The patient noted no past surgery on the left knee(s).  Patient currently rates pain in the left knee(s) at 10 out of 10 with activity. Patient has night pain, worsening of pain with activity and weight bearing, pain that interferes with activities of daily living, pain with passive range of motion, crepitus and joint swelling.  Patient has evidence of subchondral cysts, subchondral sclerosis, periarticular osteophytes and joint space narrowing by imaging studies. There is no active infection.  Patient Active Problem List   Diagnosis Date Noted  . Vasovagal syncope 09/13/2012  . Morbid obesity 05/31/2011  . 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 Syncope     Neurocardiogenic  . Idiopathic cardiomyopathy     Subsequent normalization of LVEF  . Obesity   . Coronary artery disease     idiopathic cardiomyopathy   . Heart murmur     hx of in childhood   . Shortness of breath dyspnea     pt states relates to weight   . Pneumonia     hx of 2 years ago   . Bronchitis     hx of   . Urinary tract infection     hx of   . Arthritis     Past Surgical History  Procedure Laterality Date  . Microdiscectomy lumbar  11/10    Right L5-S1 / 2010 and 2014  . Left knee arthroscopic surgery    . Abdominal hysterectomy    . Tubal ligation    .  Dilation and curettage of uterus       (Not in a hospital admission) No Known Allergies  History  Substance Use Topics  . Smoking status: Never Smoker   . Smokeless tobacco: Never Used  . Alcohol Use: No    Family History  Problem Relation Age of Onset  . Hypertension Mother   . Heart disease Mother   . Diabetes Mother   . Hypercholesterolemia Mother   . Asthma Father   . Hypertension Father      Review of Systems  Constitutional: Negative.   HENT: Negative.   Eyes: Negative.   Respiratory: Negative.   Cardiovascular: Negative.   Gastrointestinal: Negative.   Musculoskeletal: Negative.   Skin: Negative.   Neurological: Negative.   Endo/Heme/Allergies: Negative.   Psychiatric/Behavioral: Negative.     Objective:  Physical Exam  Constitutional: She is oriented to person, place, and time. She appears well-developed and well-nourished.  HENT:  Head: Normocephalic and atraumatic.  Eyes: Conjunctivae and EOM are normal. Pupils are equal, round, and reactive to light.  Neck: Normal range of motion. Neck supple.  Cardiovascular: Normal rate, regular rhythm, normal heart sounds and intact distal pulses.   No murmur heard. Respiratory: Breath sounds normal. No respiratory distress.  GI: Soft. Bowel sounds are normal. She exhibits no distension. There is no tenderness.  Genitourinary:  Deferred  Musculoskeletal:       Left knee: She  exhibits decreased range of motion, swelling and deformity. Tenderness found. Medial joint line and lateral joint line tenderness noted.  Neurological: She is alert and oriented to person, place, and time. She has normal reflexes.  Skin: Skin is warm and dry.  Psychiatric: She has a normal mood and affect. Her behavior is normal. Judgment and thought content normal.    Vital signs in last 24 hours: @  Labs:   Estimated body mass index is 45.64 kg/(m^2) as calculated from the following:   Height as of 01/23/15:  (1.626 m).    Weight as of an earlier encounter on 04/22/15: 120.657 kg (266 lb).   Imaging Review Plain radiographs demonstrate severe degenerative joint disease of the left knee(s). The overall alignment ismild varus. The bone quality appears to be adequate for age and reported activity level.  Assessment/Plan:  End stage arthritis, left knee   The patient history, physical examination, clinical judgment of the provider and imaging studies are consistent with end stage degenerative joint disease of the left knee(s) and total knee arthroplasty is deemed medically necessary. The treatment options including medical management, injection therapy arthroscopy and arthroplasty were discussed at length. The risks and benefits of total knee arthroplasty were presented and reviewed. The risks due to aseptic loosening, infection, stiffness, patella tracking problems, thromboembolic complications and other imponderables were discussed. The patient acknowledged the explanation, agreed to proceed with the plan and consent was signed. Patient is being admitted for inpatient treatment for surgery, pain control, PT, OT, prophylactic antibiotics, VTE prophylaxis, progressive ambulation and ADL's and discharge planning. The patient is planning to be discharged home with home health services

## 2015-05-02 NOTE — Plan of Care (Signed)
Problem: Consults Goal: Diagnosis- Total Joint Replacement Outcome: Completed/Met Date Met:  05/02/15 Primary Total Knee

## 2015-05-02 NOTE — Discharge Instructions (Signed)
° °Dr. Alize Acy °Total Joint Specialist °[facility] °[address] °[place], [place] [address] °[phone] ° °TOTAL KNEE REPLACEMENT POSTOPERATIVE DIRECTIONS ° ° ° °Knee Rehabilitation, Guidelines Following Surgery  °Results after knee surgery are often greatly improved when you follow the exercise, range of motion and muscle strengthening exercises prescribed by your doctor. Safety measures are also important to protect the knee from further injury. Any time any of these exercises cause you to have increased pain or swelling in your knee joint, decrease the amount until you are comfortable again and slowly increase them. If you have problems or questions, call your caregiver or physical therapist for advice.  ° °WEIGHT BEARING °Weight bearing as tolerated with assist device (walker, cane, etc) as directed, use it as long as suggested by your surgeon or therapist, typically at least 4-6 weeks. ° °HOME CARE INSTRUCTIONS  °Remove items at home which could result in a fall. This includes throw rugs or furniture in walking pathways.  °Continue medications as instructed at time of discharge. °You may have some home medications which will be placed on hold until you complete the course of blood thinner medication.  °You may start showering once you are discharged home but do not submerge the incision under water. Just pat the incision dry and apply a dry gauze dressing on daily. °Walk with walker as instructed.  °You may resume a sexual relationship in one month or when given the OK by your doctor.  °· Use walker as long as suggested by your caregivers. °· Avoid periods of inactivity such as sitting longer than an hour when not asleep. This helps prevent blood clots.  °You may put full weight on your legs and walk as much as is comfortable.  °You may return to work once you are cleared by your doctor.  °Do not drive a car for 6 weeks or until released by you surgeon.  °· Do not drive  while taking narcotics.  °Wear the elastic stockings for three weeks following surgery during the day but you may remove then at night. °Make sure you keep all of your appointments after your operation with all of your doctors and caregivers. You should call the office at the above phone number and make an appointment for approximately two weeks after the date of your surgery. °Do not remove your surgical dressing. The dressing is waterproof; you may take showers in 3 days, but do not take tub baths or submerge the dressing. °Please pick up a stool softener and laxative for home use as long as you are requiring pain medications. °· ICE to the affected knee every three hours for 30 minutes at a time and then as needed for pain and swelling.  Continue to use ice on the knee for pain and swelling from surgery. You may notice swelling that will progress down to the foot and ankle.  This is normal after surgery.  Elevate the leg when you are not up walking on it.   °It is important for you to complete the blood thinner medication as prescribed by your doctor. °· Continue to use the breathing machine which will help keep your temperature down.  It is common for your temperature to cycle up and down following surgery, especially at night when you are not up moving around and exerting yourself.  The breathing machine keeps your lungs expanded and your temperature down. ° °RANGE OF MOTION AND STRENGTHENING EXERCISES  °Rehabilitation of the knee is important following   a knee injury or an operation. After just a few days of immobilization, the muscles of the thigh which control the knee become weakened and shrink (atrophy). Knee exercises are designed to build up the tone and strength of the thigh muscles and to improve knee motion. Often times heat used for twenty to thirty minutes before working out will loosen up your tissues and help with improving the range of motion but do not use heat for the first two weeks following  surgery. These exercises can be done on a training (exercise) mat, on the floor, on a table or on a bed. Use what ever works the best and is most comfortable for you Knee exercises include:  °Leg Lifts - While your knee is still immobilized in a splint or cast, you can do straight leg raises. Lift the leg to 60 degrees, hold for 3 sec, and slowly lower the leg. Repeat 10-20 times 2-3 times daily. Perform this exercise against resistance later as your knee gets better.  °Quad and Hamstring Sets - Tighten up the muscle on the front of the thigh (Quad) and hold for 5-10 sec. Repeat this 10-20 times hourly. Hamstring sets are done by pushing the foot backward against an object and holding for 5-10 sec. Repeat as with quad sets.  °A rehabilitation program following serious knee injuries can speed recovery and prevent re-injury in the future due to weakened muscles. Contact your doctor or a physical therapist for more information on knee rehabilitation.  ° °SKILLED REHAB INSTRUCTIONS: °If the patient is transferred to a skilled rehab facility following release from the hospital, a list of the current medications will be sent to the facility for the patient to continue.  When discharged from the skilled rehab facility, please have the facility set up the patient's Home Health Physical Therapy prior to being released. Also, the skilled facility will be responsible for providing the patient with their medications at time of release from the facility to include their pain medication, the muscle relaxants, and their blood thinner medication. If the patient is still at the rehab facility at time of the two week follow up appointment, the skilled rehab facility will also need to assist the patient in arranging follow up appointment in our office and any transportation needs. ° °MAKE SURE YOU:  °Understand these instructions.  °Will watch your condition.  °Will get help right away if you are not doing well or get worse.   ° ° °Pick up stool softner and laxative for home use following surgery while on pain medications. °Do NOT remove your dressing. You may shower.  °Do not take tub baths or submerge incision under water. °May shower starting three days after surgery. °Please use a clean towel to pat the incision dry following showers. °Continue to use ice for pain and swelling after surgery. °Do not use any lotions or creams on the incision until instructed by your surgeon. ° °

## 2015-05-02 NOTE — Op Note (Signed)
OPERATIVE REPORT  SURGEON: Samson Frederic, MD.   ASSISTANT: Leilani Able, PA-C.  PREOPERATIVE DIAGNOSIS: Left knee arthritis.   POSTOPERATIVE DIAGNOSIS: Left knee arthritis.   PROCEDURE: Left total knee arthroplasty.   IMPLANTS: Stryker Triathlon CR femur, size 4. Stryker Triathlon Tritanium tibia, size 4. X3 polyethelyene insert, size 11 mm, CR. Tritanium 3 button asymmetric patella, size 29 mm.  ANESTHESIA:  Spinal  TOURNIQUET TIME: not used.  ESTIMATED BLOOD LOSS: 400 mL.  ANTIBIOTICS: 2g ancef.  DRAINS: None.  COMPLICATIONS: None   CONDITION: PACU - hemodynamically stable.   BRIEF CLINICAL NOTE: Dawn Weiss is a 59 y.o. female with a long-standing history of Left knee arthritis. After failing conservative management, the patient was indicated for total knee arthroplasty. The risks, benefits, and alternatives to the procedure were explained, and the patient elected to proceed.  PROCEDURE IN DETAIL: Spinal anesthesia was obtained in the pre-op holding area. Once inside the operative room, a foley catheter was inserted. The patient was then positioned, a nonsterile tourniquet was placed, and the lower extremity was prepped and draped in the normal sterile surgical fashion. A time-out was called verifying side and site of surgery. The patient received IV antibiotics within 60 minutes of beginning the procedure. The tourniquet was not utilized.  An anterior approach to the knee was performed utilizing a midvastus arthrotomy. A medial release was performed and the patellar fat pad was excised. A 5-degree distal femur valgus cut was made with an intramedullary guide. A freehand patellar resection was performed, and the patella was sized an prepared with 3 lug holes.  The proximal tibial resection was performed using an extramedullary guide, leaving a bone island for the PCL. The menisci were excised. A spacer block was placed, and the alignment and balance in  extension were confirmed.   The distal femur was sized using the 3-degree external rotation guide referencing the posterior femoral cortex. The appropriate 4-in-1 cutting block was pinned into place, and the rotation was checked using a spacer block in 90 degrees of flexion. The remaining femoral cuts were performed, taking care to protect the MCL.  The tibia was sized and the trial tray was pinned into place. The remaining trail components were inserted. The knee was stable to varus and valgus stress through a full range of motion. The patella tracked centrally, and the PCL was well balanced. The trial components were removed, and the proximal tibial surface was prepared. Final components were impacted into place. The knee was tested for a final time and found to be well balanced.  The wound was copiously irrigated with a dilute betadine solution followed by normal saline with pulse lavage. Marcaine solution was injected into the periarticular soft tissue. The wound was closed in layers using #1 Vicryl and V-Loc for the fascia, 2-0 Vicryl for the subcutaneous fat, 2-0 Monocryl for the deep dermal layer, 3-0 running Monocryl subcuticular Stitch, and Dermabond for the skin. Once the glue was fully dried, an Aquacell Ag and compressive dressing were applied. The patient was transported to the recovery room in stable ondition. Sponge, needle, and instrument counts were correct at the end of the case x2. The patient tolerated the procedure well and there were no known complications.  Please note that a surgical assistant was a medical necessity for this procedure in order to perform it in a safe and expeditious manner. Surgical assistant was necessary to retract the ligaments and vital neurovascular structures to prevent injury to them and also necessary for proper  positioning of the limb to allow for anatomic placement of the prosthesis.

## 2015-05-02 NOTE — Transfer of Care (Signed)
Immediate Anesthesia Transfer of Care Note  Patient: Dawn Weiss  Procedure(s) Performed: Procedure(s): LEFT TOTAL KNEE ARTHROPLASTY (Left)  Patient Location: PACU  Anesthesia Type:Spinal  Level of Consciousness: sedated  Airway & Oxygen Therapy: Patient Spontanous Breathing and Patient connected to face mask oxygen  Post-op Assessment: Report given to RN and Post -op Vital signs reviewed and stable  Post vital signs: Reviewed and stable  Last Vitals:  Filed Vitals:   05/02/15 0529  BP: 137/74  Pulse: 69  Temp: 36.8 C  Resp: 18    Complications: No apparent anesthesia complications

## 2015-05-02 NOTE — Interval H&P Note (Signed)
History and Physical Interval Note:  05/02/2015 7:17 AM  Dawn Weiss  has presented today for surgery, with the diagnosis of LEFT KNEE OA  The various methods of treatment have been discussed with the patient and family. After consideration of risks, benefits and other options for treatment, the patient has consented to  Procedure(s): LEFT TOTAL KNEE ARTHROPLASTY (Left) as a surgical intervention .  The patient's history has been reviewed, patient examined, no change in status, stable for surgery.  I have reviewed the patient's chart and labs.  Questions were answered to the patient's satisfaction.     Kevon Tench, Cloyde Reams

## 2015-05-02 NOTE — Discharge Summary (Signed)
Physician Discharge Summary  Patient ID: Dawn Weiss MRN: [id number] DOB/AGE: 58/19/1957 58 y.o.  Admit date: 05/02/2015 Discharge date: 05/04/2015  Admission Diagnoses:  Primary osteoarthritis of left knee  Discharge Diagnoses:  Principal Problem:   Primary osteoarthritis of left knee   Past Medical History  Diagnosis Date  . Syncope     Neurocardiogenic  . Idiopathic cardiomyopathy     Subsequent normalization of LVEF  . Obesity   . History of pneumonia   . Arthritis     Surgeries: Procedure(s): LEFT TOTAL KNEE ARTHROPLASTY on 05/02/2015   Consultants (if any):    Discharged Condition: Improved  Hospital Course: Dawn Weiss is an 59 y.o. female who was admitted 05/02/2015 with a diagnosis of Primary osteoarthritis of left knee and went to the operating room on 05/02/2015 and underwent the above named procedures.    She was given perioperative antibiotics:  Anti-infectives    Start     Dose/Rate Route Frequency Ordered Stop   05/02/15 1400  ceFAZolin (ANCEF) IVPB 2 g/50 mL premix     2 g 100 mL/hr over 30 Minutes Intravenous Every 6 hours 05/02/15 1227 05/03/15 0159   05/02/15 0529  ceFAZolin (ANCEF) IVPB 2 g/50 mL premix     2 g 100 mL/hr over 30 Minutes Intravenous On call to O.R. 05/02/15 8101 05/02/15 0743    .  She was given sequential compression devices, early ambulation, and ASA for DVT prophylaxis.  She benefited maximally from the hospital stay and there were no complications.    Recent vital signs:  Filed Vitals:   05/02/15 1218  BP: 115/53  Pulse: 55  Temp: 98.2 F (36.8 C)  Resp: 18    Recent laboratory studies:  Lab Results  Component Value Date   HGB 13.1 04/22/2015   HGB 12.7 12/01/2011   HGB 11.7* 11/29/2011   Lab Results  Component Value Date   WBC 9.3 04/22/2015   PLT 193 04/22/2015   Lab Results  Component Value Date   INR 1.00 04/22/2015   Lab Results  Component Value Date   NA 136 04/22/2015   K 4.1 04/22/2015    CL 104 04/22/2015   CO2 26 04/22/2015   BUN 24* 04/22/2015   CREATININE 0.78 04/22/2015   GLUCOSE 228* 04/22/2015    Discharge Medications:     Medication List    STOP taking these medications        ibuprofen 200 MG tablet  Commonly known as:  ADVIL,MOTRIN      TAKE these medications        aspirin EC 325 MG tablet  Take 1 tablet (325 mg total) by mouth 2 (two) times daily after a meal.     bisoprolol 5 MG tablet  Commonly known as:  ZEBETA  Take 1 tablet (5 mg total) by mouth daily.     diclofenac 75 MG EC tablet  Commonly known as:  VOLTAREN  Take 75 mg by mouth 2 (two) times daily.     docusate sodium 100 MG capsule  Commonly known as:  COLACE  Take 1 capsule (100 mg total) by mouth 2 (two) times daily.     HYDROcodone-acetaminophen 5-325 MG per tablet  Commonly known as:  NORCO  Take 1-2 tablets by mouth every 4 (four) hours as needed for moderate pain.     losartan 25 MG tablet  Commonly known as:  COZAAR  Take 1 tablet (25 mg total) by mouth daily.  ondansetron 8 MG disintegrating tablet  Commonly known as:  ZOFRAN ODT  Take 1 tablet (8 mg total) by mouth every 8 (eight) hours as needed for nausea or vomiting.     OxyCODONE 10 mg T12a 12 hr tablet  Commonly known as:  OXYCONTIN  Take 1 tablet (10 mg total) by mouth PRO. 1 tab PO every 12 hours for 3 days, then 1 tab PO daily for 4 days     senna 8.6 MG Tabs tablet  Commonly known as:  SENOKOT  Take 2 tablets (17.2 mg total) by mouth at bedtime.     spironolactone 25 MG tablet  Commonly known as:  ALDACTONE  Take 0.5 tablets (12.5 mg total) by mouth daily.        Diagnostic Studies: Dg Chest 2 View  04/22/2015   CLINICAL DATA:  Total knee replacement.  EXAM: CHEST  2 VIEW  COMPARISON:  None.  FINDINGS: Mediastinum hilar structures normal. Stable cardiomegaly. No pulmonary venous congestion. Low lung volumes with mild basilar atelectasis. No pleural effusion or pneumothorax. No acute bony  abnormality .  IMPRESSION: 1. Stable cardiomegaly.  No pulmonary venous congestion. 2. Low lung volumes.   Electronically Signed   By: Maisie Fus  Register   On: 04/22/2015 09:44   Dg Knee 1-2 Views Left  05/02/2015   CLINICAL DATA:  Left knee replacement  EXAM: LEFT KNEE - 1-2 VIEW  COMPARISON:  01/31/2008  FINDINGS: Two views show total knee replacement on the left. Components appear well positioned without radiographically detectable complication. Intra-articular air is present as expected.  IMPRESSION: Good appearance following total knee replacement on the left.   Electronically Signed   By: Paulina Fusi M.D.   On: 05/02/2015 11:29    Disposition: 01-Home or Self Care      Discharge Instructions    Call MD / Call 911    Complete by:  As directed   If you experience chest pain or shortness of breath, CALL 911 and be transported to the hospital emergency room.  If you develope a fever above 101 F, pus (white drainage) or increased drainage or redness at the wound, or calf pain, call your surgeon's office.     Constipation Prevention    Complete by:  As directed   Drink plenty of fluids.  Prune juice may be helpful.  You may use a stool softener, such as Colace (over the counter) 100 mg twice a day.  Use MiraLax (over the counter) for constipation as needed.     Diet - low sodium heart healthy    Complete by:  As directed      Do not put a pillow under the knee. Place it under the heel.    Complete by:  As directed      Driving restrictions    Complete by:  As directed   No driving for 4 weeks     Increase activity slowly as tolerated    Complete by:  As directed      Lifting restrictions    Complete by:  As directed   No lifting for 6 weeks     TED hose    Complete by:  As directed   Use stockings (TED hose) for 2 weeks on both leg(s).  You may remove them at night for sleeping.           Follow-up Information    Follow up with Ahmya Bernick, Cloyde Reams, MD. Schedule an appointment as  soon as possible  for a visit in 2 weeks.   Specialty:  Orthopedic Surgery   Why:  For wound re-check   Contact information:   [address] [place] [address] [phone]        Signed: Garnet Koyanagi 05/02/2015, 1:13 PM

## 2015-05-02 NOTE — Anesthesia Preprocedure Evaluation (Addendum)
Anesthesia Evaluation  Patient identified by MRN, date of birth, ID band Patient awake    Reviewed: Allergy & Precautions, NPO status , Patient's Chart, lab work & pertinent test results  Airway Mallampati: II  TM Distance: >3 FB Neck ROM: Full    Dental no notable dental hx.    Pulmonary neg pulmonary ROS,  breath sounds clear to auscultation  Pulmonary exam normal       Cardiovascular negative cardio ROS Normal cardiovascular examRhythm:Regular Rate:Normal     Neuro/Psych negative neurological ROS  negative psych ROS   GI/Hepatic negative GI ROS, Neg liver ROS,   Endo/Other  Morbid obesity  Renal/GU negative Renal ROS  negative genitourinary   Musculoskeletal negative musculoskeletal ROS (+)   Abdominal   Peds negative pediatric ROS (+)  Hematology negative hematology ROS (+)   Anesthesia Other Findings   Reproductive/Obstetrics negative OB ROS                            Anesthesia Physical Anesthesia Plan  ASA: II  Anesthesia Plan: Spinal   Post-op Pain Management:    Induction:   Airway Management Planned: Simple Face Mask  Additional Equipment:   Intra-op Plan:   Post-operative Plan:   Informed Consent: I have reviewed the patients History and Physical, chart, labs and discussed the procedure including the risks, benefits and alternatives for the proposed anesthesia with the patient or authorized representative who has indicated his/her understanding and acceptance.   Dental advisory given  Plan Discussed with: CRNA  Anesthesia Plan Comments:         Anesthesia Quick Evaluation

## 2015-05-03 LAB — CBC
HEMATOCRIT: 32.7 % — AB (ref 36.0–46.0)
HEMOGLOBIN: 10.7 g/dL — AB (ref 12.0–15.0)
MCH: 30.8 pg (ref 26.0–34.0)
MCHC: 32.7 g/dL (ref 30.0–36.0)
MCV: 94.2 fL (ref 78.0–100.0)
PLATELETS: 189 10*3/uL (ref 150–400)
RBC: 3.47 MIL/uL — AB (ref 3.87–5.11)
RDW: 13.2 % (ref 11.5–15.5)
WBC: 12.8 10*3/uL — ABNORMAL HIGH (ref 4.0–10.5)

## 2015-05-03 LAB — BASIC METABOLIC PANEL
ANION GAP: 7 (ref 5–15)
BUN: 21 mg/dL — ABNORMAL HIGH (ref 6–20)
CALCIUM: 8.8 mg/dL — AB (ref 8.9–10.3)
CHLORIDE: 106 mmol/L (ref 101–111)
CO2: 27 mmol/L (ref 22–32)
Creatinine, Ser: 0.81 mg/dL (ref 0.44–1.00)
GFR calc Af Amer: >60 mL/min (ref >60)
GFR calc non Af Amer: >60 mL/min (ref >60)
Glucose, Bld: 206 mg/dL — ABNORMAL HIGH (ref 65–99)
Potassium: 4.4 mmol/L (ref 3.5–5.1)
SODIUM: 140 mmol/L (ref 135–145)

## 2015-05-03 MED ORDER — METHOCARBAMOL 500 MG PO TABS
500.0000 mg | ORAL_TABLET | Freq: Four times a day (QID) | ORAL | Status: DC | PRN
  Administered 2015-05-03 – 2015-05-04 (×3): 500 mg via ORAL
  Filled 2015-05-03 (×3): qty 1

## 2015-05-03 NOTE — Evaluation (Signed)
Physical Therapy Evaluation Patient Details Name: Dawn Weiss MRN: [id number] DOB: [birth year] Today's Date: 05/03/2015   History of Present Illness  L tka  Clinical Impression  Patient  Ambulated x 150',L  knee flexion to ~ 60 degrees/ Patient will benefit from PT to address problems listed in note below.    Follow Up Recommendations Home health PT;Supervision - Intermittent    Equipment Recommendations  None recommended by PT    Recommendations for Other Services       Precautions / Restrictions Precautions Precautions: Fall;Knee Restrictions LLE Weight Bearing: Weight bearing as tolerated      Mobility  Bed Mobility Overal bed mobility: Needs Assistance Bed Mobility: Supine to Sit     Supine to sit: Min assist     General bed mobility comments: support L leg   Transfers Overall transfer level: Needs assistance Equipment used: Rolling walker (2 wheeled) Transfers: Sit to/from Stand Sit to Stand: Min assist         General transfer comment: cues for hand and L leg position   Ambulation/Gait Ambulation/Gait assistance: Min assist Ambulation Distance (Feet): 150 Feet Assistive device: Rolling walker (2 wheeled) Gait Pattern/deviations: Step-to pattern;Step-through pattern;Antalgic     General Gait Details: cues for sequence and posture  Stairs            Wheelchair Mobility    Modified Rankin (Stroke Patients Only)       Balance                                             Pertinent Vitals/Pain Pain Assessment: 0-10 Pain Score: 5  Pain Location: L knee Pain Descriptors / Indicators: Aching Pain Intervention(s): Monitored during session;Premedicated before session;Ice applied    Home Living Family/patient expects to be discharged to:: Private residence Living Arrangements: Spouse/significant other Available Help at Discharge: Family Type of Home: House Home Access: Stairs to enter Entrance Stairs-Rails:  None Entrance Stairs-Number of Steps: 3 Home Layout: One level Home Equipment: Environmental consultant - 4 wheels      Prior Function Level of Independence: Independent               Hand Dominance        Extremity/Trunk Assessment               Lower Extremity Assessment: LLE deficits/detail   LLE Deficits / Details: + knee extension ag. gravity in sitting., knee flexion 60      Communication   Communication: No difficulties  Cognition Arousal/Alertness: Awake/alert Behavior During Therapy: WFL for tasks assessed/performed Overall Cognitive Status: Within Functional Limits for tasks assessed                      General Comments      Exercises Total Joint Exercises Long Arc Quad: AROM;Left;10 reps;Seated      Assessment/Plan    PT Assessment Patient needs continued PT services  PT Diagnosis Difficulty walking;Acute pain   PT Problem List Decreased strength;Decreased range of motion;Decreased activity tolerance;Decreased mobility;Decreased knowledge of precautions;Decreased safety awareness;Decreased knowledge of use of DME;Pain  PT Treatment Interventions DME instruction;Gait training;Stair training;Functional mobility training;Therapeutic activities;Patient/family education   PT Goals (Current goals can be found in the Care Plan section) Acute Rehab PT Goals Patient Stated Goal: to walk without pain PT Goal Formulation: With patient Time For Goal Achievement: 05/06/15 Potential to Achieve  Goals: Good    Frequency 7X/week   Barriers to discharge        Co-evaluation               End of Session   Activity Tolerance: Patient tolerated treatment well Patient left: in chair;with call bell/phone within reach Nurse Communication: Mobility status         Time: 5102-5852 PT Time Calculation (min) (ACUTE ONLY): 31 min   Charges:   PT Evaluation $Initial PT Evaluation Tier I: 1 Procedure PT Treatments $Gait Training: 8-22 mins   PT G  Codes:        Rada Hay 05/03/2015, 10:34 AM Blanchard Kelch PT [phone]

## 2015-05-03 NOTE — Care Management Note (Signed)
Case Management Note  Patient Details  Name: YESSENIA [place] MRN: [id number] Date of Birth: Dec 10, 1956  Subjective/Objective:                 Left total knee arthroplasty.   Action/Plan: Home Health  Expected Discharge Date:                  Expected Discharge Plan:  Home w Home Health Services  In-House Referral:     Discharge planning Services  CM Consult  Post Acute Care Choice:  Home Health Choice offered to:  Patient  DME Arranged:    DME Agency:     HH Arranged:  PT HH Agency:  [facility]  Status of Service:  Completed, signed off  Medicare Important Message Given:  No Date Medicare IM Given:    Medicare IM give by:    Date Additional Medicare IM Given:    Additional Medicare Important Message give by:     If discussed at Long Length of Stay Meetings, dates discussed:    Additional Comments: Pt preoperatively arranged with [facility] for [facility]. Offered choice for [facility]. Pt agreeable to [facility] for [facility]. Pt requesting 3n1 and RW for home. Contacted [facility] for DME for home.  Elliot Cousin, RN 05/03/2015, 2:30 PM

## 2015-05-03 NOTE — Evaluation (Signed)
Occupational Therapy Evaluation Patient Details Name: MAHUM GRABEN MRN: [id number] DOB: [birth year] Today's Date: 05/03/2015    History of Present Illness L tka   Clinical Impression   OT education complete.      Follow Up Recommendations  No OT follow up    Equipment Recommendations  3 in 1 bedside comode    Recommendations for Other Services       Precautions / Restrictions Precautions Precautions: Fall;Knee Restrictions LLE Weight Bearing: Weight bearing as tolerated      Mobility Bed Mobility Overal bed mobility: Needs Assistance Bed Mobility: Supine to Sit     Supine to sit: Min assist;Supervision     General bed mobility comments: support L leg   Transfers Overall transfer level: Needs assistance Equipment used: Rolling walker (2 wheeled) Transfers: Sit to/from Stand Sit to Stand: Supervision         General transfer comment: cues for hand and L leg position          ADL Overall ADL's : Needs assistance/impaired Eating/Feeding: Independent;Sitting   Grooming: Set up;Sitting   Upper Body Bathing: Set up;Sitting   Lower Body Bathing: Min guard;Sit to/from stand   Upper Body Dressing : Set up;Sitting   Lower Body Dressing: Min guard;Sit to/from stand   Toilet Transfer: Supervision/safety;RW;BSC   Toileting- Architect and Hygiene: Supervision/safety;Sit to/from stand       Functional mobility during ADLs: Cueing for sequencing;Rolling walker;Supervision/safety;Cueing for safety                 Pertinent Vitals/Pain Pain Assessment: 0-10 Pain Score: 5  Pain Location: L knee Pain Descriptors / Indicators: Aching Pain Intervention(s): Monitored during session;Premedicated before session;Ice applied        Extremity/Trunk Assessment     Lower Extremity Assessment Lower Extremity Assessment: LLE deficits/detail LLE Deficits / Details: + knee extension ag. gravity in sitting., knee flexion 60         Communication Communication Communication: No difficulties   Cognition Arousal/Alertness: Awake/alert Behavior During Therapy: WFL for tasks assessed/performed Overall Cognitive Status: Within Functional Limits for tasks assessed                     General Comments   husband will A at home            Home Living Family/patient expects to be discharged to:: Private residence Living Arrangements: Spouse/significant other Available Help at Discharge: Family Type of Home: House Home Access: Stairs to enter Secretary/administrator of Steps: 3 Entrance Stairs-Rails: None Home Layout: One level               Home Equipment: Environmental consultant - 4 wheels          Prior Functioning/Environment Level of Independence: Independent                      OT Goals(Current goals can be found in the care plan section) Acute Rehab OT Goals Patient Stated Goal: to walk without pain  OT Frequency:     Barriers to D/C:               End of Session Equipment Utilized During Treatment: Rolling walker Nurse Communication: Mobility status  Activity Tolerance: Patient tolerated treatment well Patient left: in bed;Other (comment) (sitting EOB- PT present)   Time: 1340-1355 OT Time Calculation (min): 15 min Charges:  OT General Charges $OT Visit: 1 Procedure OT Evaluation $Initial OT Evaluation Tier I: 1 Procedure G-Codes:  Alba Cory 05/03/2015, 2:05 PM

## 2015-05-03 NOTE — Progress Notes (Signed)
   Subjective: 1 Day Post-Op Procedure(s) (LRB): LEFT TOTAL KNEE ARTHROPLASTY (Left)  Pt c/o rough night pain wise but tolerable Unable to get comfortable  Pain mild to moderate in left knee  Denies any new symptoms or issues Patient reports pain as moderate.  Objective:   VITALS:   Filed Vitals:   05/03/15 0559  BP: 104/56  Pulse: 55  Temp: 98.5 F (36.9 C)  Resp: 18    Left knee dressing in place Ace removed nv intact distally No rashes or erythema  LABS  Recent Labs  05/03/15 0501  HGB 10.7*  HCT 32.7*  WBC 12.8*  PLT 189     Recent Labs  05/03/15 0501  NA 140  K 4.4  BUN 21*  CREATININE 0.81  GLUCOSE 206*     Assessment/Plan: 1 Day Post-Op Procedure(s) (LRB): LEFT TOTAL KNEE ARTHROPLASTY (Left) PT/OT Possible d/c tomorrow depending on progress Pulmonary toilet Pain management    Alphonsa Overall, MPAS, PA-C  05/03/2015, 7:15 AM

## 2015-05-03 NOTE — Progress Notes (Signed)
Physical Therapy Treatment Patient Details Name: Dawn Weiss MRN: [id number] DOB: 1956-04-13 Today's Date: 05/03/2015    History of Present Illness L tka    PT Comments    Patient progressing well.  Follow Up Recommendations  Home health PT;Supervision - Intermittent     Equipment Recommendations  None recommended by PT    Recommendations for Other Services       Precautions / Restrictions Precautions Precautions: Fall;Knee    Mobility  Bed Mobility Overal bed mobility: Needs Assistance Bed Mobility: Sit to Supine     Supine to sit: Min assist;Supervision Sit to supine: Modified independent (Device/Increase time)      Transfers Overall transfer level: Needs assistance Equipment used: Rolling walker (2 wheeled) Transfers: Sit to/from Stand Sit to Stand: Supervision         General transfer comment: cues for hand and L leg position   Ambulation/Gait Ambulation/Gait assistance: Min guard Ambulation Distance (Feet): 150 Feet Assistive device: Rolling walker (2 wheeled) Gait Pattern/deviations: Step-through pattern;Step-to pattern;Antalgic Gait velocity: cues for safety   General Gait Details: cues for sequence and posture   Stairs            Wheelchair Mobility    Modified Rankin (Stroke Patients Only)       Balance                                    Cognition Arousal/Alertness: Awake/alert                          Exercises Total Joint Exercises Quad Sets: AROM;Left;10 reps;Supine Heel Slides: AAROM;Left;10 reps;Supine Straight Leg Raises: AAROM;Left;10 reps;Supine    General Comments        Pertinent Vitals/Pain Pain Score: 8  Pain Location: L knee Pain Descriptors / Indicators: Aching;Burning Pain Intervention(s): Limited activity within patient's tolerance;Patient requesting pain meds-RN notified;Repositioned;Ice applied    Home Living                      Prior Function           PT Goals (current goals can now be found in the care plan section) Acute Rehab PT Goals Patient Stated Goal: to walk without pain Progress towards PT goals: Progressing toward goals    Frequency  7X/week    PT Plan Current plan remains appropriate    Co-evaluation             End of Session   Activity Tolerance: Patient tolerated treatment well Patient left: in bed;with call bell/phone within reach     Time: 1350-1412 PT Time Calculation (min) (ACUTE ONLY): 22 min  Charges:  $Gait Training: 8-22 mins                    G Codes:      Rada Hay 05/03/2015, 4:05 PM

## 2015-05-04 LAB — CBC
HCT: 33.1 % — ABNORMAL LOW (ref 36.0–46.0)
Hemoglobin: 10.5 g/dL — ABNORMAL LOW (ref 12.0–15.0)
MCH: 29.8 pg (ref 26.0–34.0)
MCHC: 31.7 g/dL (ref 30.0–36.0)
MCV: 94 fL (ref 78.0–100.0)
Platelets: 172 10*3/uL (ref 150–400)
RBC: 3.52 MIL/uL — ABNORMAL LOW (ref 3.87–5.11)
RDW: 13.3 % (ref 11.5–15.5)
WBC: 12.9 10*3/uL — AB (ref 4.0–10.5)

## 2015-05-04 NOTE — Progress Notes (Signed)
Physical Therapy Treatment Patient Details Name: ROBBI RAINEY MRN: [id number] DOB: 11-17-1956 Today's Date: 05/04/2015    History of Present Illness L TKA    PT Comments    Progressing well, will return for exercises. To Dc today/  Follow Up Recommendations  Home health PT;Supervision - Intermittent     Equipment Recommendations  Rolling walker with 5" wheels    Recommendations for Other Services       Precautions / Restrictions Precautions Precautions: Knee Restrictions Weight Bearing Restrictions: Yes LLE Weight Bearing: Weight bearing as tolerated    Mobility  Bed Mobility Overal bed mobility: Modified Independent                Transfers   Equipment used: Rolling walker (2 wheeled)   Sit to Stand: Supervision         General transfer comment: cues for hand and L leg position   Ambulation/Gait Ambulation/Gait assistance: Supervision Ambulation Distance (Feet): 150 Feet Assistive device: Rolling walker (2 wheeled) Gait Pattern/deviations: Step-to pattern;Antalgic;Decreased stance time - left Gait velocity: cues for safety   General Gait Details: cues for sequence and posture   Stairs Stairs: Yes Stairs assistance: Min assist Stair Management: Backwards;With walker Number of Stairs: 2 General stair comments: cues for safety and sequence  Wheelchair Mobility    Modified Rankin (Stroke Patients Only)       Balance                                    Cognition Arousal/Alertness: Awake/alert                          Exercises      General Comments        Pertinent Vitals/Pain Pain Score: 5  Pain Location: L knee Pain Descriptors / Indicators: Aching;Tightness Pain Intervention(s): Premedicated before session;Ice applied;Limited activity within patient's tolerance    Home Living                      Prior Function            PT Goals (current goals can now be found in the care plan  section) Progress towards PT goals: Progressing toward goals    Frequency       PT Plan Current plan remains appropriate    Co-evaluation             End of Session   Activity Tolerance: Patient tolerated treatment well Patient left: in bed     Time: 0905-0923 PT Time Calculation (min) (ACUTE ONLY): 18 min  Charges:  $Gait Training: 8-22 mins                    G Codes:      Rada Hay 05/04/2015, 10:25 AM

## 2015-05-04 NOTE — Progress Notes (Signed)
     Subjective: 2 Days Post-Op Procedure(s) (LRB): LEFT TOTAL KNEE ARTHROPLASTY (Left)   Patient reports pain as mild, pain controlled. No events throughout the night. Ready to be discharged home.  Objective:   VITALS:   Filed Vitals:   05/04/15 0530  BP: 110/50  Pulse: 70  Temp: 98.9 F (37.2 C)  Resp: 20    Dorsiflexion/Plantar flexion intact Incision: dressing C/D/I No cellulitis present Compartment soft  LABS  Recent Labs  05/03/15 0501 05/04/15 0500  HGB 10.7* 10.5*  HCT 32.7* 33.1*  WBC 12.8* 12.9*  PLT 189 172     Recent Labs  05/03/15 0501  NA 140  K 4.4  BUN 21*  CREATININE 0.81  GLUCOSE 206*     Assessment/Plan: 2 Days Post-Op Procedure(s) (LRB): LEFT TOTAL KNEE ARTHROPLASTY (Left) May shower prior to d/c home. Up with therapy Discharge home with home health Follow up in 2 weeks at [facility]. Follow up with Dr Linna Caprice in 2 weeks.  Contact information:  [facility] [address], [address] [place] [address] [phone]       Anastasio Auerbach. Emmette Katt   PAC  05/04/2015, 8:22 AM

## 2015-05-05 ENCOUNTER — Encounter (HOSPITAL_COMMUNITY): Payer: Self-pay | Admitting: Orthopedic Surgery

## 2015-05-05 LAB — GLUCOSE, CAPILLARY: Glucose-Capillary: 139 mg/dL — ABNORMAL HIGH (ref 65–99)

## 2015-07-31 ENCOUNTER — Other Ambulatory Visit: Payer: Self-pay | Admitting: *Deleted

## 2015-07-31 MED ORDER — LOSARTAN POTASSIUM 25 MG PO TABS: 25.0000 mg | ORAL_TABLET | Freq: Every day | ORAL | Status: DC

## 2015-07-31 MED ORDER — BISOPROLOL FUMARATE 5 MG PO TABS: 5.0000 mg | ORAL_TABLET | Freq: Every day | ORAL | Status: DC

## 2015-08-04 ENCOUNTER — Encounter: Payer: Self-pay | Admitting: Cardiology

## 2015-08-04 ENCOUNTER — Ambulatory Visit (INDEPENDENT_AMBULATORY_CARE_PROVIDER_SITE_OTHER): Payer: BLUE CROSS/BLUE SHIELD | Admitting: Cardiology

## 2015-08-04 VITALS — BP 100/65 | HR 53 | Ht 65.0 in | Wt 277.0 lb

## 2015-08-04 DIAGNOSIS — Z9189 Other specified personal risk factors, not elsewhere classified: Secondary | ICD-10-CM | POA: Diagnosis not present

## 2015-08-04 DIAGNOSIS — Z87898 Personal history of other specified conditions: Secondary | ICD-10-CM

## 2015-08-04 DIAGNOSIS — I429 Cardiomyopathy, unspecified: Secondary | ICD-10-CM

## 2015-08-04 DIAGNOSIS — I428 Other cardiomyopathies: Secondary | ICD-10-CM

## 2015-08-04 MED ORDER — SPIRONOLACTONE 25 MG PO TABS: 12.5000 mg | ORAL_TABLET | ORAL | Status: DC

## 2015-08-04 MED ORDER — SPIRONOLACTONE 25 MG PO TABS: 25.0000 mg | ORAL_TABLET | ORAL | Status: DC

## 2015-08-04 NOTE — Patient Instructions (Signed)
Your physician recommends that you continue on your current medications as directed. Please refer to the Current Medication list given to you today. Your physician recommends that you return for lab work in: 4 months just before your next visit to check your BMET. We will send the lab order when you call for your appointment. Your physician recommends that you schedule a follow-up appointment in: 4 months. You will receive a reminder letter in the mail in about 1-2 months reminding you to call and schedule your appointment. If you don't receive this letter, please contact our office.

## 2015-08-04 NOTE — Progress Notes (Signed)
Cardiology Office Note  Date: 08/04/2015   ID: Dawn Weiss, DOB [birth year], MRN [id number]  PCP: Louie Boston, MD  Primary Cardiologist: Nona Dell, MD   Chief Complaint  Patient presents with  . Cardiomyopathy    History of Present Illness: Dawn Weiss is a 59 y.o. female last seen in May. She presents for a routine follow-up visit. She has gained weight since I last saw her, attributes this to less activity following knee surgery. She underwent left total knee arthroplasty back in May, uncomplicated hospital course.    She is still having some knee discomfort, ran out of her anti-inflammatory medication, and has been using a cane just recently.   From a cardiac perspective, she denies any chest pain, reports NYHA class II dyspnea. She has had no orthopnea or PND. She does need refills on her cardiac medications.   I encouraged her to try to work on weight loss through diet and activity as tolerated.   Past Medical History  Diagnosis Date  . Syncope     Neurocardiogenic  . Idiopathic cardiomyopathy     Subsequent normalization of LVEF  . Obesity   . History of pneumonia   . Arthritis     Current Outpatient Prescriptions  Medication Sig Dispense Refill  . bisoprolol (ZEBETA) 5 MG tablet Take 1 tablet (5 mg total) by mouth daily. 90 tablet 3  . diclofenac (VOLTAREN) 75 MG EC tablet Take 75 mg by mouth 2 (two) times daily.    Marland Kitchen losartan (COZAAR) 25 MG tablet Take 1 tablet (25 mg total) by mouth daily. 90 tablet 3  . spironolactone (ALDACTONE) 25 MG tablet Take 1 tablet (25 mg total) by mouth every other day. 45 tablet 3   No current facility-administered medications for this visit.    Allergies:  Review of patient's allergies indicates no known allergies.   Social History: The patient  reports that she has never smoked. She has never used smokeless tobacco. She reports that she does not drink alcohol or use illicit drugs.   ROS:  Please see the  history of present illness. Otherwise, complete review of systems is positive for none.  All other systems are reviewed and negative.   Physical Exam: VS:  BP 100/65 mmHg  Pulse 53  Ht 5\' 5"  (1.651 m)  Wt 277 lb (125.646 kg)  BMI 46.10 kg/m2  SpO2 99%, BMI Body mass index is 46.1 kg/(m^2).  Wt Readings from Last 3 Encounters:  08/04/15 277 lb (125.646 kg)  05/02/15 264 lb (119.75 kg)  04/29/15 264 lb (119.75 kg)     Obese woman, appears comfortable at rest. HEENT: Conjunctiva and lids normal, oropharynx clear.  Neck: Supple, no elevated JVP, no bruits.  Lungs: Clear to auscultation, nonlabored.  Cardiac: Indistinct PMI, regular rate and rhythm, no loud systolic murmur or S3.  Abdomen: Obese, bowel sounds present.  Extremities: Trace edema below the knees, symmetrical, distal pulses one plus.  Skin: Warm and dry. Musculoskeletal: No kyphosis. Neuropsychiatric: Alert and oriented x3, affect appropriate.   ECG: ECG is not ordered today.   Recent Labwork: 04/22/2015: ALT 17; AST 16 05/03/2015: BUN 21*; Creatinine, Ser 0.81; Potassium 4.4; Sodium 140 05/04/2015: Hemoglobin 10.5*; Platelets 172   Other Studies Reviewed Today:  Echocardiogram 04/23/2015: Study Conclusions  - Left ventricle: The cavity size was mildly to moderately dilated. Wall thickness was normal. Systolic function was mildly to moderately reduced. The estimated ejection fraction was in the range of 40% to 45%.  Diastolic function is abnormal, indeterminate grade. Diffuse hypokinesis. - Aortic valve: There was trivial regurgitation. Valve area (VTI): 1.88 cm^2. Valve area (Vmax): 1.73 cm^2. Valve area (Vmean): 2.41 cm^2. - Mitral valve: There was mild regurgitation. - Left atrium: The atrium was mildly dilated. - Systemic veins: IVC is small, suggesting low RA pressures and hypovolemia. - Technically adequate study.  Assessment and Plan:  1.  Nonischemic cardiomyopathy with recent LVEF  40-45% on medical therapy. No changes made to current regimen, refills provided. Follow-up arranged with BMET.  2.  History of neurocardiogenic syncope, quiescent.  Current medicines were reviewed with the patient today.  Disposition: FU with me in 4 months.   Signed, [name], MD, [facility] 08/04/2015 8:58 AM    [facility] at [facility] [address] [place], [place], [place] [address] Phone: [phone]; Fax: [phone]

## 2015-11-25 ENCOUNTER — Ambulatory Visit: Payer: BLUE CROSS/BLUE SHIELD | Admitting: Cardiology

## 2015-12-19 ENCOUNTER — Ambulatory Visit: Payer: BLUE CROSS/BLUE SHIELD | Admitting: Cardiology

## 2016-01-02 ENCOUNTER — Ambulatory Visit: Payer: BLUE CROSS/BLUE SHIELD | Admitting: Cardiology

## 2016-01-16 IMAGING — CR DG KNEE 1-2V*L*
1 series · 3 of 3 positions shown · non-contrast
Comparison: 01/31/2008

CLINICAL DATA: Left knee replacement

EXAM:
LEFT KNEE - 1-2 VIEW

[Series 1: AP · left · 3 of 3 slices shown]
[im 1/3]
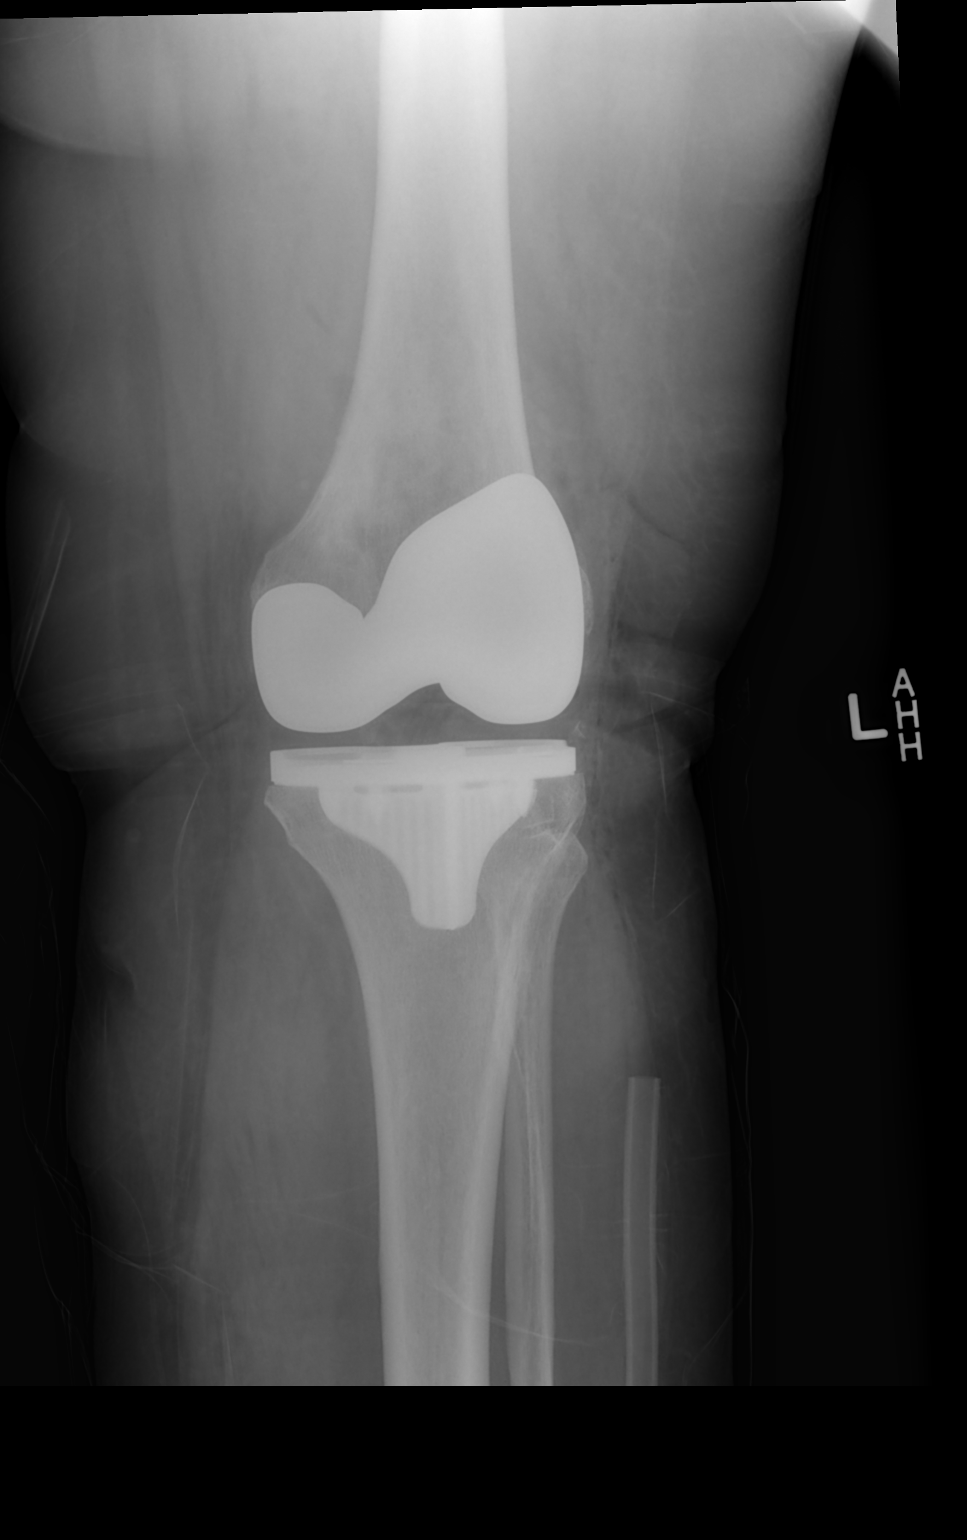
[im 2/3]
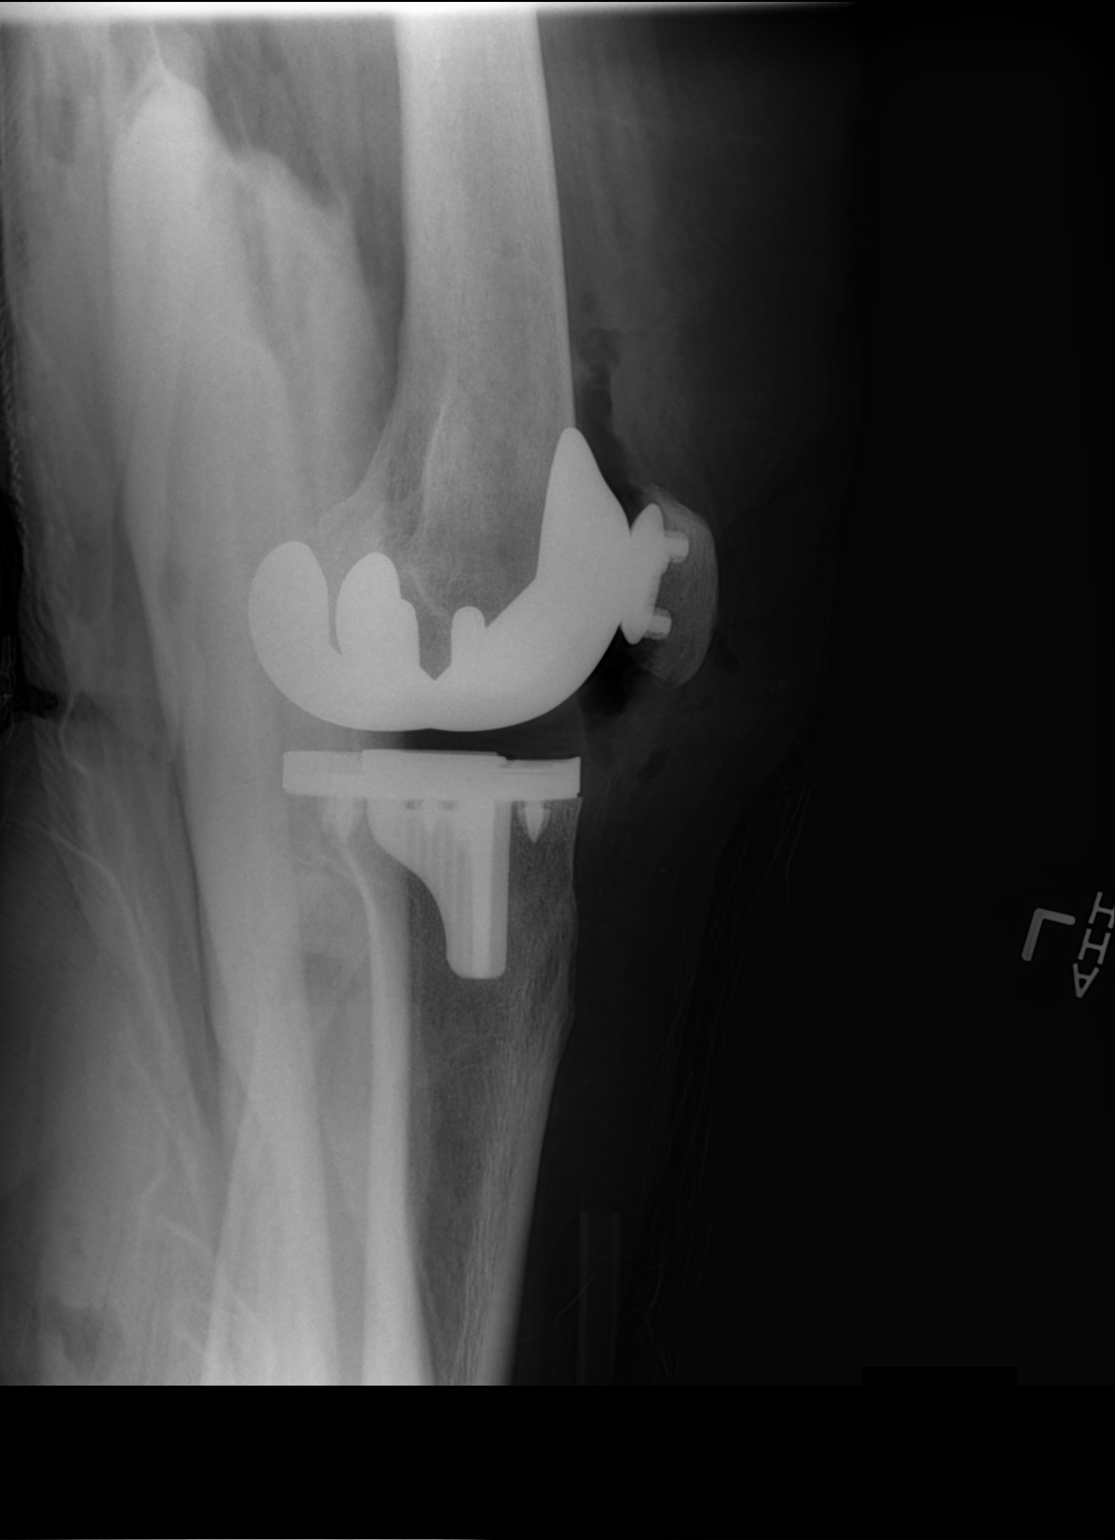
[im 3/3]
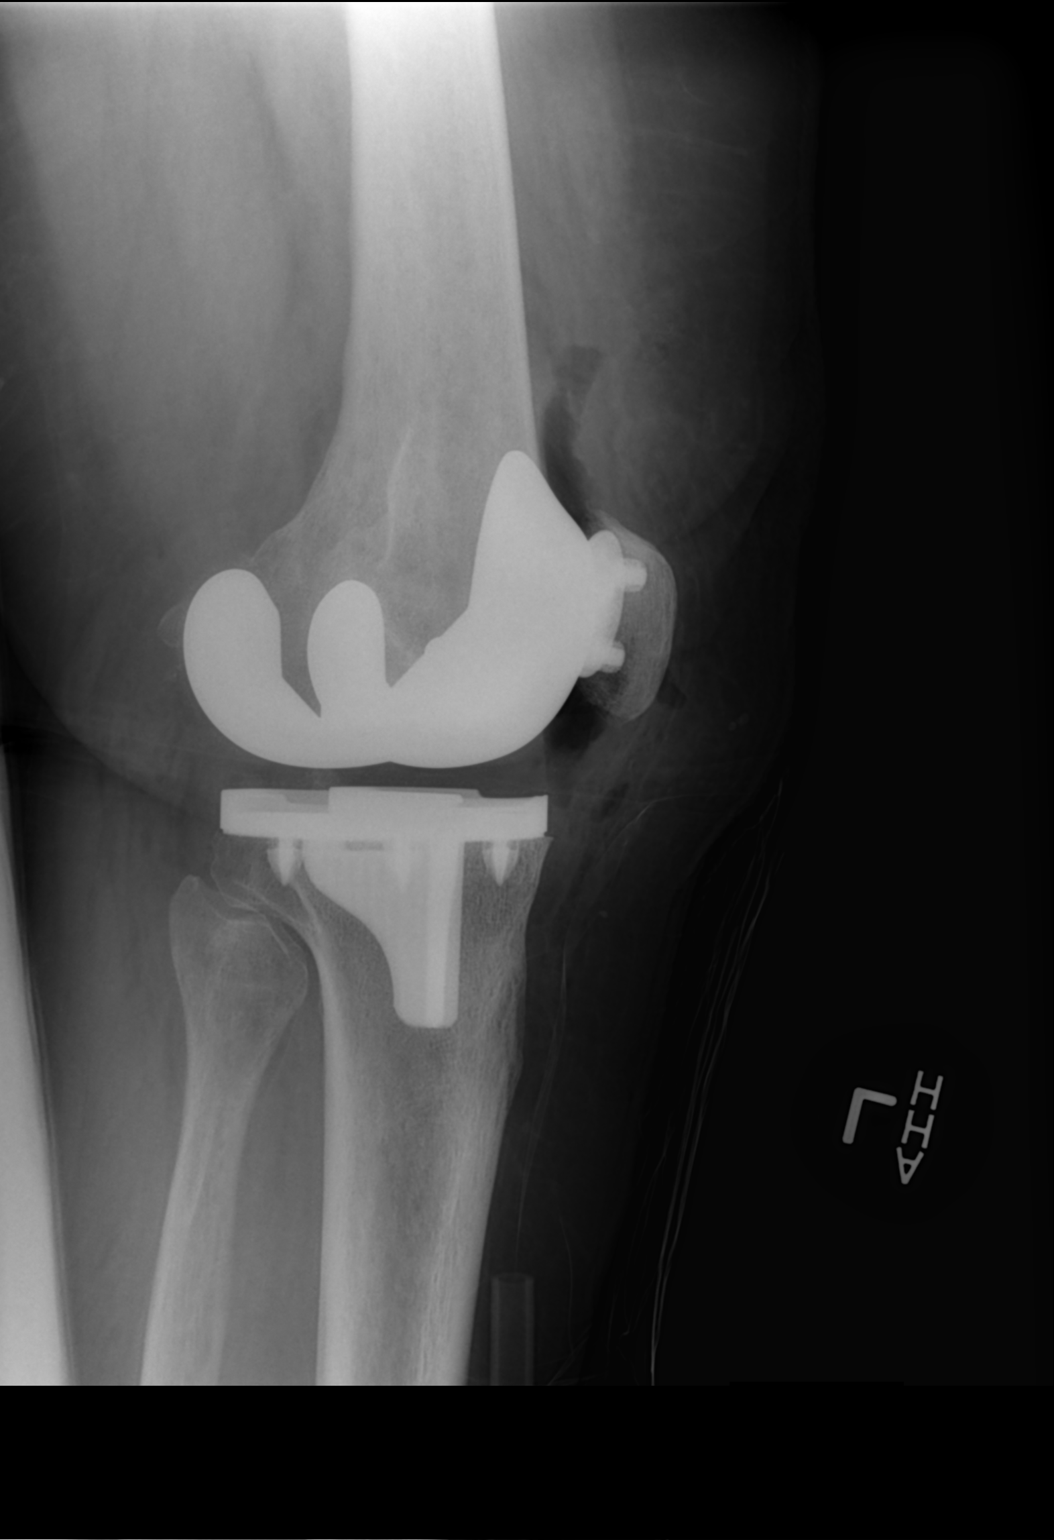

[3 of 3 positions shown; findings below may reference images not displayed]

FINDINGS: Two views show total knee replacement on the left. Components appear
well positioned without radiographically detectable complication.
Intra-articular air is present as expected.
IMPRESSION: Good appearance following total knee replacement on the left.

## 2016-02-23 ENCOUNTER — Encounter: Payer: BLUE CROSS/BLUE SHIELD | Admitting: Cardiology

## 2016-02-23 ENCOUNTER — Encounter: Payer: Self-pay | Admitting: Cardiology

## 2016-02-23 NOTE — Progress Notes (Signed)
Patient canceled.  This encounter was created in error - please disregard. 

## 2016-04-27 ENCOUNTER — Ambulatory Visit: Payer: BLUE CROSS/BLUE SHIELD | Admitting: Cardiology

## 2016-05-03 ENCOUNTER — Ambulatory Visit: Payer: BLUE CROSS/BLUE SHIELD | Admitting: Cardiology

## 2016-05-04 ENCOUNTER — Ambulatory Visit: Payer: BLUE CROSS/BLUE SHIELD | Admitting: Cardiology

## 2016-05-08 ENCOUNTER — Other Ambulatory Visit: Payer: Self-pay | Admitting: Cardiology

## 2016-06-23 ENCOUNTER — Other Ambulatory Visit: Payer: Self-pay | Admitting: *Deleted

## 2016-06-23 MED ORDER — SPIRONOLACTONE 25 MG PO TABS: 25.0000 mg | ORAL_TABLET | ORAL | Status: DC

## 2016-08-12 ENCOUNTER — Encounter: Payer: Self-pay | Admitting: *Deleted

## 2016-08-13 ENCOUNTER — Ambulatory Visit (INDEPENDENT_AMBULATORY_CARE_PROVIDER_SITE_OTHER): Payer: BLUE CROSS/BLUE SHIELD | Admitting: Cardiology

## 2016-08-13 ENCOUNTER — Encounter: Payer: Self-pay | Admitting: Cardiology

## 2016-08-13 VITALS — BP 140/90 | HR 74 | Ht 65.0 in | Wt 272.0 lb

## 2016-08-13 DIAGNOSIS — I428 Other cardiomyopathies: Secondary | ICD-10-CM

## 2016-08-13 DIAGNOSIS — I429 Cardiomyopathy, unspecified: Secondary | ICD-10-CM | POA: Diagnosis not present

## 2016-08-13 DIAGNOSIS — I5042 Chronic combined systolic (congestive) and diastolic (congestive) heart failure: Secondary | ICD-10-CM

## 2016-08-13 DIAGNOSIS — R55 Syncope and collapse: Secondary | ICD-10-CM | POA: Diagnosis not present

## 2016-08-13 DIAGNOSIS — Z1231 Encounter for screening mammogram for malignant neoplasm of breast: Secondary | ICD-10-CM | POA: Diagnosis not present

## 2016-08-13 NOTE — Progress Notes (Signed)
Cardiology Office Note  Date: 08/13/2016   ID: Dawn Weiss, DOB [birth year], MRN [id number]  PCP: Louie Boston, MD  Primary Cardiologist: Nona Dell, MD   Chief Complaint  Patient presents with  . Cardiomyopathy    History of Present Illness: Dawn Weiss is a 60 y.o. female last seen in August 2016. She presents for a routine follow-up visit. Now works as a Diplomatic Services operational officer for [employer]. Reports no major change in stamina, NYHA class II dyspnea, no chest pain or palpitations. Does have occasional spells where she feels lightheaded and vertiginous. No syncope.  She reports compliance with her medications. Has been taking Aldactone 25 mg tablets every other day instead of 12.5 mg daily. I reviewed her ECG today which shows sinus rhythm with PVCs, low voltage, and nonspecific T-wave changes.  She has not had any regular primary care follow-up for some time now. I talked with her about establishing a regular pattern to get a physical with lab work. She has been worried that she might have "diabetes." Otherwise complains of orthopedic symptoms, lower back and leg pain.  Past Medical History:  Diagnosis Date  . Arthritis   . History of pneumonia   . Idiopathic cardiomyopathy (HCC)    Subsequent normalization of LVEF  . Obesity   . Syncope    Neurocardiogenic    Past Surgical History:  Procedure Laterality Date  . ABDOMINAL HYSTERECTOMY    . DILATION AND CURETTAGE OF UTERUS    . Left knee arthroscopic surgery    . MICRODISCECTOMY LUMBAR  11/10   Right L5-S1 / 2010 and 2014  . TOTAL KNEE ARTHROPLASTY Left 05/02/2015   Procedure: LEFT TOTAL KNEE ARTHROPLASTY;  Surgeon: Samson Frederic, MD;  Location: [facility];  Service: Orthopedics;  Laterality: Left;  . TUBAL LIGATION      Current Outpatient Prescriptions  Medication Sig Dispense Refill  . bisoprolol (ZEBETA) 5 MG tablet Take 1 tablet by mouth  daily 90 tablet 0  . losartan (COZAAR) 25 MG tablet Take 1  tablet by mouth  daily 90 tablet 0  . spironolactone (ALDACTONE) 25 MG tablet Take 1 tablet (25 mg total) by mouth every other day. 45 tablet 0   No current facility-administered medications for this visit.    Allergies:  Review of patient's allergies indicates no known allergies.   Social History: The patient  reports that she has never smoked. She has never used smokeless tobacco. She reports that she does not drink alcohol or use drugs.   ROS:  Please see the history of present illness. Otherwise, complete review of systems is positive for chronic lower back pain.  All other systems are reviewed and negative.   Physical Exam: VS:  BP 140/90   Pulse 74   Ht 5\' 5"  (1.651 m)   Wt 272 lb (123.4 kg)   SpO2 98%   BMI 45.26 kg/m , BMI Body mass index is 45.26 kg/m.  Wt Readings from Last 3 Encounters:  08/13/16 272 lb (123.4 kg)  08/04/15 277 lb (125.6 kg)  05/02/15 264 lb (119.7 kg)    Obese woman, appears comfortable at rest. HEENT: Conjunctiva and lids normal, oropharynx clear.  Neck: Supple, no elevated JVP, no bruits.  Lungs: Clear to auscultation, nonlabored.  Cardiac: Indistinct PMI, regular rate and rhythm, no loud systolic murmur or S3.  Abdomen: Obese, bowel sounds present.  Extremities: Trace edema below the knees, symmetrical, distal pulses one plus.  Skin: Warm and dry. Musculoskeletal: No  kyphosis. Neuropsychiatric: Alert and oriented x3, affect appropriate.  ECG: I personally reviewed the tracing from 08/21/2014 which showed sinus rhythm with low voltage and decreased R wave progression.  Recent Labwork:  May 2016: Potassium 4.4, BUN 21, creatinine 0.81, hemoglobin 10.5, platelets 172  Other Studies Reviewed Today:  Echocardiogram 04/23/2015: Study Conclusions  - Left ventricle: The cavity size was mildly to moderately dilated. Wall thickness was normal. Systolic function was mildly to moderately reduced. The estimated ejection fraction was in  the range of 40% to 45%. Diastolic function is abnormal, indeterminate grade. Diffuse hypokinesis. - Aortic valve: There was trivial regurgitation. Valve area (VTI): 1.88 cm^2. Valve area (Vmax): 1.73 cm^2. Valve area (Vmean): 2.41 cm^2. - Mitral valve: There was mild regurgitation. - Left atrium: The atrium was mildly dilated. - Systemic veins: IVC is small, suggesting low RA pressures and hypovolemia. - Technically adequate study.  Assessment and Plan:  1. Chronic combined heart failure, symptomatically stable. Weight is down 5 pounds compared to last year. Reports compliance with her medications. We will obtain a follow-up BMET.  2. Nonischemic cardiomyopathy, last LVEF 40-45% as of May 2016. Follow-up echocardiogram will be obtained.  3. Health maintenance, I recommended that she establish a more regular pattern of evaluation by PCP for physical.  4. Obesity, we continue to discuss diet and weight loss.  5. History of vasovagal syncope. Does report episodes of intermittent lightheadedness but no obvious syncope.  Current medicines were reviewed with the patient today.   Orders Placed This Encounter  Procedures  . EKG 12-Lead  . ECHOCARDIOGRAM COMPLETE    Disposition: Follow-up with me in 6 months.  Signed, Jonelle SidleSamuel G. McDowell, MD, [facility] 08/13/2016 10:26 AM    [facility] [address], [place], [place] [address] Phone: [phone]; Fax: [phone]

## 2016-08-13 NOTE — Patient Instructions (Signed)
Medication Instructions:   Continue all current medications.  Labwork:  BMET - order given today.   Testing/Procedures:  Your physician has requested that you have an echocardiogram. Echocardiography is a painless test that uses sound waves to create images of your heart. It provides your doctor with information about the size and shape of your heart and how well your heart's chambers and valves are working. This procedure takes approximately one hour. There are no restrictions for this procedure.  Follow-Up:  Office will contact with results via phone or letter.    Your physician wants you to follow up in: 6 months.  You will receive a reminder letter in the mail one-two months in advance.  If you don't receive a letter, please call our office to schedule the follow up appointment   Any Other Special Instructions Will Be Listed Below (If Applicable).  If you need a refill on your cardiac medications before your next appointment, please call your pharmacy.

## 2016-08-15 ENCOUNTER — Other Ambulatory Visit: Payer: Self-pay | Admitting: Cardiology

## 2016-08-30 ENCOUNTER — Other Ambulatory Visit: Payer: Self-pay | Admitting: *Deleted

## 2016-08-30 MED ORDER — SPIRONOLACTONE 25 MG PO TABS: 25.0000 mg | ORAL_TABLET | ORAL | 3 refills | Status: DC

## 2016-09-09 ENCOUNTER — Ambulatory Visit (INDEPENDENT_AMBULATORY_CARE_PROVIDER_SITE_OTHER): Payer: BLUE CROSS/BLUE SHIELD

## 2016-09-09 ENCOUNTER — Other Ambulatory Visit: Payer: Self-pay

## 2016-09-09 ENCOUNTER — Encounter: Payer: Self-pay | Admitting: Cardiology

## 2016-09-09 DIAGNOSIS — I429 Cardiomyopathy, unspecified: Secondary | ICD-10-CM | POA: Diagnosis not present

## 2016-09-09 DIAGNOSIS — I5042 Chronic combined systolic (congestive) and diastolic (congestive) heart failure: Secondary | ICD-10-CM | POA: Diagnosis not present

## 2016-09-09 DIAGNOSIS — I428 Other cardiomyopathies: Secondary | ICD-10-CM

## 2016-09-10 ENCOUNTER — Telehealth: Payer: Self-pay | Admitting: *Deleted

## 2016-09-10 NOTE — Telephone Encounter (Signed)
-----   Message from [name], MD sent at 09/09/2016 10:28 AM EDT ----- Results reviewed. Stable findings with LVEF 40-45% range. We will continue with medical therapy. A copy of this test should be forwarded to [facility], MD.

## 2016-09-13 ENCOUNTER — Encounter: Payer: Self-pay | Admitting: *Deleted

## 2016-09-13 NOTE — Telephone Encounter (Signed)
Patient notified of test results and copy sent to PCP

## 2016-09-13 NOTE — Telephone Encounter (Signed)
Patient returned call

## 2016-09-20 DIAGNOSIS — M5416 Radiculopathy, lumbar region: Secondary | ICD-10-CM | POA: Diagnosis not present

## 2016-09-20 DIAGNOSIS — M5412 Radiculopathy, cervical region: Secondary | ICD-10-CM | POA: Diagnosis not present

## 2016-09-20 DIAGNOSIS — M47817 Spondylosis without myelopathy or radiculopathy, lumbosacral region: Secondary | ICD-10-CM | POA: Diagnosis not present

## 2016-09-20 DIAGNOSIS — M4316 Spondylolisthesis, lumbar region: Secondary | ICD-10-CM | POA: Diagnosis not present

## 2016-09-20 DIAGNOSIS — M545 Low back pain: Secondary | ICD-10-CM | POA: Diagnosis not present

## 2016-09-22 ENCOUNTER — Telehealth: Payer: Self-pay | Admitting: *Deleted

## 2016-09-22 NOTE — Telephone Encounter (Signed)
-----   Message from [name], MD sent at 09/22/2016 10:48 AM EDT ----- Results reviewed. Renal function and potassium remain normal. A copy of this test should be forwarded to [facility], MD.

## 2016-09-22 NOTE — Telephone Encounter (Signed)
Husband informed and copy sent to PCP. 

## 2016-09-27 DIAGNOSIS — I10 Essential (primary) hypertension: Secondary | ICD-10-CM | POA: Diagnosis not present

## 2016-09-30 DIAGNOSIS — M5416 Radiculopathy, lumbar region: Secondary | ICD-10-CM | POA: Diagnosis not present

## 2016-09-30 DIAGNOSIS — M5126 Other intervertebral disc displacement, lumbar region: Secondary | ICD-10-CM | POA: Diagnosis not present

## 2016-10-18 DIAGNOSIS — M542 Cervicalgia: Secondary | ICD-10-CM | POA: Diagnosis not present

## 2016-10-18 DIAGNOSIS — I10 Essential (primary) hypertension: Secondary | ICD-10-CM | POA: Diagnosis not present

## 2016-10-18 DIAGNOSIS — Z6841 Body Mass Index (BMI) 40.0 and over, adult: Secondary | ICD-10-CM | POA: Diagnosis not present

## 2016-10-18 DIAGNOSIS — M5412 Radiculopathy, cervical region: Secondary | ICD-10-CM | POA: Diagnosis not present

## 2016-10-29 DIAGNOSIS — M5412 Radiculopathy, cervical region: Secondary | ICD-10-CM | POA: Diagnosis not present

## 2016-10-29 DIAGNOSIS — M50221 Other cervical disc displacement at C4-C5 level: Secondary | ICD-10-CM | POA: Diagnosis not present

## 2016-10-29 DIAGNOSIS — M50222 Other cervical disc displacement at C5-C6 level: Secondary | ICD-10-CM | POA: Diagnosis not present

## 2016-11-08 DIAGNOSIS — M5412 Radiculopathy, cervical region: Secondary | ICD-10-CM | POA: Diagnosis not present

## 2016-11-08 DIAGNOSIS — M5020 Other cervical disc displacement, unspecified cervical region: Secondary | ICD-10-CM | POA: Diagnosis not present

## 2016-11-08 DIAGNOSIS — I10 Essential (primary) hypertension: Secondary | ICD-10-CM | POA: Diagnosis not present

## 2016-11-08 DIAGNOSIS — M542 Cervicalgia: Secondary | ICD-10-CM | POA: Diagnosis not present

## 2016-12-03 DIAGNOSIS — M50222 Other cervical disc displacement at C5-C6 level: Secondary | ICD-10-CM | POA: Diagnosis not present

## 2016-12-03 DIAGNOSIS — M50221 Other cervical disc displacement at C4-C5 level: Secondary | ICD-10-CM | POA: Diagnosis not present

## 2016-12-03 DIAGNOSIS — M50321 Other cervical disc degeneration at C4-C5 level: Secondary | ICD-10-CM | POA: Diagnosis not present

## 2016-12-03 DIAGNOSIS — M4722 Other spondylosis with radiculopathy, cervical region: Secondary | ICD-10-CM | POA: Diagnosis not present

## 2016-12-03 DIAGNOSIS — M50121 Cervical disc disorder at C4-C5 level with radiculopathy: Secondary | ICD-10-CM | POA: Diagnosis not present

## 2016-12-29 DIAGNOSIS — M5412 Radiculopathy, cervical region: Secondary | ICD-10-CM | POA: Diagnosis not present

## 2017-02-07 DIAGNOSIS — M5020 Other cervical disc displacement, unspecified cervical region: Secondary | ICD-10-CM | POA: Diagnosis not present

## 2017-03-04 ENCOUNTER — Ambulatory Visit: Payer: BLUE CROSS/BLUE SHIELD | Admitting: Cardiology

## 2017-08-11 ENCOUNTER — Telehealth: Payer: Self-pay | Admitting: Cardiology

## 2017-08-11 ENCOUNTER — Other Ambulatory Visit: Payer: Self-pay | Admitting: Cardiology

## 2017-08-11 NOTE — Telephone Encounter (Signed)
Numerous attempts to contact patient with recall letters Patient has changed jobs and she is having a hard time Trying to schedule an appointment.

## 2017-11-07 NOTE — Progress Notes (Signed)
Cardiology Office Note  Date: 11/08/2017   ID: Dawn Weiss, DOB 1956-05-21, MRN [id number]  PCP: Louie Boston., MD  Primary Cardiologist: Nona Dell, MD   Chief Complaint  Patient presents with  . Cardiomyopathy    History of Present Illness: Dawn Weiss is a 61 y.o. female last seen in August 2017.  She presents today overdue for follow-up.  She continues to work at [employer] in [place].  She works 6 days a week.  She does not report any angina symptoms or increasing shortness of breath.  She describes a feeling of vertigo intermittently, no definite precipitant.  No syncope.  Also having pain and numbness in her toes bilaterally.  I personally reviewed her ECG which shows sinus rhythm with decreased R wave progression and low voltage.  She reports compliance with her medications.  Cardiac regimen includes Zebeta, Cozaar, and Aldactone.  With the retirement of Dr. Margo Common, she plans to establish with Dr. Neita Carp, her husband's doctor.  She has not had a recent physical.  She does state that she has been dieting and losing weight,  Past Medical History:  Diagnosis Date  . Arthritis   . History of pneumonia   . Idiopathic cardiomyopathy (HCC)    Subsequent normalization of LVEF  . Obesity   . Syncope    Neurocardiogenic    Past Surgical History:  Procedure Laterality Date  . ABDOMINAL HYSTERECTOMY    . DILATION AND CURETTAGE OF UTERUS    . Left knee arthroscopic surgery    . MICRODISCECTOMY LUMBAR  11/10   Right L5-S1 / 2010 and 2014  . TOTAL KNEE ARTHROPLASTY Left 05/02/2015   Procedure: LEFT TOTAL KNEE ARTHROPLASTY;  Surgeon: Samson Frederic, MD;  Location: [facility];  Service: Orthopedics;  Laterality: Left;  . TUBAL LIGATION      Current Outpatient Medications  Medication Sig Dispense Refill  . bisoprolol (ZEBETA) 5 MG tablet TAKE 1 TABLET BY MOUTH  DAILY 90 tablet 3  . losartan (COZAAR) 25 MG tablet TAKE 1 TABLET BY MOUTH  DAILY 90  tablet 3  . spironolactone (ALDACTONE) 25 MG tablet Take 1 tablet (25 mg total) by mouth every other day. 45 tablet 3   No current facility-administered medications for this visit.    Allergies:  Patient has no known allergies.   Social History: The patient  reports that  has never smoked. she has never used smokeless tobacco. She reports that she does not drink alcohol or use drugs.   ROS:  Please see the history of present illness. Otherwise, complete review of systems is positive for chronic back pain.  All other systems are reviewed and negative.   Physical Exam: VS:  BP 118/70   Pulse 85   Ht  (1.651 m)   Wt 243 lb 6.4 oz (110.4 kg)   SpO2 97%   BMI 40.50 kg/m , BMI Body mass index is 40.5 kg/m.  Wt Readings from Last 3 Encounters:  11/08/17 243 lb 6.4 oz (110.4 kg)  08/13/16 272 lb (123.4 kg)  08/04/15 277 lb (125.6 kg)    General: Obese woman, appears comfortable at rest. HEENT: Conjunctiva and lids normal, oropharynx clear. Neck: Supple, no elevated JVP or carotid bruits, no thyromegaly. Lungs: Clear to auscultation, nonlabored breathing at rest. Cardiac: Regular rate and rhythm, no S3 or significant systolic murmur, no pericardial rub. Abdomen: Soft, nontender, bowel sounds present. Extremities: Trace ankle edema, distal pulses 1-2+. Skin: Warm and dry. Musculoskeletal:  No kyphosis. Neuropsychiatric: Alert and oriented x3, affect grossly appropriate.  ECG: I personally reviewed the tracing from 08/13/2016 which showed sinus rhythm with PVCs, low voltage.  Recent Labwork:  September 2017: BUN 11, creatinine 0.68, potassium 4.3  Other Studies Reviewed Today:  Echocardiogram 09/09/2016: Study Conclusions  - Left ventricle: The cavity size was mildly dilated. Systolic   function was mildly to moderately reduced. The estimated ejection   fraction was in the range of 40% to 45%. Diffuse hypokinesis.   Mild posterior wall thickening. Diastolic dysfunction,  grade   indeterminate. Indeterminate filling pressures. - Mitral valve: There was mild regurgitation. - Pulmonic valve: There was mild regurgitation.  Assessment and Plan:  1.  Nonischemic cardiomyopathy, LVEF 40-45% by echocardiogram in September 2017.  Plan to obtain a follow-up echocardiogram.  No changes in current medical regimen for now.  2.  Morbid obesity.  She has been dieting and losing weight, at least 30 pounds.  3.  History of vasovagal syncope.  She reports intermittent vertigo symptoms, no definite syncope.  Blood pressure is normal today.  4.  Foot pain, possibly neuropathic.  I have encouraged her to move ahead with plans to establish with Dr. Neita Carp for a physical with lab work.  Current medicines were reviewed with the patient today.   Orders Placed This Encounter  Procedures  . EKG 12-Lead  . ECHOCARDIOGRAM COMPLETE    Disposition: Follow-up in 6 months in the [place] office.  Signed, [name], MD, [facility] 11/08/2017 4:10 PM    [facility] at [facility] [address] [place], [place], [place] [address] Phone: [phone]; Fax: [phone]

## 2017-11-08 ENCOUNTER — Ambulatory Visit: Payer: BLUE CROSS/BLUE SHIELD | Admitting: Cardiology

## 2017-11-08 ENCOUNTER — Encounter: Payer: Self-pay | Admitting: Cardiology

## 2017-11-08 ENCOUNTER — Telehealth: Payer: Self-pay | Admitting: Cardiology

## 2017-11-08 VITALS — BP 118/70 | HR 85 | Ht 65.0 in | Wt 243.4 lb

## 2017-11-08 DIAGNOSIS — I428 Other cardiomyopathies: Secondary | ICD-10-CM

## 2017-11-08 DIAGNOSIS — M79672 Pain in left foot: Secondary | ICD-10-CM | POA: Diagnosis not present

## 2017-11-08 DIAGNOSIS — R55 Syncope and collapse: Secondary | ICD-10-CM

## 2017-11-08 DIAGNOSIS — M79671 Pain in right foot: Secondary | ICD-10-CM

## 2017-11-08 MED ORDER — SPIRONOLACTONE 25 MG PO TABS: 25.0000 mg | ORAL_TABLET | ORAL | 1 refills | Status: DC

## 2017-11-08 MED ORDER — BISOPROLOL FUMARATE 5 MG PO TABS: 5.0000 mg | ORAL_TABLET | Freq: Every day | ORAL | 1 refills | Status: DC

## 2017-11-08 MED ORDER — LOSARTAN POTASSIUM 25 MG PO TABS: 25.0000 mg | ORAL_TABLET | Freq: Every day | ORAL | 1 refills | Status: DC

## 2017-11-08 NOTE — Telephone Encounter (Signed)
Pre-cert Verification for the following procedure   Echo scheduled for 11-15-17 at [facility]

## 2017-11-08 NOTE — Patient Instructions (Signed)

## 2017-11-15 ENCOUNTER — Ambulatory Visit (HOSPITAL_COMMUNITY)
Admission: RE | Admit: 2017-11-15 | Discharge: 2017-11-15 | Disposition: A | Discharge status: Home / Self Care | Payer: BLUE CROSS/BLUE SHIELD | Source: Ambulatory Visit | Attending: Cardiology | Admitting: Cardiology

## 2017-11-15 ENCOUNTER — Telehealth: Payer: Self-pay

## 2017-11-15 DIAGNOSIS — I5189 Other ill-defined heart diseases: Secondary | ICD-10-CM | POA: Insufficient documentation

## 2017-11-15 DIAGNOSIS — I517 Cardiomegaly: Secondary | ICD-10-CM | POA: Diagnosis not present

## 2017-11-15 DIAGNOSIS — I428 Other cardiomyopathies: Secondary | ICD-10-CM

## 2017-11-15 NOTE — Telephone Encounter (Signed)
Patient notified. New PCP not in place at this time.

## 2017-11-15 NOTE — Telephone Encounter (Signed)
-----   Message from Norva Pavlov, LPN sent at 85/46/2703  2:41 PM EST -----   ----- Message ----- From: [name], MD Sent: 11/15/2017   2:20 PM To: Eustace Moore, LPN  Results reviewed.  LVEF approximately 35-40%.  She was clinically stable at recent visit, continue with present regimen. A copy of this test should be forwarded to Louie Boston., MD.

## 2017-11-15 NOTE — Telephone Encounter (Signed)
LMTCB

## 2017-11-15 NOTE — Progress Notes (Signed)
*  PRELIMINARY RESULTS* Echocardiogram 2D Echocardiogram has been performed.  Jeryl Columbia 11/15/2017, 9:57 AM

## 2017-11-15 NOTE — Telephone Encounter (Signed)
-----   Message from Laketra Bowdish, LPN sent at 11/15/2017  2:41 PM EST -----   ----- Message ----- From: McDowell, Samuel G, MD Sent: 11/15/2017   2:20 PM To: Lydia M Anderson, LPN  Results reviewed.  LVEF approximately 35-40%.  She was clinically stable at recent visit, continue with present regimen. A copy of this test should be forwarded to Tapper, David B., MD. 

## 2018-01-18 DIAGNOSIS — G629 Polyneuropathy, unspecified: Secondary | ICD-10-CM | POA: Diagnosis not present

## 2018-01-18 DIAGNOSIS — R1314 Dysphagia, pharyngoesophageal phase: Secondary | ICD-10-CM | POA: Diagnosis not present

## 2018-01-18 DIAGNOSIS — I43 Cardiomyopathy in diseases classified elsewhere: Secondary | ICD-10-CM | POA: Diagnosis not present

## 2018-01-25 DIAGNOSIS — Z1389 Encounter for screening for other disorder: Secondary | ICD-10-CM | POA: Diagnosis not present

## 2018-01-25 DIAGNOSIS — E1140 Type 2 diabetes mellitus with diabetic neuropathy, unspecified: Secondary | ICD-10-CM | POA: Diagnosis not present

## 2018-01-25 DIAGNOSIS — E1165 Type 2 diabetes mellitus with hyperglycemia: Secondary | ICD-10-CM | POA: Diagnosis not present

## 2018-01-25 DIAGNOSIS — G629 Polyneuropathy, unspecified: Secondary | ICD-10-CM | POA: Diagnosis not present

## 2018-02-15 ENCOUNTER — Other Ambulatory Visit: Payer: Self-pay | Admitting: Cardiology

## 2018-02-15 MED ORDER — SPIRONOLACTONE 25 MG PO TABS: 25.0000 mg | ORAL_TABLET | ORAL | 3 refills | Status: DC

## 2018-02-15 MED ORDER — BISOPROLOL FUMARATE 5 MG PO TABS: 5.0000 mg | ORAL_TABLET | Freq: Every day | ORAL | 3 refills | Status: DC

## 2018-02-15 MED ORDER — LOSARTAN POTASSIUM 25 MG PO TABS: 25.0000 mg | ORAL_TABLET | Freq: Every day | ORAL | 3 refills | Status: DC

## 2018-02-15 NOTE — Telephone Encounter (Signed)
°*  STAT* If patient is at the pharmacy, call can be transferred to refill team.   1. Which medications need to be refilled? All three cardiac meds   2. Which pharmacy/location (including street and city if local pharmacy) is medication to be sent to? [facility]   3. Do they need a 30 day or 90 day supply? 90 day

## 2018-02-15 NOTE — Telephone Encounter (Signed)
Done

## 2018-02-27 DIAGNOSIS — Z6841 Body Mass Index (BMI) 40.0 and over, adult: Secondary | ICD-10-CM | POA: Diagnosis not present

## 2018-02-27 DIAGNOSIS — R05 Cough: Secondary | ICD-10-CM | POA: Diagnosis not present

## 2018-02-27 DIAGNOSIS — E1165 Type 2 diabetes mellitus with hyperglycemia: Secondary | ICD-10-CM | POA: Diagnosis not present

## 2018-02-27 DIAGNOSIS — J069 Acute upper respiratory infection, unspecified: Secondary | ICD-10-CM | POA: Diagnosis not present

## 2018-03-22 DIAGNOSIS — H40003 Preglaucoma, unspecified, bilateral: Secondary | ICD-10-CM | POA: Diagnosis not present

## 2018-03-22 DIAGNOSIS — Z01818 Encounter for other preprocedural examination: Secondary | ICD-10-CM | POA: Diagnosis not present

## 2018-03-22 DIAGNOSIS — H25012 Cortical age-related cataract, left eye: Secondary | ICD-10-CM | POA: Diagnosis not present

## 2018-04-20 DIAGNOSIS — H25812 Combined forms of age-related cataract, left eye: Secondary | ICD-10-CM | POA: Diagnosis not present

## 2018-04-20 DIAGNOSIS — H2512 Age-related nuclear cataract, left eye: Secondary | ICD-10-CM | POA: Diagnosis not present

## 2018-05-04 DIAGNOSIS — H25811 Combined forms of age-related cataract, right eye: Secondary | ICD-10-CM | POA: Diagnosis not present

## 2018-05-04 DIAGNOSIS — H2511 Age-related nuclear cataract, right eye: Secondary | ICD-10-CM | POA: Diagnosis not present

## 2018-05-31 ENCOUNTER — Ambulatory Visit: Payer: BLUE CROSS/BLUE SHIELD | Admitting: Cardiology

## 2018-06-14 DIAGNOSIS — H02413 Mechanical ptosis of bilateral eyelids: Secondary | ICD-10-CM | POA: Diagnosis not present

## 2018-06-28 DIAGNOSIS — H02413 Mechanical ptosis of bilateral eyelids: Secondary | ICD-10-CM | POA: Diagnosis not present

## 2018-08-06 NOTE — Progress Notes (Deleted)
Cardiology Office Note  Date: 08/06/2018   ID: Dawn Weiss, DOB May 19, 1956, MRN [id number]  PCP: Louie Boston., MD  Primary Cardiologist: Nona Dell, MD   No chief complaint on file.   History of Present Illness: Dawn Weiss is a 62 y.o. female last seen in November 2018.  Echocardiogram from November 2018 showed LVEF 35-40% with diffuse hypokinesis.  Past Medical History:  Diagnosis Date  . Arthritis   . History of pneumonia   . Idiopathic cardiomyopathy (HCC)    Subsequent normalization of LVEF  . Obesity   . Syncope    Neurocardiogenic    Past Surgical History:  Procedure Laterality Date  . ABDOMINAL HYSTERECTOMY    . DILATION AND CURETTAGE OF UTERUS    . Left knee arthroscopic surgery    . MICRODISCECTOMY LUMBAR  11/10   Right L5-S1 / 2010 and 2014  . TOTAL KNEE ARTHROPLASTY Left 05/02/2015   Procedure: LEFT TOTAL KNEE ARTHROPLASTY;  Surgeon: Samson Frederic, MD;  Location: [facility];  Service: Orthopedics;  Laterality: Left;  . TUBAL LIGATION      Current Outpatient Medications  Medication Sig Dispense Refill  . bisoprolol (ZEBETA) 5 MG tablet Take 1 tablet (5 mg total) by mouth daily. 90 tablet 3  . losartan (COZAAR) 25 MG tablet Take 1 tablet (25 mg total) by mouth daily. 90 tablet 3  . spironolactone (ALDACTONE) 25 MG tablet Take 1 tablet (25 mg total) by mouth every other day. 45 tablet 3   No current facility-administered medications for this visit.    Allergies:  Patient has no known allergies.   Social History: The patient  reports that she has never smoked. She has never used smokeless tobacco. She reports that she does not drink alcohol or use drugs.   Family History: The patient's family history includes Asthma in her father; Diabetes in her mother; Heart disease in her mother; Hypercholesterolemia in her mother; Hypertension in her father and mother.   ROS:  Please see the history of present illness. Otherwise, complete review of  systems is positive for {NONE DEFAULTED:18576::"none"}.  All other systems are reviewed and negative.   Physical Exam: VS:  There were no vitals taken for this visit., BMI There is no height or weight on file to calculate BMI.  Wt Readings from Last 3 Encounters:  11/08/17 243 lb 6.4 oz (110.4 kg)  08/13/16 272 lb (123.4 kg)  08/04/15 277 lb (125.6 kg)    General: Patient appears comfortable at rest. HEENT: Conjunctiva and lids normal, oropharynx clear with moist mucosa. Neck: Supple, no elevated JVP or carotid bruits, no thyromegaly. Lungs: Clear to auscultation, nonlabored breathing at rest. Cardiac: Regular rate and rhythm, no S3 or significant systolic murmur, no pericardial rub. Abdomen: Soft, nontender, no hepatomegaly, bowel sounds present, no guarding or rebound. Extremities: No pitting edema, distal pulses 2+. Skin: Warm and dry. Musculoskeletal: No kyphosis. Neuropsychiatric: Alert and oriented x3, affect grossly appropriate.  ECG: I personally reviewed the tracing from 11/08/2017 which showed sinus rhythm with decreased R wave progression and low voltage.  Recent Labwork:   September 2017: BUN 11, creatinine 0.68, potassium 4.3  Other Studies Reviewed Today:  Echocardiogram 11/15/2017: Study Conclusions  - Left ventricle: The cavity size was mildly dilated. Wall   thickness was increased in a pattern of mild LVH. Systolic   function was moderately reduced. The estimated ejection fraction   was in the range of 35% to 40%. Diffuse hypokinesis. Doppler  parameters are consistent with abnormal left ventricular   relaxation (grade 1 diastolic dysfunction). - Aortic valve: There was trivial regurgitation. - Mitral valve: There was trivial regurgitation. - Right atrium: Central venous pressure (est): 3 mm Hg. - Atrial septum: No defect or patent foramen ovale was identified. - Tricuspid valve: There was trivial regurgitation. - Pulmonary arteries: Systolic pressure  could not be accurately   estimated. - Pericardium, extracardiac: There was no pericardial effusion.  Impressions:  - Mildly dilated LV with LVEF approximately 35-40%, diffuse   hypokinesis. Grade 1 diastolic dysfunction. Trivial mitral,   aortic, and tricuspid regurgitation.  Assessment and Plan:    Current medicines were reviewed with the patient today.  No orders of the defined types were placed in this encounter.   Disposition:  Signed, [name], MD, [facility] 08/06/2018 5:31 PM    [facility] at [facility] [address] [place], [place], [place] [address] Phone: [phone]; Fax: [phone]

## 2018-08-07 ENCOUNTER — Ambulatory Visit: Payer: BLUE CROSS/BLUE SHIELD | Admitting: Cardiology

## 2018-08-16 DIAGNOSIS — E1140 Type 2 diabetes mellitus with diabetic neuropathy, unspecified: Secondary | ICD-10-CM | POA: Diagnosis not present

## 2018-08-16 DIAGNOSIS — R1314 Dysphagia, pharyngoesophageal phase: Secondary | ICD-10-CM | POA: Diagnosis not present

## 2018-08-16 DIAGNOSIS — I43 Cardiomyopathy in diseases classified elsewhere: Secondary | ICD-10-CM | POA: Diagnosis not present

## 2018-08-16 DIAGNOSIS — E1165 Type 2 diabetes mellitus with hyperglycemia: Secondary | ICD-10-CM | POA: Diagnosis not present

## 2018-08-16 DIAGNOSIS — G629 Polyneuropathy, unspecified: Secondary | ICD-10-CM | POA: Diagnosis not present

## 2018-10-30 NOTE — Progress Notes (Signed)
Cardiology Office Note  Date: 11/01/2018   ID: Dawn Weiss, DOB 11/16/1956, MRN [id number]  PCP: Louie Boston., MD  Primary Cardiologist: Nona Dell, MD   Chief Complaint  Patient presents with  . Cardiomyopathy    History of Present Illness: Dawn Weiss is a 62 y.o. female last seen in November 2018.  She presents overdue for follow-up.  From a cardiac perspective, she does not report any chest pain or palpitations.  She states that she has been taking her medications regularly, as outlined below.  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  Echocardiogram from last year revealed LVEF 35 to 40% with diffuse hypokinesis and mild diastolic dysfunction.  We discussed obtaining an updated study and also potential medication changes, considering Entresto instead of Cozaar depending on LVEF.  Otherwise clinically she has had no obvious fluid gain.  Her weight is up related to dietary factors and also not exercising as regularly.  I personally reviewed her ECG today which shows sinus rhythm with low voltage and PVCs.  Past Medical History:  Diagnosis Date  . Arthritis   . History of pneumonia   . Idiopathic cardiomyopathy (HCC)    Subsequent normalization of LVEF  . Obesity   . Syncope    Neurocardiogenic    Past Surgical History:  Procedure Laterality Date  . ABDOMINAL HYSTERECTOMY    . DILATION AND CURETTAGE OF UTERUS    . Left knee arthroscopic surgery    . MICRODISCECTOMY LUMBAR  11/10   Right L5-S1 / 2010 and 2014  . TOTAL KNEE ARTHROPLASTY Left 05/02/2015   Procedure: LEFT TOTAL KNEE ARTHROPLASTY;  Surgeon: Samson Frederic, MD;  Location: [facility];  Service: Orthopedics;  Laterality: Left;  . TUBAL LIGATION      Current Outpatient Medications  Medication Sig Dispense Refill  . bisoprolol (ZEBETA) 5 MG tablet Take 1 tablet (5 mg total) by mouth daily. 90 tablet 3  . losartan (COZAAR) 25 MG tablet Take 1 tablet (25 mg total) by mouth daily.  90 tablet 3  . metFORMIN (GLUCOPHAGE) 500 MG tablet 500 mg. Take 2 tabs by mouth every morning & 1 tab every evening    . pantoprazole (PROTONIX) 40 MG tablet Take 1 tablet by mouth daily.    . pioglitazone (ACTOS) 15 MG tablet Take 1 tablet by mouth daily.    . pravastatin (PRAVACHOL) 10 MG tablet Take 1 tablet by mouth daily.    Marland Kitchen spironolactone (ALDACTONE) 25 MG tablet Take 1 tablet (25 mg total) by mouth every other day. 45 tablet 3   No current facility-administered medications for this visit.    Allergies:  Patient has no known allergies.   Social History: The patient  reports that she has never smoked. She has never used smokeless tobacco. She reports that she does not drink alcohol or use drugs.   ROS:  Please see the history of present illness. Otherwise, complete review of systems is positive for none.  All other systems are reviewed and negative.   Physical Exam: VS:  BP 118/72   Pulse 74   Ht 5\' 5"  (1.651 m)   Wt 278 lb 6.4 oz (126.3 kg)   SpO2 98%   BMI 46.33 kg/m , BMI Body mass index is 46.33 kg/m.  Wt Readings from Last 3 Encounters:  11/01/18 278 lb 6.4 oz (126.3 kg)  11/08/17 243 lb 6.4 oz (110.4 kg)  08/13/16 272 lb (123.4 kg)    General:  Morbidly obese woman, appears comfortable at rest. HEENT: Conjunctiva and lids normal, oropharynx clear. Neck: Supple, no elevated JVP or carotid bruits, no thyromegaly. Lungs: Clear to auscultation, nonlabored breathing at rest. Cardiac: Regular rate and rhythm, no S3 or significant systolic murmur. Abdomen: Soft, nontender, bowel sounds present. Extremities: Trace ankle edema, distal pulses 1-2+. Skin: Warm and dry. Musculoskeletal: No kyphosis. Neuropsychiatric: Alert and oriented x3, affect grossly appropriate.  ECG: I personally reviewed the tracing from 11/08/2017 which showed sinus rhythm with decreased R wave progression and low voltage.  Other Studies Reviewed Today:  Echocardiogram 11/15/2017: Study  Conclusions  - Left ventricle: The cavity size was mildly dilated. Wall   thickness was increased in a pattern of mild LVH. Systolic   function was moderately reduced. The estimated ejection fraction   was in the range of 35% to 40%. Diffuse hypokinesis. Doppler   parameters are consistent with abnormal left ventricular   relaxation (grade 1 diastolic dysfunction). - Aortic valve: There was trivial regurgitation. - Mitral valve: There was trivial regurgitation. - Right atrium: Central venous pressure (est): 3 mm Hg. - Atrial septum: No defect or patent foramen ovale was identified. - Tricuspid valve: There was trivial regurgitation. - Pulmonary arteries: Systolic pressure could not be accurately   estimated. - Pericardium, extracardiac: There was no pericardial effusion.  Impressions:  - Mildly dilated LV with LVEF approximately 35-40%, diffuse   hypokinesis. Grade 1 diastolic dysfunction. Trivial mitral,   aortic, and tricuspid regurgitation.  Assessment and Plan:  1.  Nonischemic cardiomyopathy, LVEF 35 to 40% range by echocardiogram last year.  Plan is to continue current regimen, obtain follow-up echocardiogram.  We will consider switching from Cozaar to [facility] depending on follow-up LVEF.  She has otherwise been clinically stable.  2.  Morbid obesity.  She gained back the weight that she had previously lost.  We discussed diet and exercise.  3.  History of vasovagal syncope.  She does not report any recurrent events.  ECG reviewed today.  4.  Patient reported interval diagnosis of type 2 diabetes mellitus, followed by PCP.  She is now on Glucophage and Actos.  Current medicines were reviewed with the patient today.   Orders Placed This Encounter  Procedures  . EKG 12-Lead  . ECHOCARDIOGRAM COMPLETE    Disposition: Follow-up in 6 months.  Signed, [name], MD, [facility] 11/01/2018 11:32 AM    [facility] at [facility] [address]  [place], [place], [place] [address] Phone: [phone]; Fax: [phone]

## 2018-11-01 ENCOUNTER — Encounter: Payer: Self-pay | Admitting: Cardiology

## 2018-11-01 ENCOUNTER — Ambulatory Visit: Payer: BLUE CROSS/BLUE SHIELD | Admitting: Cardiology

## 2018-11-01 VITALS — BP 118/72 | HR 74 | Ht 65.0 in | Wt 278.4 lb

## 2018-11-01 DIAGNOSIS — I428 Other cardiomyopathies: Secondary | ICD-10-CM | POA: Diagnosis not present

## 2018-11-01 DIAGNOSIS — E119 Type 2 diabetes mellitus without complications: Secondary | ICD-10-CM

## 2018-11-01 DIAGNOSIS — R55 Syncope and collapse: Secondary | ICD-10-CM

## 2018-11-01 NOTE — Patient Instructions (Addendum)

## 2018-11-08 DIAGNOSIS — M545 Low back pain: Secondary | ICD-10-CM | POA: Diagnosis not present

## 2018-11-08 DIAGNOSIS — M4316 Spondylolisthesis, lumbar region: Secondary | ICD-10-CM | POA: Diagnosis not present

## 2018-11-08 DIAGNOSIS — M5416 Radiculopathy, lumbar region: Secondary | ICD-10-CM | POA: Diagnosis not present

## 2018-11-08 DIAGNOSIS — I10 Essential (primary) hypertension: Secondary | ICD-10-CM | POA: Diagnosis not present

## 2018-11-14 DIAGNOSIS — I10 Essential (primary) hypertension: Secondary | ICD-10-CM | POA: Diagnosis not present

## 2018-11-15 ENCOUNTER — Other Ambulatory Visit: Payer: BLUE CROSS/BLUE SHIELD

## 2018-11-15 ENCOUNTER — Telehealth: Payer: Self-pay | Admitting: Cardiology

## 2018-11-15 DIAGNOSIS — M5416 Radiculopathy, lumbar region: Secondary | ICD-10-CM | POA: Diagnosis not present

## 2018-11-15 DIAGNOSIS — M4316 Spondylolisthesis, lumbar region: Secondary | ICD-10-CM | POA: Diagnosis not present

## 2018-11-15 DIAGNOSIS — M545 Low back pain: Secondary | ICD-10-CM | POA: Diagnosis not present

## 2018-11-15 DIAGNOSIS — M5127 Other intervertebral disc displacement, lumbosacral region: Secondary | ICD-10-CM | POA: Diagnosis not present

## 2018-11-15 DIAGNOSIS — R030 Elevated blood-pressure reading, without diagnosis of hypertension: Secondary | ICD-10-CM | POA: Diagnosis not present

## 2018-11-15 DIAGNOSIS — M4807 Spinal stenosis, lumbosacral region: Secondary | ICD-10-CM | POA: Diagnosis not present

## 2018-11-15 NOTE — Telephone Encounter (Signed)
Left message to return call 

## 2018-11-15 NOTE — Telephone Encounter (Signed)
Patient have back surgery on Nov 30, 2018 She wants to know what she needs to do about ECHO that is scheduled on 12/18.   I offered Dec 5 but she is not able to do any do but Wednesdays due to work.

## 2018-11-17 DIAGNOSIS — J209 Acute bronchitis, unspecified: Secondary | ICD-10-CM | POA: Diagnosis not present

## 2018-11-17 DIAGNOSIS — Z6841 Body Mass Index (BMI) 40.0 and over, adult: Secondary | ICD-10-CM | POA: Diagnosis not present

## 2018-11-17 DIAGNOSIS — J019 Acute sinusitis, unspecified: Secondary | ICD-10-CM | POA: Diagnosis not present

## 2018-11-24 NOTE — Telephone Encounter (Signed)
Left message to return call 

## 2018-11-24 NOTE — Telephone Encounter (Signed)
Is it okay for patient to push back Echo a little farther due to her upcoming back surgery?

## 2018-11-24 NOTE — Telephone Encounter (Signed)
She could just have it done at [facility] on a Wednesday.

## 2018-11-24 NOTE — Telephone Encounter (Signed)
Patient returned call.  Stated she would just wait till after her surgery as she prefers not to do at [facility].  The last time she had done there her insurance charged her like an outpatient and she had to pay $200.00 out of pocket.  She will call back after surgery to get rescheduled here in [place].

## 2018-11-28 ENCOUNTER — Other Ambulatory Visit: Payer: Self-pay | Admitting: Neurosurgery

## 2018-11-29 ENCOUNTER — Encounter (HOSPITAL_COMMUNITY): Payer: Self-pay | Admitting: Vascular Surgery

## 2018-11-29 ENCOUNTER — Other Ambulatory Visit: Payer: Self-pay

## 2018-11-29 MED ORDER — DEXTROSE 5 % IV SOLN
3.0000 g | INTRAVENOUS | Status: AC
  Administered 2018-11-30: 3 g via INTRAVENOUS
  Filled 2018-11-29: qty 3

## 2018-11-29 NOTE — Anesthesia Preprocedure Evaluation (Addendum)
Anesthesia Evaluation  Patient identified by MRN, date of birth, ID band Patient awake    Reviewed: Allergy & Precautions, NPO status , Patient's Chart, lab work & pertinent test results, reviewed documented beta blocker date and time   Airway Mallampati: II  TM Distance: >3 FB Neck ROM: Full    Dental  (+) Teeth Intact, Dental Advisory Given   Pulmonary pneumonia,    breath sounds clear to auscultation       Cardiovascular negative cardio ROS   Rhythm:Regular Rate:Normal     Neuro/Psych    GI/Hepatic   Endo/Other  diabetes, Well Controlled, Type 2, Oral Hypoglycemic Agents  Renal/GU      Musculoskeletal  (+) Arthritis ,   Abdominal   Peds  Hematology   Anesthesia Other Findings   Reproductive/Obstetrics                           Anesthesia Physical Anesthesia Plan  ASA: III  Anesthesia Plan: General   Post-op Pain Management:    Induction: Intravenous  PONV Risk Score and Plan: 3 and Ondansetron, Dexamethasone and Midazolam  Airway Management Planned: Oral ETT  Additional Equipment: None  Intra-op Plan:   Post-operative Plan: Extubation in OR  Informed Consent: I have reviewed the patients History and Physical, chart, labs and discussed the procedure including the risks, benefits and alternatives for the proposed anesthesia with the patient or authorized representative who has indicated his/her understanding and acceptance.   Dental advisory given  Plan Discussed with: Anesthesiologist, CRNA and Surgeon  Anesthesia Plan Comments: (PAT note written 11/29/2018 by Shonna Chock, PA-C. She is a same day work-up. "Moderate" CV risk per Dr. Diona Browner. )       Anesthesia Quick Evaluation

## 2018-11-29 NOTE — Progress Notes (Signed)
Anesthesia Chart Review:   Case:  [id number] Date/Time:  11/30/18 0715   Procedure:  Redo Right Lumbar 5 Sacral 1 Microdiscectomy (Right ) - Redo Right Lumbar 5 Sacral 1 Microdiscectomy   Anesthesia type:  General   Pre-op diagnosis:  Spondylolisthesis, Lumbar region   [facility]  [facility] RO[facility] 20 / [facility]   Surgeon:  Maeola Harman, MD      DISCUSSION: Patient is a 62 year old female scheduled for the above procedure. It looks like case was moved to [facility] OR from [facility].  History includes never smoker, DM2, idiopathic nonischemic cardiomyopathy, neurocardiogenic syncope. (12/30/06 note by Dr. Diona Browner indicates that patient was diagnosed with idioopathic cardiomyopathy and neurocardiogenic syncope prior to 2008--possibly dating back to 2003. He mentions prior work-up at [facility], although records are not currently available.) Last BMI documented was 46.3, consistent with morbid obesity.   Cardiologist Dr. Diona Browner saw her last on 11/01/18 and felt to be clinically stable at that time. No recurrent syncope. He mentioned a follow-up echo and would consider further medication adjustment if needed based on EF results. Telephone encounters note that echo is going to be scheduled after her surgery. On 11/28/18, Dr. Diona Browner signed a form indicating patient was "moderate" CV risk for planned surgery.   She is a same day work-up, so she will need labs and further anesthesia evaluation on the day of surgery. Will request last office note and labs from PCP Dr. Neita Carp in hopes to have A1c and comparison lab work available.    PROVIDERS: Sasser, Clarene Critchley, MD is PCP ([facility]) Nona Dell, MD is cardiologist   LABS: She is a same day work-up, so she will get labs on arrival.    EKG: 11/01/18 ([facility]): SR with occasional PVCs. Low voltage QRS. Small Q waves in inferior leads, probably insignificant (and unchanged from prior tracing).   CV: Echo  11/15/17: Study Conclusions - Left ventricle: The cavity size was mildly dilated. Wall   thickness was increased in a pattern of mild LVH. Systolic   function was moderately reduced. The estimated ejection fraction   was in the range of 35% to 40%. Diffuse hypokinesis. Doppler   parameters are consistent with abnormal left ventricular   relaxation (grade 1 diastolic dysfunction). - Aortic valve: There was trivial regurgitation. - Mitral valve: There was trivial regurgitation. - Right atrium: Central venous pressure (est): 3 mm Hg. - Atrial septum: No defect or patent foramen ovale was identified. - Tricuspid valve: There was trivial regurgitation. - Pulmonary arteries: Systolic pressure could not be accurately   estimated. - Pericardium, extracardiac: There was no pericardial effusion. Impressions: - Mildly dilated LV with LVEF approximately 35-40%, diffuse   hypokinesis. Grade 1 diastolic dysfunction. Trivial mitral,   aortic, and tricuspid regurgitation. (Comparison EF 40-35% 09/09/16, 40-45% 04/23/15, 40% 09/18/14, 08/30/12 & 01/12/10)    Past Medical History:  Diagnosis Date  . Arthritis   . Diabetes mellitus, type 2 (HCC)   . History of pneumonia   . Idiopathic cardiomyopathy (HCC)    Subsequent normalization of LVEF  . Obesity   . Syncope    Neurocardiogenic    Past Surgical History:  Procedure Laterality Date  . ABDOMINAL HYSTERECTOMY    . DILATION AND CURETTAGE OF UTERUS    . Left knee arthroscopic surgery    . MICRODISCECTOMY LUMBAR  11/10   Right L5-S1 / 2010 and 2014  . TOTAL KNEE ARTHROPLASTY Left 05/02/2015   Procedure: LEFT TOTAL  KNEE ARTHROPLASTY;  Surgeon: Samson Frederic, MD;  Location: [facility];  Service: Orthopedics;  Laterality: Left;  . TUBAL LIGATION      MEDICATIONS: No current facility-administered medications for this encounter.    . bisoprolol (ZEBETA) 5 MG tablet  . hydroxypropyl methylcellulose / hypromellose (ISOPTO TEARS / GONIOVISC) 2.5 %  ophthalmic solution  . losartan (COZAAR) 25 MG tablet  . metFORMIN (GLUCOPHAGE) 500 MG tablet  . pantoprazole (PROTONIX) 40 MG tablet  . pioglitazone (ACTOS) 15 MG tablet  . pravastatin (PRAVACHOL) 10 MG tablet  . spironolactone (ALDACTONE) 25 MG tablet  . CINNAMON PO  . ibuprofen (ADVIL,MOTRIN) 200 MG tablet    Velna Ochs [facility] Short Stay Center/Anesthesiology Phone [phone] 11/29/2018 1:30 PM

## 2018-11-29 NOTE — Progress Notes (Signed)
PT denies SOB and chest pain. Pt under the care of Dr. Diona Browner, Cardiology. Pt stated that a stress test was performed > 20 years ago and a cardiac cath was done " around 20 years. Pt denies having a chest x ray within the last year. Pt stated that she does not take Aspirin. Pt made aware to stop taking vitamins, fish oil, Cinnamon and herbal medications. Do not take any NSAIDs ie: Ibuprofen, Advil, Naproxen (Aleve), Motrin, BC and Goody Powder. Pt made aware to not take Actos and Metformin on DOS. Pt made aware to check BG every 2 hours prior to arrival to hospital on DOS. Pt made aware to treat a BG < 70 with 4  ounces of cranberry juice, wait 15 minutes after intervention to recheck BG, if BG remains < 70, call Short Stay unit to speak with a nurse. Marland Kitchen Pt verbalized understanding of all pre-op instructions. PA, Anesthesiology, made aware of pt history and clearance note; see note.

## 2018-11-30 ENCOUNTER — Ambulatory Visit (HOSPITAL_COMMUNITY): Payer: BLUE CROSS/BLUE SHIELD | Admitting: Vascular Surgery

## 2018-11-30 ENCOUNTER — Ambulatory Visit (HOSPITAL_COMMUNITY): Payer: BLUE CROSS/BLUE SHIELD

## 2018-11-30 ENCOUNTER — Encounter (HOSPITAL_COMMUNITY)
Admission: RE | Disposition: A | Discharge status: Home / Self Care | Payer: Self-pay | Source: Home / Self Care | Attending: Neurosurgery

## 2018-11-30 ENCOUNTER — Encounter (HOSPITAL_COMMUNITY): Payer: Self-pay | Admitting: Certified Registered"

## 2018-11-30 ENCOUNTER — Observation Stay (HOSPITAL_COMMUNITY)
Admission: RE | Admit: 2018-11-30 | Discharge: 2018-12-01 | Disposition: A | Discharge status: Home / Self Care | Payer: BLUE CROSS/BLUE SHIELD | Attending: Neurosurgery | Admitting: Neurosurgery

## 2018-11-30 DIAGNOSIS — Z981 Arthrodesis status: Secondary | ICD-10-CM | POA: Diagnosis not present

## 2018-11-30 DIAGNOSIS — R262 Difficulty in walking, not elsewhere classified: Secondary | ICD-10-CM | POA: Insufficient documentation

## 2018-11-30 DIAGNOSIS — Z79899 Other long term (current) drug therapy: Secondary | ICD-10-CM | POA: Diagnosis not present

## 2018-11-30 DIAGNOSIS — I10 Essential (primary) hypertension: Secondary | ICD-10-CM | POA: Diagnosis not present

## 2018-11-30 DIAGNOSIS — Z791 Long term (current) use of non-steroidal anti-inflammatories (NSAID): Secondary | ICD-10-CM | POA: Diagnosis not present

## 2018-11-30 DIAGNOSIS — M5126 Other intervertebral disc displacement, lumbar region: Secondary | ICD-10-CM | POA: Diagnosis present

## 2018-11-30 DIAGNOSIS — M5116 Intervertebral disc disorders with radiculopathy, lumbar region: Principal | ICD-10-CM | POA: Insufficient documentation

## 2018-11-30 DIAGNOSIS — M5117 Intervertebral disc disorders with radiculopathy, lumbosacral region: Secondary | ICD-10-CM | POA: Diagnosis not present

## 2018-11-30 DIAGNOSIS — M4316 Spondylolisthesis, lumbar region: Secondary | ICD-10-CM | POA: Insufficient documentation

## 2018-11-30 DIAGNOSIS — M4317 Spondylolisthesis, lumbosacral region: Secondary | ICD-10-CM | POA: Diagnosis not present

## 2018-11-30 DIAGNOSIS — M1990 Unspecified osteoarthritis, unspecified site: Secondary | ICD-10-CM | POA: Diagnosis not present

## 2018-11-30 DIAGNOSIS — Z419 Encounter for procedure for purposes other than remedying health state, unspecified: Secondary | ICD-10-CM

## 2018-11-30 DIAGNOSIS — Z7984 Long term (current) use of oral hypoglycemic drugs: Secondary | ICD-10-CM | POA: Insufficient documentation

## 2018-11-30 DIAGNOSIS — E119 Type 2 diabetes mellitus without complications: Secondary | ICD-10-CM | POA: Diagnosis not present

## 2018-11-30 HISTORY — DX: Other intervertebral disc displacement, lumbar region: M51.26

## 2018-11-30 HISTORY — DX: Type 2 diabetes mellitus without complications: E11.9

## 2018-11-30 HISTORY — PX: LUMBAR LAMINECTOMY/DECOMPRESSION MICRODISCECTOMY: SHX5026

## 2018-11-30 LAB — BASIC METABOLIC PANEL
Anion gap: 10 (ref 5–15)
BUN: 18 mg/dL (ref 8–23)
CO2: 27 mmol/L (ref 22–32)
Calcium: 9.4 mg/dL (ref 8.9–10.3)
Chloride: 103 mmol/L (ref 98–111)
Creatinine, Ser: 0.73 mg/dL (ref 0.44–1.00)
GFR calc Af Amer: >60 mL/min (ref >60)
Glucose, Bld: 168 mg/dL — ABNORMAL HIGH (ref 70–99)
Potassium: 4.5 mmol/L (ref 3.5–5.1)
SODIUM: 140 mmol/L (ref 135–145)

## 2018-11-30 LAB — GLUCOSE, CAPILLARY
Glucose-Capillary: 135 mg/dL — ABNORMAL HIGH (ref 70–99)
Glucose-Capillary: 192 mg/dL — ABNORMAL HIGH (ref 70–99)
Glucose-Capillary: 198 mg/dL — ABNORMAL HIGH (ref 70–99)
Glucose-Capillary: 223 mg/dL — ABNORMAL HIGH (ref 70–99)

## 2018-11-30 LAB — CBC
HCT: 39.9 % (ref 36.0–46.0)
Hemoglobin: 12.6 g/dL (ref 12.0–15.0)
MCH: 29.6 pg (ref 26.0–34.0)
MCHC: 31.6 g/dL (ref 30.0–36.0)
MCV: 93.7 fL (ref 80.0–100.0)
Platelets: 195 10*3/uL (ref 150–400)
RBC: 4.26 MIL/uL (ref 3.87–5.11)
RDW: 13.2 % (ref 11.5–15.5)
WBC: 9 10*3/uL (ref 4.0–10.5)
nRBC: 0 % (ref 0.0–0.2)

## 2018-11-30 SURGERY — LUMBAR LAMINECTOMY/DECOMPRESSION MICRODISCECTOMY 1 LEVEL
Anesthesia: General | Laterality: Right

## 2018-11-30 MED ORDER — FENTANYL CITRATE (PF) 100 MCG/2ML IJ SOLN
INTRAMUSCULAR | Status: AC
  Filled 2018-11-30: qty 2

## 2018-11-30 MED ORDER — ROCURONIUM BROMIDE 10 MG/ML (PF) SYRINGE
PREFILLED_SYRINGE | INTRAVENOUS | Status: DC | PRN
  Administered 2018-11-30: 30 mg via INTRAVENOUS

## 2018-11-30 MED ORDER — ALUM & MAG HYDROXIDE-SIMETH 200-200-20 MG/5ML PO SUSP: 30.0000 mL | Freq: Four times a day (QID) | ORAL | Status: DC | PRN

## 2018-11-30 MED ORDER — IBUPROFEN 200 MG PO TABS: 800.0000 mg | ORAL_TABLET | Freq: Two times a day (BID) | ORAL | Status: DC | PRN

## 2018-11-30 MED ORDER — METHOCARBAMOL 500 MG PO TABS
500.0000 mg | ORAL_TABLET | Freq: Four times a day (QID) | ORAL | Status: DC | PRN
  Administered 2018-11-30 – 2018-12-01 (×3): 500 mg via ORAL
  Filled 2018-11-30 (×3): qty 1

## 2018-11-30 MED ORDER — LIDOCAINE 2% (20 MG/ML) 5 ML SYRINGE
INTRAMUSCULAR | Status: DC | PRN
  Administered 2018-11-30: 60 mg via INTRAVENOUS

## 2018-11-30 MED ORDER — ROCURONIUM BROMIDE 50 MG/5ML IV SOSY
PREFILLED_SYRINGE | INTRAVENOUS | Status: AC
  Filled 2018-11-30: qty 5

## 2018-11-30 MED ORDER — METHOCARBAMOL 1000 MG/10ML IJ SOLN
500.0000 mg | Freq: Four times a day (QID) | INTRAVENOUS | Status: DC | PRN
  Filled 2018-11-30: qty 5

## 2018-11-30 MED ORDER — PANTOPRAZOLE SODIUM 40 MG PO TBEC
40.0000 mg | DELAYED_RELEASE_TABLET | Freq: Every day | ORAL | Status: DC
  Administered 2018-11-30 (×2): 40 mg via ORAL
  Filled 2018-11-30: qty 1

## 2018-11-30 MED ORDER — SPIRONOLACTONE 25 MG PO TABS
25.0000 mg | ORAL_TABLET | ORAL | Status: DC
  Filled 2018-11-30: qty 1

## 2018-11-30 MED ORDER — FENTANYL CITRATE (PF) 100 MCG/2ML IJ SOLN
25.0000 ug | INTRAMUSCULAR | Status: DC | PRN
  Administered 2018-11-30: 50 ug via INTRAVENOUS

## 2018-11-30 MED ORDER — BUPIVACAINE HCL (PF) 0.5 % IJ SOLN
INTRAMUSCULAR | Status: AC
  Filled 2018-11-30: qty 30

## 2018-11-30 MED ORDER — SUGAMMADEX SODIUM 200 MG/2ML IV SOLN
INTRAVENOUS | Status: DC | PRN
  Administered 2018-11-30: 20 mg via INTRAVENOUS

## 2018-11-30 MED ORDER — CHLORHEXIDINE GLUCONATE CLOTH 2 % EX PADS: 6.0000 | MEDICATED_PAD | Freq: Once | CUTANEOUS | Status: DC

## 2018-11-30 MED ORDER — KCL IN DEXTROSE-NACL 20-5-0.45 MEQ/L-%-% IV SOLN
INTRAVENOUS | Status: DC
  Administered 2018-11-30: 11:00:00 via INTRAVENOUS
  Filled 2018-11-30: qty 1000

## 2018-11-30 MED ORDER — PROPOFOL 10 MG/ML IV BOLUS
INTRAVENOUS | Status: DC | PRN
  Administered 2018-11-30: 140 mg via INTRAVENOUS

## 2018-11-30 MED ORDER — PHENOL 1.4 % MT LIQD: 1.0000 | OROMUCOSAL | Status: DC | PRN

## 2018-11-30 MED ORDER — OXYCODONE HCL 5 MG PO TABS
5.0000 mg | ORAL_TABLET | ORAL | Status: DC | PRN
  Administered 2018-11-30 (×2): 5 mg via ORAL
  Filled 2018-11-30 (×2): qty 1

## 2018-11-30 MED ORDER — LIDOCAINE 2% (20 MG/ML) 5 ML SYRINGE
INTRAMUSCULAR | Status: AC
  Filled 2018-11-30: qty 5

## 2018-11-30 MED ORDER — HYDROCODONE-ACETAMINOPHEN 7.5-325 MG PO TABS
ORAL_TABLET | ORAL | Status: AC
  Filled 2018-11-30: qty 1

## 2018-11-30 MED ORDER — POLYVINYL ALCOHOL 1.4 % OP SOLN
1.0000 [drp] | Freq: Every day | OPHTHALMIC | Status: DC
  Filled 2018-11-30: qty 15

## 2018-11-30 MED ORDER — SUFENTANIL CITRATE 50 MCG/ML IV SOLN
INTRAVENOUS | Status: DC | PRN
  Administered 2018-11-30: 20 ug via INTRAVENOUS
  Administered 2018-11-30 (×2): 10 ug via INTRAVENOUS
  Administered 2018-11-30 (×2): 5 ug via INTRAVENOUS

## 2018-11-30 MED ORDER — METHYLPREDNISOLONE ACETATE 80 MG/ML IJ SUSP
INTRAMUSCULAR | Status: AC
  Filled 2018-11-30: qty 1

## 2018-11-30 MED ORDER — PANTOPRAZOLE SODIUM 40 MG IV SOLR: 40.0000 mg | Freq: Every day | INTRAVENOUS | Status: DC

## 2018-11-30 MED ORDER — PROPOFOL 10 MG/ML IV BOLUS
INTRAVENOUS | Status: AC
  Filled 2018-11-30: qty 20

## 2018-11-30 MED ORDER — MORPHINE SULFATE (PF) 2 MG/ML IV SOLN: 2.0000 mg | INTRAVENOUS | Status: DC | PRN

## 2018-11-30 MED ORDER — SUFENTANIL CITRATE 50 MCG/ML IV SOLN
INTRAVENOUS | Status: AC
  Filled 2018-11-30: qty 1

## 2018-11-30 MED ORDER — HYPROMELLOSE (GONIOSCOPIC) 2.5 % OP SOLN: 1.0000 [drp] | Freq: Every day | OPHTHALMIC | Status: DC

## 2018-11-30 MED ORDER — ONDANSETRON HCL 4 MG/2ML IJ SOLN
INTRAMUSCULAR | Status: AC
  Filled 2018-11-30: qty 2

## 2018-11-30 MED ORDER — SODIUM CHLORIDE 0.9% FLUSH
3.0000 mL | Freq: Two times a day (BID) | INTRAVENOUS | Status: DC
  Administered 2018-11-30: 3 mL via INTRAVENOUS

## 2018-11-30 MED ORDER — BISACODYL 10 MG RE SUPP: 10.0000 mg | Freq: Every day | RECTAL | Status: DC | PRN

## 2018-11-30 MED ORDER — SODIUM CHLORIDE 0.9% FLUSH: 3.0000 mL | INTRAVENOUS | Status: DC | PRN

## 2018-11-30 MED ORDER — METFORMIN HCL 500 MG PO TABS: 1000.0000 mg | ORAL_TABLET | Freq: Every day | ORAL | Status: DC

## 2018-11-30 MED ORDER — HYDROCODONE-ACETAMINOPHEN 7.5-325 MG PO TABS
2.0000 | ORAL_TABLET | ORAL | Status: DC | PRN
  Administered 2018-11-30: 2 via ORAL
  Administered 2018-11-30: 1 via ORAL
  Administered 2018-11-30 – 2018-12-01 (×4): 2 via ORAL
  Filled 2018-11-30 (×4): qty 2

## 2018-11-30 MED ORDER — ZOLPIDEM TARTRATE 5 MG PO TABS: 5.0000 mg | ORAL_TABLET | Freq: Every evening | ORAL | Status: DC | PRN

## 2018-11-30 MED ORDER — INSULIN ASPART 100 UNIT/ML ~~LOC~~ SOLN
0.0000 [IU] | Freq: Every day | SUBCUTANEOUS | Status: DC
  Administered 2018-11-30: 2 [IU] via SUBCUTANEOUS

## 2018-11-30 MED ORDER — LIDOCAINE-EPINEPHRINE 1 %-1:100000 IJ SOLN
INTRAMUSCULAR | Status: AC
  Filled 2018-11-30: qty 1

## 2018-11-30 MED ORDER — METFORMIN HCL 500 MG PO TABS
500.0000 mg | ORAL_TABLET | Freq: Every day | ORAL | Status: DC
  Administered 2018-11-30: 500 mg via ORAL
  Filled 2018-11-30: qty 1

## 2018-11-30 MED ORDER — MIDAZOLAM HCL 5 MG/5ML IJ SOLN
INTRAMUSCULAR | Status: DC | PRN
  Administered 2018-11-30: 2 mg via INTRAVENOUS

## 2018-11-30 MED ORDER — LIDOCAINE-EPINEPHRINE 1 %-1:100000 IJ SOLN
INTRAMUSCULAR | Status: DC | PRN
  Administered 2018-11-30: 10 mL

## 2018-11-30 MED ORDER — LACTATED RINGERS IV SOLN
INTRAVENOUS | Status: DC | PRN
  Administered 2018-11-30: 07:00:00 via INTRAVENOUS

## 2018-11-30 MED ORDER — MENTHOL 3 MG MT LOZG: 1.0000 | LOZENGE | OROMUCOSAL | Status: DC | PRN

## 2018-11-30 MED ORDER — SODIUM CHLORIDE 0.9 % IV SOLN: 250.0000 mL | INTRAVENOUS | Status: DC

## 2018-11-30 MED ORDER — ONDANSETRON HCL 4 MG PO TABS: 4.0000 mg | ORAL_TABLET | Freq: Four times a day (QID) | ORAL | Status: DC | PRN

## 2018-11-30 MED ORDER — INSULIN ASPART 100 UNIT/ML ~~LOC~~ SOLN
0.0000 [IU] | Freq: Three times a day (TID) | SUBCUTANEOUS | Status: DC
  Administered 2018-11-30 (×2): 4 [IU] via SUBCUTANEOUS

## 2018-11-30 MED ORDER — 0.9 % SODIUM CHLORIDE (POUR BTL) OPTIME
TOPICAL | Status: DC | PRN
  Administered 2018-11-30: 1000 mL

## 2018-11-30 MED ORDER — DEXAMETHASONE SODIUM PHOSPHATE 10 MG/ML IJ SOLN
INTRAMUSCULAR | Status: DC | PRN
  Administered 2018-11-30: 4 mg via INTRAVENOUS

## 2018-11-30 MED ORDER — SPIRONOLACTONE 25 MG PO TABS: 25.0000 mg | ORAL_TABLET | ORAL | Status: DC

## 2018-11-30 MED ORDER — POLYETHYLENE GLYCOL 3350 17 G PO PACK
17.0000 g | PACK | Freq: Every day | ORAL | Status: DC | PRN
  Administered 2018-11-30: 17 g via ORAL
  Filled 2018-11-30: qty 1

## 2018-11-30 MED ORDER — MIDAZOLAM HCL 2 MG/2ML IJ SOLN
INTRAMUSCULAR | Status: AC
  Filled 2018-11-30: qty 2

## 2018-11-30 MED ORDER — PIOGLITAZONE HCL 15 MG PO TABS
15.0000 mg | ORAL_TABLET | Freq: Every day | ORAL | Status: DC
  Administered 2018-11-30: 15 mg via ORAL
  Filled 2018-11-30 (×3): qty 1

## 2018-11-30 MED ORDER — PANTOPRAZOLE SODIUM 40 MG PO TBEC: 40.0000 mg | DELAYED_RELEASE_TABLET | Freq: Every evening | ORAL | Status: DC

## 2018-11-30 MED ORDER — FENTANYL CITRATE (PF) 100 MCG/2ML IJ SOLN
INTRAMUSCULAR | Status: DC | PRN
  Administered 2018-11-30: 100 ug via INTRAVENOUS

## 2018-11-30 MED ORDER — FLEET ENEMA 7-19 GM/118ML RE ENEM: 1.0000 | ENEMA | Freq: Once | RECTAL | Status: DC | PRN

## 2018-11-30 MED ORDER — SUCCINYLCHOLINE CHLORIDE 200 MG/10ML IV SOSY
PREFILLED_SYRINGE | INTRAVENOUS | Status: AC
  Filled 2018-11-30: qty 10

## 2018-11-30 MED ORDER — BUPIVACAINE HCL (PF) 0.5 % IJ SOLN
INTRAMUSCULAR | Status: DC | PRN
  Administered 2018-11-30: 10 mL

## 2018-11-30 MED ORDER — BISOPROLOL FUMARATE 5 MG PO TABS
5.0000 mg | ORAL_TABLET | Freq: Every day | ORAL | Status: DC
  Filled 2018-11-30: qty 1

## 2018-11-30 MED ORDER — PRAVASTATIN SODIUM 10 MG PO TABS: 10.0000 mg | ORAL_TABLET | Freq: Every day | ORAL | Status: DC

## 2018-11-30 MED ORDER — THROMBIN 5000 UNITS EX SOLR
CUTANEOUS | Status: AC
  Filled 2018-11-30: qty 5000

## 2018-11-30 MED ORDER — ACETAMINOPHEN 325 MG PO TABS: 650.0000 mg | ORAL_TABLET | ORAL | Status: DC | PRN

## 2018-11-30 MED ORDER — METFORMIN HCL 500 MG PO TABS: 500.0000 mg | ORAL_TABLET | ORAL | Status: DC

## 2018-11-30 MED ORDER — ONDANSETRON HCL 4 MG/2ML IJ SOLN
INTRAMUSCULAR | Status: DC | PRN
  Administered 2018-11-30: 4 mg via INTRAVENOUS

## 2018-11-30 MED ORDER — SODIUM CHLORIDE (PF) 0.9 % IJ SOLN
INTRAMUSCULAR | Status: AC
  Filled 2018-11-30: qty 10

## 2018-11-30 MED ORDER — CEFAZOLIN SODIUM-DEXTROSE 2-4 GM/100ML-% IV SOLN
2.0000 g | Freq: Three times a day (TID) | INTRAVENOUS | Status: AC
  Administered 2018-11-30 (×2): 2 g via INTRAVENOUS
  Filled 2018-11-30 (×2): qty 100

## 2018-11-30 MED ORDER — DEXAMETHASONE SODIUM PHOSPHATE 10 MG/ML IJ SOLN
INTRAMUSCULAR | Status: AC
  Filled 2018-11-30: qty 1

## 2018-11-30 MED ORDER — ONDANSETRON HCL 4 MG/2ML IJ SOLN: 4.0000 mg | Freq: Four times a day (QID) | INTRAMUSCULAR | Status: DC | PRN

## 2018-11-30 MED ORDER — SUCCINYLCHOLINE CHLORIDE 20 MG/ML IJ SOLN
INTRAMUSCULAR | Status: DC | PRN
  Administered 2018-11-30: 130 mg via INTRAVENOUS

## 2018-11-30 MED ORDER — LOSARTAN POTASSIUM 50 MG PO TABS
25.0000 mg | ORAL_TABLET | Freq: Every day | ORAL | Status: DC
  Administered 2018-11-30: 25 mg via ORAL
  Filled 2018-11-30: qty 1

## 2018-11-30 MED ORDER — METHYLPREDNISOLONE ACETATE 80 MG/ML IJ SUSP
INTRAMUSCULAR | Status: DC | PRN
  Administered 2018-11-30: 80 mg

## 2018-11-30 MED ORDER — DOCUSATE SODIUM 100 MG PO CAPS
100.0000 mg | ORAL_CAPSULE | Freq: Two times a day (BID) | ORAL | Status: DC
  Administered 2018-11-30 (×3): 100 mg via ORAL
  Filled 2018-11-30 (×2): qty 1

## 2018-11-30 MED ORDER — CINNAMON 500 MG PO CAPS: ORAL_CAPSULE | Freq: Every day | ORAL | Status: DC

## 2018-11-30 MED ORDER — THROMBIN 5000 UNITS EX SOLR
OROMUCOSAL | Status: DC | PRN
  Administered 2018-11-30: 07:00:00 via TOPICAL

## 2018-11-30 MED ORDER — ACETAMINOPHEN 650 MG RE SUPP: 650.0000 mg | RECTAL | Status: DC | PRN

## 2018-11-30 SURGICAL SUPPLY — 52 items
BLADE CLIPPER SURG (BLADE) IMPLANT
BUR MATCHSTICK NEURO 3.0 LAGG (BURR) ×3 IMPLANT
BUR ROUND FLUTED 5 RND (BURR) ×2 IMPLANT
BUR ROUND FLUTED 5MM RND (BURR) ×1
CANISTER SUCT 3000ML PPV (MISCELLANEOUS) ×3 IMPLANT
CARTRIDGE OIL MAESTRO DRILL (MISCELLANEOUS) ×1 IMPLANT
COVER WAND RF STERILE (DRAPES) ×3 IMPLANT
DECANTER SPIKE VIAL GLASS SM (MISCELLANEOUS) ×3 IMPLANT
DERMABOND ADVANCED (GAUZE/BANDAGES/DRESSINGS) ×2
DERMABOND ADVANCED .7 DNX12 (GAUZE/BANDAGES/DRESSINGS) ×1 IMPLANT
DIFFUSER DRILL AIR PNEUMATIC (MISCELLANEOUS) ×3 IMPLANT
DRAPE LAPAROTOMY 100X72X124 (DRAPES) ×3 IMPLANT
DRAPE MICROSCOPE LEICA (MISCELLANEOUS) ×3 IMPLANT
DRAPE SURG 17X23 STRL (DRAPES) ×3 IMPLANT
DRSG OPSITE POSTOP 3X4 (GAUZE/BANDAGES/DRESSINGS) ×3 IMPLANT
DURAPREP 26ML APPLICATOR (WOUND CARE) ×3 IMPLANT
ELECT REM PT RETURN 9FT ADLT (ELECTROSURGICAL) ×3
ELECTRODE REM PT RTRN 9FT ADLT (ELECTROSURGICAL) ×1 IMPLANT
GAUZE 4X4 16PLY RFD (DISPOSABLE) IMPLANT
GAUZE SPONGE 4X4 12PLY STRL (GAUZE/BANDAGES/DRESSINGS) IMPLANT
GLOVE BIO SURGEON STRL SZ8 (GLOVE) ×3 IMPLANT
GLOVE BIOGEL PI IND STRL 8 (GLOVE) ×1 IMPLANT
GLOVE BIOGEL PI IND STRL 8.5 (GLOVE) ×1 IMPLANT
GLOVE BIOGEL PI INDICATOR 8 (GLOVE) ×2
GLOVE BIOGEL PI INDICATOR 8.5 (GLOVE) ×2
GLOVE ECLIPSE 8.0 STRL XLNG CF (GLOVE) ×3 IMPLANT
GLOVE EXAM NITRILE XL STR (GLOVE) IMPLANT
GOWN STRL REUS W/ TWL LRG LVL3 (GOWN DISPOSABLE) ×1 IMPLANT
GOWN STRL REUS W/ TWL XL LVL3 (GOWN DISPOSABLE) ×1 IMPLANT
GOWN STRL REUS W/TWL 2XL LVL3 (GOWN DISPOSABLE) ×3 IMPLANT
GOWN STRL REUS W/TWL LRG LVL3 (GOWN DISPOSABLE) ×2
GOWN STRL REUS W/TWL XL LVL3 (GOWN DISPOSABLE) ×2
HEMOSTAT POWDER KIT SURGIFOAM (HEMOSTASIS) ×3 IMPLANT
KIT BASIN OR (CUSTOM PROCEDURE TRAY) ×3 IMPLANT
KIT TURNOVER KIT B (KITS) ×3 IMPLANT
NEEDLE HYPO 18GX1.5 BLUNT FILL (NEEDLE) ×3 IMPLANT
NEEDLE HYPO 25X1 1.5 SAFETY (NEEDLE) ×3 IMPLANT
NEEDLE SPNL 18GX3.5 QUINCKE PK (NEEDLE) ×3 IMPLANT
NS IRRIG 1000ML POUR BTL (IV SOLUTION) ×3 IMPLANT
OIL CARTRIDGE MAESTRO DRILL (MISCELLANEOUS) ×3
PACK LAMINECTOMY NEURO (CUSTOM PROCEDURE TRAY) ×3 IMPLANT
PAD ARMBOARD 7.5X6 YLW CONV (MISCELLANEOUS) ×9 IMPLANT
RUBBERBAND STERILE (MISCELLANEOUS) ×6 IMPLANT
SPONGE SURGIFOAM ABS GEL SZ50 (HEMOSTASIS) IMPLANT
SUT VIC AB 0 CT1 18XCR BRD8 (SUTURE) ×1 IMPLANT
SUT VIC AB 0 CT1 8-18 (SUTURE) ×3
SUT VIC AB 2-0 CT1 18 (SUTURE) ×3 IMPLANT
SUT VIC AB 3-0 SH 8-18 (SUTURE) ×6 IMPLANT
SYR 5ML LL (SYRINGE) IMPLANT
TOWEL GREEN STERILE (TOWEL DISPOSABLE) ×3 IMPLANT
TOWEL GREEN STERILE FF (TOWEL DISPOSABLE) ×3 IMPLANT
WATER STERILE IRR 1000ML POUR (IV SOLUTION) ×3 IMPLANT

## 2018-11-30 NOTE — Brief Op Note (Signed)
11/30/2018  9:08 AM  PATIENT:  Dawn Weiss  62 y.o. female  PRE-OPERATIVE DIAGNOSIS:  Spondylolisthesis, Lumbar region, herniated lumbar disc, lumbar radiculopathy, lumbago L 5 S 1 level  POST-OPERATIVE DIAGNOSIS: Spondylolisthesis, Lumbar region, herniated lumbar disc, lumbar radiculopathy, lumbago L 5 S 1 level  PROCEDURE:  Procedure(s): Redo Right Lumbar five Sacral one Microdiscectomy (Right) with microdissection  SURGEON:  Surgeon(s) and Role:    Maeola Harman, MD - Primary  PHYSICIAN ASSISTANT:   ASSISTANTS: P[place]at, RN   ANESTHESIA:   general  EBL:  Minimal  BLOOD ADMINISTERED:none  DRAINS: none   LOCAL MEDICATIONS USED:  MARCAINE    and LIDOCAINE   SPECIMEN:  No Specimen  DISPOSITION OF SPECIMEN:  N/A  COUNTS:  YES  TOURNIQUET:  * No tourniquets in log *  DICTATION: Patient has a recurrent right L 5 S 1 disc rupture on the right with significant right leg pain and weakness. It was elected to take her to surgery for redo right L 5 S 1  microdiscectomy.  Procedure: Patient was brought to the operating room and following the smooth and uncomplicated induction of general endotracheal anesthesia he was placed in a prone position on the Wilson frame. Low back was prepped and draped in the usual sterile fashion with betadine scrub and DuraPrep.Area of planned incision was infiltrated with local lidocaine. Incision was made in the midline and carried to the lumbodorsal fascia which was incised on the right side of midline. Subperiosteal dissection was performed exposing what was felt to be L 5 S 1 level. Intraoperative x-ray demonstrated marker probe at L 5  S 1.  A redo hemi-semi-laminectomy of L 5 was performed a high-speed drill and completed with Kerrison rongeurs and a generous foraminotomy was performed overlying the superior aspect of the S 1 lamina. Ligamentum flavum was detached and removed in a piecemeal fashion and the S 1 nerve root was decompressed laterally  with removal of the superior aspect of the facet and ligamentum causing nerve root compression. The microscope was brought into the field and the S 1 nerve root was mobilized medially. This exposed soft disc material and a superiorly migrated free fragment of herniated disc material, which was under the takeoff of the L 5 nerve root. Multiple fragments were removed and these extended into the interspace which appeared to be quite soft with a disrupted annulus overlying the interspace. As a result it was elected to further decompress the interspace and remove loose disc material and this was done with a variety of pituitary rongeurs. The redundant annulus was also removed with 2 mm Kerrison rongeur.  At this point it was felt that all neural elements were well decompressed and there was no evidence of residual loose disc material within the interspace. The interspace was then irrigated with saline and no additional disc material was mobilized. Hemostasis was assured with bipolar electrocautery and the interspace was irrigated with Depo-Medrol and fentanyl. The lumbodorsal fascia was closed with 0 Vicryl sutures the subcutaneous tissues reapproximated 2-0 Vicryl inverted sutures and the skin edges were reapproximated with 3-0 Vicryl subcuticular stitch. The wound is dressed with Dermabond and an occlusive dressing. Patient was extubated in the operating room and taken to recovery in stable and satisfactory condition having tolerated his operation well counts were correct at the end of the case.   PLAN OF CARE: Admit to inpatient   PATIENT DISPOSITION:  PACU - hemodynamically stable.   Delay start of Pharmacological VTE agent (>24hrs)  due to surgical blood loss or risk of bleeding: yes

## 2018-11-30 NOTE — Progress Notes (Signed)
PHARMACIST - PHYSICIAN ORDER COMMUNICATION  CONCERNING: P&T Medication Policy on Herbal Medications  DESCRIPTION:  This patient's order for:  Cinnamon  has been noted.  This product(s) is classified as an "herbal" or natural product. Due to a lack of definitive safety studies or FDA approval, nonstandard manufacturing practices, plus the potential risk of unknown drug-drug interactions while on inpatient medications, the Pharmacy and Therapeutics Committee does not permit the use of "herbal" or natural products of this type within [facility].   ACTION TAKEN: The pharmacy department is unable to verify this order at this time and your patient has been informed of this safety policy. Please reevaluate patient's clinical condition at discharge and address if the herbal or natural product(s) should be resumed at that time.  

## 2018-11-30 NOTE — Evaluation (Signed)
Physical Therapy One-time Evaluation Patient Details Name: Dawn Weiss MRN: [id number] DOB: 22-Jun-1956 Today's Date: 11/30/2018   History of Present Illness  Patient with history of DM, cardiomyopathy, obesity and arthritis admitted with L5-S1 spondylolisthesis, and HNP with radiculopathy now s/p Redo Right Lumbar five Sacral one Microdiscectomy  Clinical Impression  Patient presents with decreased mobility due to pain and new back precautions.  She demonstrated safe technique with cues and supervision.  Assist for ambulation only due to pt reporting feeling lightheaded with medication.  Feel she should be safe for d/c home with spouse assist without initial follow up PT.  Did review information about work station set up as patient reports sits for work.  May need consultation for proper posture during work hours once returns to work.  No further skilled acute PT needs.  Can walk with nursing and spouse assist.     Follow Up Recommendations No PT follow up    Equipment Recommendations  None recommended by PT    Recommendations for Other Services       Precautions / Restrictions Precautions Precautions: Fall;Back Precaution Booklet Issued: Yes (comment)      Mobility  Bed Mobility Overal bed mobility: Needs Assistance Bed Mobility: Sidelying to Sit;Sit to Sidelying   Sidelying to sit: Supervision;HOB elevated     Sit to sidelying: Min assist General bed mobility comments: cues for technique, assist to side for leg  Transfers Overall transfer level: Needs assistance Equipment used: 1 person hand held assist Transfers: Sit to/from Stand Sit to Stand: Min guard         General transfer comment: assist due to IV placement   Ambulation/Gait Ambulation/Gait assistance: Min assist Gait Distance (Feet): 300 Feet Assistive device: 1 person hand held assist Gait Pattern/deviations: Step-through pattern;Decreased stride length     General Gait Details: HHA due to  feeling woozy from pain medication  Stairs            Wheelchair Mobility    Modified Rankin (Stroke Patients Only)       Balance Overall balance assessment: Mild deficits observed, not formally tested                                           Pertinent Vitals/Pain Pain Assessment: Faces Faces Pain Scale: Hurts little more Pain Location: incisional with movement Pain Descriptors / Indicators: Operative site guarding Pain Intervention(s): Monitored during session;Repositioned;Patient requesting pain meds-RN notified;RN gave pain meds during session    Home Living Family/patient expects to be discharged to:: Private residence Living Arrangements: Spouse/significant other Available Help at Discharge: Family;Available 24 hours/day Type of Home: House Home Access: Stairs to enter   [employer] of Steps: 2 Home Layout: One level Home Equipment: Walker - 4 wheels      Prior Function Level of Independence: Independent               Hand Dominance        Extremity/Trunk Assessment   Upper Extremity Assessment Upper Extremity Assessment: Overall WFL for tasks assessed    Lower Extremity Assessment Lower Extremity Assessment: Overall WFL for tasks assessed       Communication   Communication: No difficulties  Cognition Arousal/Alertness: Awake/alert Behavior During Therapy: WFL for tasks assessed/performed Overall Cognitive Status: Within Functional Limits for tasks assessed  General Comments General comments (skin integrity, edema, etc.): Educated in back precautions with handout issued and spouse present as well, discussed plan for car transfers as well    Exercises     Assessment/Plan    PT Assessment Patent does not need any further PT services  PT Problem List         PT Treatment Interventions      PT Goals (Current goals can be found in the Care Plan  section)  Acute Rehab PT Goals PT Goal Formulation: All assessment and education complete, DC therapy    Frequency     Barriers to discharge        Co-evaluation               AM-PAC PT "6 Clicks" Mobility  Outcome Measure Help needed turning from your back to your side while in a flat bed without using bedrails?: A Little Help needed moving from lying on your back to sitting on the side of a flat bed without using bedrails?: A Little Help needed moving to and from a bed to a chair (including a wheelchair)?: A Little Help needed standing up from a chair using your arms (e.g., wheelchair or bedside chair)?: A Little Help needed to walk in hospital room?: A Little Help needed climbing 3-5 steps with a railing? : A Little 6 Click Score: 18    End of Session Equipment Utilized During Treatment: Gait belt Activity Tolerance: Patient tolerated treatment well Patient left: in bed;with call bell/phone within reach;with family/visitor present   PT Visit Diagnosis: Difficulty in walking, not elsewhere classified (R26.2)    Time: 2542-7062 PT Time Calculation (min) (ACUTE ONLY): 23 min   Charges:   PT Evaluation $PT Eval Low Complexity: 1 Low PT Treatments $Gait Training: 8-22 mins        Sheran Lawless, PT Acute Rehabilitation Services [phone] 11/30/2018   Elray Mcgregor 11/30/2018, 4:48 PM

## 2018-11-30 NOTE — H&P (Signed)
Patient ID:   [id number] Patient: Dawn Weiss  Date of Birth: [birth year] Visit Type: Office Visit   Date: 11/15/2018 10:15 AM Provider: Danae Orleans. Venetia Maxon MD   This 62 year old female presents for Follow Up of back and R leg and thumb.  HISTORY OF PRESENT ILLNESS:  1.  Follow Up of back, R leg and thumb  Patient presents as scheduled to review her MRI. She notes unchanged severe low back pain that radiates into her right foot. She also notes unchanged right hand numbness and unchanged right leg numbness and tingling.         Medical/Surgical/Interim History Reviewed, no change.  Last detailed document date:11/08/2018.     Family History:  Reviewed, no changes.  Last detailed document date:11/08/2018.   Social History: Reviewed, no changes. Last detailed document date: 11/08/2018.    MEDICATIONS: (added, continued or stopped this visit) Started Medication Directions Instruction Stopped   atenolol 25 mg tablet take 1 tablet by oral route  every day     bisoprolol fumarate 5 mg tablet take 1 tablet by oral route  every day    11/08/2016 hydrocodone 5 mg-acetaminophen 325 mg tablet take 1 tablet by oral route  every 6 hours as needed for pain     ibuprofen 200 mg tablet take 4 tablet by oral route  every 8 hours as needed with food     losartan 25 mg tablet take 1 tablet by oral route 3 times every day     metformin 500 mg tablet take 1 tablet by oral route 2 times every day with morning and evening meals     pantoprazole 40 mg tablet,delayed release take 1 tablet by oral route  every day     pioglitazone 15 mg tablet take 1 tablet by oral route  every day     pravastatin 10 mg tablet take 1 tablet by oral route  every day     spironolactone 25 mg tablet take 1 tablet by oral route  every day    08/05/2014 tizanidine 2 mg tablet take 1-2 tabs TID prn spasm       ALLERGIES: Ingredient Reaction Medication Name Comment  NO KNOWN ALLERGIES     No known  allergies.    PHYSICAL EXAM:   Vitals Date Temp F BP Pulse Ht In Wt Lb BMI BSA Pain Score  11/15/2018  133/77 84 64 279 47.89  10/10      IMPRESSION:   L-spine MRI with and without contrast reveals disc rupture at L5-S1 with right lateral recess narrowing and right-sided nerve impingement. Discussed options with patient - injections or surgical intervention - patient preferred surgical intervention - recommended redo right L5-S1 microdiscectomy.  PLAN:  1. Nurse education given 2. Redo right L5-S1 microdiscectomy scheduled 3. Follow-up after surgery  Orders: Instruction(s)/Education: Assessment Instruction  R03.0 Hypertension education  [phone] Lifestyle education regarding diet   Completed Orders (this encounter) Order Details Reason Side Interpretation Result Initial Treatment Date Region  Hypertension education Continue to monitor blood pressure, if blood pressure remains elevated contact primary doctor        Lifestyle education regarding diet Patient encourage to eat a well balance diet         Assessment/Plan   # Detail Type Description   1. Assessment Elevated blood-pressure reading, w/o diagnosis of htn (R03.0).       2. Assessment Body mass index (BMI) 45.0-49.9, adult (Z68.42).   Plan Orders Today's instructions / counseling include(s) Lifestyle education  regarding diet. Clinical information/comments: Patient encourage to eat a well balance diet.         Pain Management Plan Pain Scale: 10/10. Method: Numeric Pain Intensity Scale. Location: back and R leg. Onset: 07/22/2014. Duration: varies. Quality: discomforting. Pain management follow-up plan of care: Patient taken medication as prescribed.  Fall Risk Plan The patient has not fallen in the last year.              Provider:  Danae Orleans. Venetia Maxon MD  11/15/2018 10:49 AM Dictation edited by: Briscoe Burns    CC Providers: Wyvonnia Lora [address] [place],  [place]  [address]   Maeola Harman MD  [address] [place], [place] [address]               Electronically signed by Danae Orleans. Venetia Maxon MD on 11/18/2018 02:27 PM

## 2018-11-30 NOTE — Transfer of Care (Signed)
Immediate Anesthesia Transfer of Care Note  Patient: [name]  Procedure(s) Performed: Redo Right Lumbar five Sacral one Microdiscectomy (Right )  Patient Location: PACU  Anesthesia Type:General  Level of Consciousness: awake and patient cooperative  Airway & Oxygen Therapy: Patient Spontanous Breathing and Patient connected to nasal cannula oxygen  Post-op Assessment: Report given to RN, Post -op Vital signs reviewed and stable and Patient moving all extremities  Post vital signs: Reviewed and stable  Last Vitals:  Vitals Value Taken Time  BP    Temp 36.7 C 11/30/2018  9:13 AM  Pulse 75 11/30/2018  9:14 AM  Resp 16 11/30/2018  9:14 AM  SpO2 98 % 11/30/2018  9:14 AM  Vitals shown include unvalidated device data.  Last Pain:  Vitals:   11/30/18 0637  TempSrc:   PainSc: 9       Patients Stated Pain Goal: 2 (11/30/18 2585)  Complications: No apparent anesthesia complications

## 2018-11-30 NOTE — Op Note (Signed)
11/30/2018  9:08 AM  PATIENT:  Dawn Weiss  62 y.o. female  PRE-OPERATIVE DIAGNOSIS:  Spondylolisthesis, Lumbar region, herniated lumbar disc, lumbar radiculopathy, lumbago L 5 S 1 level  POST-OPERATIVE DIAGNOSIS: Spondylolisthesis, Lumbar region, herniated lumbar disc, lumbar radiculopathy, lumbago L 5 S 1 level  PROCEDURE:  Procedure(s): Redo Right Lumbar five Sacral one Microdiscectomy (Right) with microdissection  SURGEON:  Surgeon(s) and Role:    * Quyen Cutsforth, MD - Primary  PHYSICIAN ASSISTANT:   ASSISTANTS: Poteat, RN   ANESTHESIA:   general  EBL:  Minimal  BLOOD ADMINISTERED:none  DRAINS: none   LOCAL MEDICATIONS USED:  MARCAINE    and LIDOCAINE   SPECIMEN:  No Specimen  DISPOSITION OF SPECIMEN:  N/A  COUNTS:  YES  TOURNIQUET:  * No tourniquets in log *  DICTATION: Patient has a recurrent right L 5 S 1 disc rupture on the right with significant right leg pain and weakness. It was elected to take her to surgery for redo right L 5 S 1  microdiscectomy.  Procedure: Patient was brought to the operating room and following the smooth and uncomplicated induction of general endotracheal anesthesia he was placed in a prone position on the Wilson frame. Low back was prepped and draped in the usual sterile fashion with betadine scrub and DuraPrep.Area of planned incision was infiltrated with local lidocaine. Incision was made in the midline and carried to the lumbodorsal fascia which was incised on the right side of midline. Subperiosteal dissection was performed exposing what was felt to be L 5 S 1 level. Intraoperative x-ray demonstrated marker probe at L 5  S 1.  A redo hemi-semi-laminectomy of L 5 was performed a high-speed drill and completed with Kerrison rongeurs and a generous foraminotomy was performed overlying the superior aspect of the S 1 lamina. Ligamentum flavum was detached and removed in a piecemeal fashion and the S 1 nerve root was decompressed laterally  with removal of the superior aspect of the facet and ligamentum causing nerve root compression. The microscope was brought into the field and the S 1 nerve root was mobilized medially. This exposed soft disc material and a superiorly migrated free fragment of herniated disc material, which was under the takeoff of the L 5 nerve root. Multiple fragments were removed and these extended into the interspace which appeared to be quite soft with a disrupted annulus overlying the interspace. As a result it was elected to further decompress the interspace and remove loose disc material and this was done with a variety of pituitary rongeurs. The redundant annulus was also removed with 2 mm Kerrison rongeur.  At this point it was felt that all neural elements were well decompressed and there was no evidence of residual loose disc material within the interspace. The interspace was then irrigated with saline and no additional disc material was mobilized. Hemostasis was assured with bipolar electrocautery and the interspace was irrigated with Depo-Medrol and fentanyl. The lumbodorsal fascia was closed with 0 Vicryl sutures the subcutaneous tissues reapproximated 2-0 Vicryl inverted sutures and the skin edges were reapproximated with 3-0 Vicryl subcuticular stitch. The wound is dressed with Dermabond and an occlusive dressing. Patient was extubated in the operating room and taken to recovery in stable and satisfactory condition having tolerated his operation well counts were correct at the end of the case.   PLAN OF CARE: Admit to inpatient   PATIENT DISPOSITION:  PACU - hemodynamically stable.   Delay start of Pharmacological VTE agent (>24hrs)   due to surgical blood loss or risk of bleeding: yes

## 2018-11-30 NOTE — Anesthesia Procedure Notes (Signed)
Procedure Name: Intubation Date/Time: 11/30/2018 7:36 AM Performed by: Moshe Salisbury, CRNA Pre-anesthesia Checklist: Patient identified, Emergency Drugs available, Suction available and Patient being monitored Patient Re-evaluated:Patient Re-evaluated prior to induction Oxygen Delivery Method: Circle System Utilized Preoxygenation: Pre-oxygenation with 100% oxygen Induction Type: IV induction and Rapid sequence Ventilation: Mask ventilation without difficulty Laryngoscope Size: Mac and 3 Grade View: Grade II Tube type: Oral Tube size: 7.5 mm Number of attempts: 1 Airway Equipment and Method: Stylet Placement Confirmation: ETT inserted through vocal cords under direct vision,  positive ETCO2 and breath sounds checked- equal and bilateral Secured at: 20 cm Tube secured with: Tape Dental Injury: Teeth and Oropharynx as per pre-operative assessment

## 2018-11-30 NOTE — Anesthesia Postprocedure Evaluation (Signed)
Anesthesia Post Note  Patient: Dawn Weiss  Procedure(s) Performed: Redo Right Lumbar five Sacral one Microdiscectomy (Right )     Patient location during evaluation: PACU Anesthesia Type: General Level of consciousness: awake Pain management: pain level controlled Respiratory status: spontaneous breathing Cardiovascular status: stable Anesthetic complications: no    Last Vitals:  Vitals:   11/30/18 1034 11/30/18 1311  BP: (!) 131/59 133/85  Pulse: 71 76  Resp: 18 (!) 8  Temp: 36.5 C 36.5 C  SpO2: 99% 95%    Last Pain:  Vitals:   11/30/18 1311  TempSrc: Oral  PainSc:                  [name]

## 2018-11-30 NOTE — Progress Notes (Signed)
Awake, alert, conversant.  MSAEW with full strength.  Improved right leg pain.  Patient is doing well.

## 2018-11-30 NOTE — Interval H&P Note (Signed)
History and Physical Interval Note:  11/30/2018 7:37 AM  Dawn Weiss  has presented today for surgery, with the diagnosis of Spondylolisthesis, Lumbar region  The various methods of treatment have been discussed with the patient and family. After consideration of risks, benefits and other options for treatment, the patient has consented to  Procedure(s) with comments: Redo Right Lumbar 5 Sacral 1 Microdiscectomy (Right) - Redo Right Lumbar 5 Sacral 1 Microdiscectomy as a surgical intervention .  The patient's history has been reviewed, patient examined, no change in status, stable for surgery.  I have reviewed the patient's chart and labs.  Questions were answered to the patient's satisfaction.     [name]

## 2018-12-01 ENCOUNTER — Encounter (HOSPITAL_COMMUNITY): Payer: Self-pay | Admitting: Neurosurgery

## 2018-12-01 DIAGNOSIS — M1990 Unspecified osteoarthritis, unspecified site: Secondary | ICD-10-CM | POA: Diagnosis not present

## 2018-12-01 DIAGNOSIS — Z7984 Long term (current) use of oral hypoglycemic drugs: Secondary | ICD-10-CM | POA: Diagnosis not present

## 2018-12-01 DIAGNOSIS — R262 Difficulty in walking, not elsewhere classified: Secondary | ICD-10-CM | POA: Diagnosis not present

## 2018-12-01 DIAGNOSIS — Z791 Long term (current) use of non-steroidal anti-inflammatories (NSAID): Secondary | ICD-10-CM | POA: Diagnosis not present

## 2018-12-01 DIAGNOSIS — M5116 Intervertebral disc disorders with radiculopathy, lumbar region: Secondary | ICD-10-CM | POA: Diagnosis not present

## 2018-12-01 DIAGNOSIS — Z79899 Other long term (current) drug therapy: Secondary | ICD-10-CM | POA: Diagnosis not present

## 2018-12-01 DIAGNOSIS — M4316 Spondylolisthesis, lumbar region: Secondary | ICD-10-CM | POA: Diagnosis not present

## 2018-12-01 DIAGNOSIS — I10 Essential (primary) hypertension: Secondary | ICD-10-CM | POA: Diagnosis not present

## 2018-12-01 DIAGNOSIS — E119 Type 2 diabetes mellitus without complications: Secondary | ICD-10-CM | POA: Diagnosis not present

## 2018-12-01 LAB — GLUCOSE, CAPILLARY: Glucose-Capillary: 173 mg/dL — ABNORMAL HIGH (ref 70–99)

## 2018-12-01 MED ORDER — HYDROCODONE-ACETAMINOPHEN 7.5-325 MG PO TABS: 1.0000 | ORAL_TABLET | ORAL | 0 refills | Status: DC | PRN

## 2018-12-01 MED ORDER — METHOCARBAMOL 500 MG PO TABS: 500.0000 mg | ORAL_TABLET | Freq: Four times a day (QID) | ORAL | 1 refills | Status: AC | PRN

## 2018-12-01 NOTE — Progress Notes (Addendum)
Subjective: Patient reports "I feel so much better than before... just a little tingling in my toes"  Objective: Vital signs in last 24 hours: Temp:  [97.7 F (36.5 C)-98.9 F (37.2 C)] 98.3 F (36.8 C) (12/13 0722) Pulse Rate:  [70-95] 70 (12/13 0722) Resp:  [8-21] 18 (12/13 0722) BP: (98-147)/(40-85) 137/70 (12/13 0722) SpO2:  [93 %-100 %] 98 % (12/13 0722)  Intake/Output from previous day: 12/12 0701 - 12/13 0700 In: 1249.8 [I.V.:999.8; IV Piggyback:100] Out: 50 [Blood:50] Intake/Output this shift: No intake/output data recorded.  Alert, conversant, sitting on edge of bed. Denies pain at present, noting some lumbar pain with activity. She notes some tingling in the toes right foot. Strength is full BLE. Incision is flat without eryhema or drainage beneath honeycomb and Dermabond.   Lab Results: Recent Labs    11/30/18 0630  WBC 9.0  HGB 12.6  HCT 39.9  PLT 195   BMET Recent Labs    11/30/18 0630  NA 140  K 4.5  CL 103  CO2 27  GLUCOSE 168*  BUN 18  CREATININE 0.73  CALCIUM 9.4    Studies/Results: Dg Lumbar Spine 1 View  Result Date: 11/30/2018 CLINICAL DATA:  62 year old female undergoing lumbar surgery. EXAM: LUMBAR SPINE - 1 VIEW COMPARISON:  Lumbar MRI 11/15/2018.  Lumbar radiographs 11/08/2018. FINDINGS: Normal lumbar segmentation demonstrated on the November radiographs. Intraoperative portable cross-table lateral view of the lumbar spine labeled image #1 at 0755 hours. Posterior surgical probe directed at the L5-S1 disc space. IMPRESSION: Intraoperative localization at L5-S1. Electronically Signed   By: Odessa Fleming M.D.   On: 11/30/2018 10:36    Assessment/Plan: Improving  LOS: 1 day  Ok to d/c to home per Dr. Venetia Maxon. Rx's for Norco 7.5/325 and Robacin 500mg  will be sent to her pharmacy from office. She hass office f/u already scheduled. She verbalizes understanding of d/c instructions.   Georgiann Cocker 12/01/2018, 7:31 AM   Patient is doing well.   Discharge home.

## 2018-12-01 NOTE — Discharge Summary (Signed)
Physician Discharge Summary  Patient ID: ARCHER PAJARES MRN: [id number] DOB/AGE: 1956/01/25 62 y.o.  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 Following uncomplicated redo microdiscectomy Right L5-S1, she recovered nicely and transferred to [facility] for [facility] care. She has mobilized well.   Consults: None  Significant Diagnostic Studies: radiology: X-Ray: intra-op  Treatments: surgery: Redo Right Lumbar five Sacral one Microdiscectomy (Right) with microdissection     Discharge Exam: Blood pressure 137/70, pulse 70, temperature 98.3 F (36.8 C), temperature source Oral, resp. rate 18, height 5\' 4"  (1.626 m), weight 127 kg, SpO2 98 %. Alert, conversant, sitting on edge of bed. Denies pain at present, noting some lumbar pain with activity. She notes some tingling in the toes right foot. Strength is full BLE. Incision is flat without eryhema or drainage beneath honeycomb and Dermabond.      Disposition: Discharge disposition: 01-Home or Self Care Rx's for Norco 7.5/325 and Robacin 500mg  will be sent to her pharmacy from office. She hass office f/u already scheduled. She verbalizes understanding of d/c instructions.          Discharge Instructions    Diet - low sodium heart healthy   Complete by:  As directed    Increase activity slowly   Complete by:  As directed      Allergies as of 12/01/2018   No Known Allergies     Medication List     TAKE these medications   bisoprolol 5 MG tablet Commonly known as:  ZEBETA Take 1 tablet (5 mg total) by mouth daily.   CINNAMON PO Take 2 capsules by mouth daily.   HYDROcodone-acetaminophen 7.5-325 MG tablet Commonly known as:  NORCO Take 1 tablet by mouth every 4 (four) hours as needed for moderate pain or severe pain ((score 7 to 10)).   hydroxypropyl methylcellulose / hypromellose 2.5 % ophthalmic solution Commonly known as:  ISOPTO TEARS / GONIOVISC Place 1 drop into both eyes daily.   ibuprofen 200 MG tablet Commonly known as:  ADVIL,MOTRIN Take 800 mg by mouth 2 (two) times daily as needed (for pain.).   losartan 25 MG tablet Commonly known as:  COZAAR Take 1 tablet (25 mg total) by mouth daily.   metFORMIN 500 MG tablet Commonly known as:  GLUCOPHAGE Take 500-1,000 mg by mouth See admin instructions. Take 2 tabs by mouth every morning & 1 tab every evening   methocarbamol 500 MG tablet Commonly known as:  ROBAXIN Take 1 tablet (500 mg total) by mouth every 6 (six) hours as needed for muscle spasms.   pantoprazole 40 MG tablet Commonly known as:  PROTONIX Take 40 mg by mouth every evening.   pioglitazone 15 MG tablet Commonly known as:  ACTOS Take 15 mg by mouth daily.   pravastatin 10 MG tablet Commonly known as:  PRAVACHOL Take 10 mg by mouth daily.   spironolactone 25 MG tablet Commonly known as:  ALDACTONE Take 1 tablet (25 mg total) by mouth every other day.        Signed: Poteat,  Arlys John 12/01/2018, 7:35 AM

## 2018-12-01 NOTE — Progress Notes (Signed)
Discharge instructions, RX's and follow up appts explained and provided to patient and caregiver verbalized understanding. Patient left floor via wheelchair accompanied by staff.  Hebert Dooling Lynn, RN  

## 2018-12-01 NOTE — Discharge Instructions (Signed)

## 2018-12-01 NOTE — Discharge Summary (Signed)
Physician Discharge Summary  Patient ID: Dawn Weiss MRN: [id number] DOB/AGE: Aug 22, 1956 62 y.o.  Admit date: 11/30/2018 Discharge date: 12/01/2018  Admission Diagnoses:Spondylolisthesis, Lumbar region, herniated lumbar disc, lumbar radiculopathy, lumbago L 5 S 1 level    Discharge Diagnoses: Spondylolisthesis, Lumbar region, herniated lumbar disc, lumbar radiculopathy, lumbago L 5 S 1 level s/p Redo Right Lumbar five Sacral one Microdiscectomy (Right) with microdissection     Active Problems:   Herniated lumbar disc without myelopathy   Discharged Condition: good  Hospital Course: Dawn Weiss was admitted with dx HNP and spondylolisthesis and radiculopathy. Following uncomplicated redo microdiscectomy Right L5-S1, she recovered nicely and transferred to [facility] for nursing care. She has mobilized well.   Consults: None  Significant Diagnostic Studies: radiology: X-Ray: intra-op  Treatments: surgery: Redo Right Lumbar five Sacral one Microdiscectomy (Right) with microdissection    Discharge Exam: Blood pressure 137/70, pulse 70, temperature 98.3 F (36.8 C), temperature source Oral, resp. rate 18, height 5\' 4"  (1.626 m), weight 127 kg, SpO2 98 %. Alert, conversant, sitting on edge of bed. Denies pain at present, noting some lumbar pain with activity. She notes some tingling in the toes right foot. Strength is full BLE. Incision is flat without eryhema or drainage beneath honeycomb and Dermabond.    Disposition: Discharge disposition: 01-Home or Self Care Rx's for Norco 7.5/325 and Robacin 500mg  will be sent to her pharmacy from office. She hass office f/u already scheduled. She verbalizes understanding of d/c instructions.     Discharge Instructions    Diet - low sodium heart healthy   Complete by:  As directed    Increase activity slowly   Complete by:  As directed      Allergies as of 12/01/2018   No Known Allergies     Medication  List    TAKE these medications   bisoprolol 5 MG tablet Commonly known as:  ZEBETA Take 1 tablet (5 mg total) by mouth daily.   CINNAMON PO Take 2 capsules by mouth daily.   HYDROcodone-acetaminophen 7.5-325 MG tablet Commonly known as:  NORCO Take 1 tablet by mouth every 4 (four) hours as needed for moderate pain or severe pain ((score 7 to 10)).   hydroxypropyl methylcellulose / hypromellose 2.5 % ophthalmic solution Commonly known as:  ISOPTO TEARS / GONIOVISC Place 1 drop into both eyes daily.   ibuprofen 200 MG tablet Commonly known as:  ADVIL,MOTRIN Take 800 mg by mouth 2 (two) times daily as needed (for pain.).   losartan 25 MG tablet Commonly known as:  COZAAR Take 1 tablet (25 mg total) by mouth daily.   metFORMIN 500 MG tablet Commonly known as:  GLUCOPHAGE Take 500-1,000 mg by mouth See admin instructions. Take 2 tabs by mouth every morning & 1 tab every evening   methocarbamol 500 MG tablet Commonly known as:  ROBAXIN Take 1 tablet (500 mg total) by mouth every 6 (six) hours as needed for muscle spasms.   pantoprazole 40 MG tablet Commonly known as:  PROTONIX Take 40 mg by mouth every evening.   pioglitazone 15 MG tablet Commonly known as:  ACTOS Take 15 mg by mouth daily.   pravastatin 10 MG tablet Commonly known as:  PRAVACHOL Take 10 mg by mouth daily.   spironolactone 25 MG tablet Commonly known as:  ALDACTONE Take 1 tablet (25 mg total) by mouth every other day.        Signed: [name], MD 12/01/2018, 8:06 AM

## 2018-12-01 NOTE — Evaluation (Signed)
Occupational Therapy Evaluation Patient Details Name: Dawn Weiss MRN: [id number] DOB: 07-01-1956 Today's Date: 12/01/2018    History of Present Illness Patient with history of DM, cardiomyopathy, obesity and arthritis admitted with L5-S1 spondylolisthesis, and HNP with radiculopathy now s/p Redo Right Lumbar five Sacral one Microdiscectomy   Clinical Impression   Patient evaluated by Occupational Therapy with no further acute OT needs identified. All education has been completed and the patient has no further questions. Pt is able to perform ADLs mod I - min A.  All education completed.  See below for any follow-up Occupational Therapy or equipment needs. OT is signing off. Thank you for this referral.      Follow Up Recommendations  No OT follow up;Supervision - Intermittent    Equipment Recommendations  None recommended by OT    Recommendations for Other Services       Precautions / Restrictions Precautions Precautions: Fall;Back Precaution Booklet Issued: Yes (comment) Precaution Comments: Reviewed back precautions.  Pt verbalizes and demonstrates understanding of back precautions       Mobility Bed Mobility Overal bed mobility: Needs Assistance Bed Mobility: Rolling;Sidelying to Sit;Sit to Sidelying Rolling: Supervision Sidelying to sit: Supervision     Sit to sidelying: Supervision    Transfers Overall transfer level: Modified independent                    Balance Overall balance assessment: Mild deficits observed, not formally tested                                         ADL either performed or assessed with clinical judgement   ADL Overall ADL's : Needs assistance/impaired Eating/Feeding: Independent   Grooming: Wash/dry hands;Wash/dry face;Oral care;Brushing hair;Supervision/safety;Standing Grooming Details (indicate cue type and reason): Pt able to verbalize safe method for oral care  Upper Body Bathing: Supervision/  safety;Standing   Lower Body Bathing: Supervison/ safety;Sit to/from stand Lower Body Bathing Details (indicate cue type and reason): able to cross ankles over knees  Upper Body Dressing : Supervision/safety;Sitting   Lower Body Dressing: Supervision/safety;Sit to/from stand Lower Body Dressing Details (indicate cue type and reason): able to cross ankles over knees Toilet Transfer: Modified Independent;Ambulation;Comfort height toilet Toilet Transfer Details (indicate cue type and reason): uses vanity for support  Toileting- Clothing Manipulation and Hygiene: Sit to/from stand Toileting - Clothing Manipulation Details (indicate cue type and reason): reviewed safe technqiues for peri care.  Pt refused simulation or practice and states she will figure it out  Tub/ Shower Transfer: Tub transfer;Supervision/safety;Ambulation   Functional mobility during ADLs: Supervision/safety General ADL Comments: Pt reports she thinks she has a Sports administrator she can use at home      Vision         Perception     Praxis      Pertinent Vitals/Pain Pain Assessment: 0-10 Pain Score: 4  Pain Location: back  Pain Descriptors / Indicators: Operative site guarding Pain Intervention(s): Monitored during session     Hand Dominance     Extremity/Trunk Assessment Upper Extremity Assessment Upper Extremity Assessment: Overall WFL for tasks assessed   Lower Extremity Assessment Lower Extremity Assessment: Overall WFL for tasks assessed       Communication Communication Communication: No difficulties   Cognition Arousal/Alertness: Awake/alert Behavior During Therapy: WFL for tasks assessed/performed Overall Cognitive Status: Within Functional Limits for tasks assessed  General Comments  Pt instructed in safety with basic IADLs     Exercises     Shoulder Instructions      Home Living Family/patient expects to be discharged to:: Private  residence Living Arrangements: Spouse/significant other Available Help at Discharge: Family;Available 24 hours/day Type of Home: House Home Access: Stairs to enter [employer] of Steps: 2 Entrance Stairs-Rails: None Home Layout: One level     Bathroom Shower/Tub: Chief Strategy Officer: Handicapped height     Home Equipment: Environmental consultant - 4 wheels;Adaptive equipment Adaptive Equipment: Reacher        Prior Functioning/Environment Level of Independence: Independent                 OT Problem List: Decreased knowledge of precautions;Pain      OT Treatment/Interventions:      OT Goals(Current goals can be found in the care plan section) Acute Rehab OT Goals Patient Stated Goal: to feel better and go back to work  OT Goal Formulation: All assessment and education complete, DC therapy  OT Frequency:     Barriers to D/C:            Co-evaluation              AM-PAC OT "6 Clicks" Daily Activity     Outcome Measure Help from another person eating meals?: None Help from another person taking care of personal grooming?: None Help from another person toileting, which includes using toliet, bedpan, or urinal?: A Little Help from another person bathing (including washing, rinsing, drying)?: None Help from another person to put on and taking off regular upper body clothing?: None Help from another person to put on and taking off regular lower body clothing?: None 6 Click Score: 23   End of Session Nurse Communication: Mobility status  Activity Tolerance: Patient tolerated treatment well Patient left: in bed;with call bell/phone within reach  OT Visit Diagnosis: Pain Pain - part of body: (back)                Time: 4098-1191 OT Time Calculation (min): 15 min Charges:  OT General Charges $OT Visit: 1 Visit OT Evaluation $OT Eval Low Complexity: 1 Low  Jeani Hawking, OTR/L Acute Rehabilitation Services Pager [phone] Office  [phone]   Jeani Hawking M 12/01/2018, 10:18 AM

## 2018-12-06 ENCOUNTER — Other Ambulatory Visit: Payer: BLUE CROSS/BLUE SHIELD

## 2019-01-10 ENCOUNTER — Other Ambulatory Visit: Payer: Self-pay | Admitting: Cardiology

## 2019-03-05 ENCOUNTER — Telehealth: Payer: Self-pay | Admitting: Cardiology

## 2019-03-05 NOTE — Telephone Encounter (Signed)
Patient called stating that she is on the KETO diet. States that she was told this is not a good diet for a heart patient.  [phone].

## 2019-03-12 NOTE — Telephone Encounter (Signed)
We have left multiple messages to return call

## 2019-03-14 ENCOUNTER — Other Ambulatory Visit: Payer: BLUE CROSS/BLUE SHIELD

## 2019-04-02 DIAGNOSIS — M4316 Spondylolisthesis, lumbar region: Secondary | ICD-10-CM | POA: Diagnosis not present

## 2019-04-02 DIAGNOSIS — M545 Low back pain: Secondary | ICD-10-CM | POA: Diagnosis not present

## 2019-04-02 DIAGNOSIS — M5127 Other intervertebral disc displacement, lumbosacral region: Secondary | ICD-10-CM | POA: Diagnosis not present

## 2019-04-02 DIAGNOSIS — M5416 Radiculopathy, lumbar region: Secondary | ICD-10-CM | POA: Diagnosis not present

## 2019-04-09 DIAGNOSIS — M545 Low back pain: Secondary | ICD-10-CM | POA: Diagnosis not present

## 2019-04-11 DIAGNOSIS — M48061 Spinal stenosis, lumbar region without neurogenic claudication: Secondary | ICD-10-CM | POA: Diagnosis not present

## 2019-04-11 DIAGNOSIS — M5416 Radiculopathy, lumbar region: Secondary | ICD-10-CM | POA: Diagnosis not present

## 2019-04-18 DIAGNOSIS — M545 Low back pain: Secondary | ICD-10-CM | POA: Diagnosis not present

## 2019-04-18 DIAGNOSIS — M4316 Spondylolisthesis, lumbar region: Secondary | ICD-10-CM | POA: Diagnosis not present

## 2019-04-18 DIAGNOSIS — M5416 Radiculopathy, lumbar region: Secondary | ICD-10-CM | POA: Diagnosis not present

## 2019-04-18 DIAGNOSIS — M5127 Other intervertebral disc displacement, lumbosacral region: Secondary | ICD-10-CM | POA: Diagnosis not present

## 2019-04-25 ENCOUNTER — Other Ambulatory Visit: Payer: BLUE CROSS/BLUE SHIELD

## 2019-05-05 ENCOUNTER — Other Ambulatory Visit: Payer: Self-pay

## 2019-05-05 ENCOUNTER — Emergency Department (HOSPITAL_COMMUNITY): Payer: BLUE CROSS/BLUE SHIELD

## 2019-05-05 ENCOUNTER — Emergency Department (HOSPITAL_COMMUNITY)
Admission: EM | Admit: 2019-05-05 | Discharge: 2019-05-06 | Disposition: A | Discharge status: Home / Self Care | Payer: BLUE CROSS/BLUE SHIELD | Attending: Emergency Medicine | Admitting: Emergency Medicine

## 2019-05-05 ENCOUNTER — Encounter (HOSPITAL_COMMUNITY): Payer: Self-pay | Admitting: *Deleted

## 2019-05-05 DIAGNOSIS — Z79899 Other long term (current) drug therapy: Secondary | ICD-10-CM | POA: Diagnosis not present

## 2019-05-05 DIAGNOSIS — M545 Low back pain, unspecified: Secondary | ICD-10-CM

## 2019-05-05 DIAGNOSIS — Z7984 Long term (current) use of oral hypoglycemic drugs: Secondary | ICD-10-CM | POA: Insufficient documentation

## 2019-05-05 DIAGNOSIS — E119 Type 2 diabetes mellitus without complications: Secondary | ICD-10-CM | POA: Diagnosis not present

## 2019-05-05 MED ORDER — KETOROLAC TROMETHAMINE 30 MG/ML IJ SOLN
30.0000 mg | Freq: Once | INTRAMUSCULAR | Status: AC
  Administered 2019-05-05: 30 mg via INTRAVENOUS
  Filled 2019-05-05: qty 1

## 2019-05-05 MED ORDER — DIAZEPAM 2 MG PO TABS
2.0000 mg | ORAL_TABLET | Freq: Once | ORAL | Status: AC
  Administered 2019-05-05: 22:00:00 2 mg via ORAL
  Filled 2019-05-05: qty 1

## 2019-05-05 MED ORDER — MORPHINE SULFATE (PF) 4 MG/ML IV SOLN
4.0000 mg | Freq: Once | INTRAVENOUS | Status: AC
  Administered 2019-05-05: 22:00:00 4 mg via INTRAVENOUS
  Filled 2019-05-05: qty 1

## 2019-05-05 NOTE — ED Provider Notes (Signed)
[facility] Provider Note   CSN: [id number] Arrival date & time: 05/05/19  2101    History   Chief Complaint Chief Complaint  Patient presents with  . Back Pain    HPI Iysis E Husted is a 63 y.o. female.     The history is provided by the patient and medical records. No language interpreter was used.   Amedeo PlentyRomona E Ransome is a 63 y.o. female  with a PMH of as listed below who presents to the Emergency Department complaining of cute onset of left-sided low back pain which began 2 days ago.  Patient states that she stepped up onto a stair to get into her house when she suddenly felt a pop in her low back and had severe pain.  Pain is constant, but does get worse with certain movements.  She denies any numbness.  No saddle anesthesia.  She does have a history of herniated disks in several neurosurgical procedures, followed by Dr. Venetia MaxonStern.  Most recently, had a redo of her right L5-S1 microdiscectomy in December 2019.  Given it is the weekend, she has not called to arrange follow-up.  Denies upper back or neck pain.  No urinary complaints including retention or incontinence.  Denies IV drug use.  She did have a temperature of 100.2 at triage.  She denies any infectious symptoms and states that she was unaware that she had a temperature.   Past Medical History:  Diagnosis Date  . Arthritis   . Diabetes mellitus, type 2 (HCC)   . Heart murmur    at birth only  . History of pneumonia   . HNP (herniated nucleus pulposus), lumbar    of lumbosacral area  . Idiopathic cardiomyopathy (HCC)    Subsequent normalization of LVEF  . Obesity   . Pneumonia   . Syncope    Neurocardiogenic    Patient Active Problem List   Diagnosis Date Noted  . Herniated lumbar disc without myelopathy 11/30/2018  . Primary osteoarthritis of left knee 05/02/2015  . Vasovagal syncope 09/13/2012  . Morbid obesity (HCC) 05/31/2011  . Secondary cardiomyopathy (HCC)  12/31/2009    Past Surgical History:  Procedure Laterality Date  . ABDOMINAL HYSTERECTOMY    . CATARACT EXTRACTION W/ INTRAOCULAR LENS  IMPLANT, BILATERAL    . DILATION AND CURETTAGE OF UTERUS    . Left knee arthroscopic surgery    . LUMBAR LAMINECTOMY/DECOMPRESSION MICRODISCECTOMY Right 11/30/2018   Procedure: Redo Right Lumbar five Sacral one Microdiscectomy;  Surgeon: Maeola HarmanStern, Joseph, MD;  [facility];  Service: Neurosurgery;  Laterality: Right;  . MICRODISCECTOMY LUMBAR  11/10   Right L5-S1 / 2010 and 2014  . TOTAL KNEE ARTHROPLASTY Left 05/02/2015   Procedure: LEFT TOTAL KNEE ARTHROPLASTY;  Surgeon: Samson FredericBrian Swinteck, MD;  Location: [facility] ORS;  Service: Orthopedics;  Laterality: Left;  . TUBAL LIGATION       OB History   No obstetric history on file.      Home Medications    Prior to Admission medications   Medication Sig Start Date End Date Taking? Authorizing Provider  bisoprolol (ZEBETA) 5 MG tablet TAKE 1 TABLET BY MOUTH  DAILY 01/10/19   Jonelle SidleMcDowell, Samuel G, MD  CINNAMON PO Take 2 capsules by mouth daily.    [provider]  HYDROcodone-acetaminophen (NORCO) 7.5-325 MG tablet Take 1 tablet by mouth every 4 (four) hours as needed for moderate pain or severe pain ((score 7 to 10)). 12/01/18   Maeola HarmanStern, Joseph, MD  hydroxypropyl methylcellulose / hypromellose (ISOPTO TEARS / GONIOVISC) 2.5 % ophthalmic solution Place 1 drop into both eyes daily.    [provider]  ibuprofen (ADVIL,MOTRIN) 200 MG tablet Take 800 mg by mouth 2 (two) times daily as needed (for pain.).    [provider]  losartan (COZAAR) 25 MG tablet TAKE 1 TABLET BY MOUTH  DAILY 01/10/19   [name], MD  metFORMIN (GLUCOPHAGE) 500 MG tablet Take 500-1,000 mg by mouth See admin instructions. Take 2 tabs by mouth every morning & 1 tab every evening 10/18/18   [provider]  methocarbamol (ROBAXIN) 500 MG tablet Take 1 tablet (500 mg total) by mouth every 6 (six) hours as  needed for muscle spasms. 12/01/18   Maeola Harman, MD  pantoprazole (PROTONIX) 40 MG tablet Take 40 mg by mouth every evening.  09/01/18   [provider]  pioglitazone (ACTOS) 15 MG tablet Take 15 mg by mouth daily.  08/24/18   [provider]  pravastatin (PRAVACHOL) 10 MG tablet Take 10 mg by mouth daily.  08/16/18   [provider]  spironolactone (ALDACTONE) 25 MG tablet TAKE 1 TABLET BY MOUTH  EVERY OTHER DAY 01/10/19   [name], MD    Family History Family History  Problem Relation Age of Onset  . Hypertension Mother   . Heart disease Mother   . Diabetes Mother   . Hypercholesterolemia Mother   . Asthma Father   . Hypertension Father     Social History Social History   Tobacco Use  . Smoking status: Never Smoker  . Smokeless tobacco: Never Used  Substance Use Topics  . Alcohol use: No    Alcohol/week: 0.0 standard drinks  . Drug use: No     Allergies   Patient has no known allergies.   Review of Systems Review of Systems  HENT: Negative for congestion and sore throat.   Respiratory: Negative for cough and shortness of breath.   Cardiovascular: Negative for chest pain.  Gastrointestinal: Negative for abdominal pain, diarrhea, nausea and vomiting.  Genitourinary: Negative for dysuria.  Musculoskeletal: Positive for back pain.  Skin: Negative for rash.  Neurological: Negative for weakness and numbness.     Physical Exam Updated Vital Signs BP (!) 143/76   Pulse 82   Temp 99.4 F (37.4 C)   Resp 18   SpO2 95%   Physical Exam Vitals signs and nursing note reviewed.  Constitutional:      Appearance: She is well-developed.  Neck:     Comments: No midline or paraspinal tenderness. Full ROM without pain. Cardiovascular:     Rate and Rhythm: Normal rate and regular rhythm.     Heart sounds: Normal heart sounds.  Pulmonary:     Effort: Pulmonary effort is normal. No respiratory distress.     Breath sounds: Normal breath  sounds.  Abdominal:     General: Bowel sounds are normal. There is no distension.     Palpations: Abdomen is soft.     Tenderness: There is no abdominal tenderness.  Musculoskeletal:       Back:     Comments: Tenderness to palpation as depicted in image. 5/5 muscle strength in bilateral LE's. Decreased ROM to left hip likely 2/2 pain. Straight leg raises are negative bilaterally for radicular symptoms. Able to ambulate independently with steady gait.  Skin:    General: Skin is warm and dry.     Findings: No erythema or rash.  Neurological:  Mental Status: She is alert and oriented to person, place, and time.     Deep Tendon Reflexes: Reflexes are normal and symmetric.     Comments: Bilateral lower extremities neurovascularly intact.      ED Treatments / Results  Labs (all labs ordered are listed, but only abnormal results are displayed) Labs Reviewed - No data to display  EKG None  Radiology Dg Lumbar Spine Complete  Result Date: 05/05/2019 CLINICAL DATA:  Lumbosacral back pain radiating down left leg. EXAM: LUMBAR SPINE - COMPLETE 4+ VIEW COMPARISON:  Lumbar spine MRI 04/11/2019, Radiograph 11/08/2018 FINDINGS: No right-sided oblique done due to patient's difficulty with positioning due to pain. Unchanged 4 mm anterolisthesis of L4 on L5. Disc space narrowing at L4-L5 and L2-L3. Facet hypertrophy at L4-L5 and L5-S1. No acute fracture. Vertebral body heights are preserved. Sacroiliac joints are congruent. 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 Electronically Signed   By: Narda Rutherford M.D.   On: 05/05/2019 23:23    Procedures Procedures (including critical care time)  Medications Ordered in ED Medications  morphine 4 MG/ML injection 4 mg (4 mg Intravenous Given 05/05/19 2228)  ketorolac (TORADOL) 30 MG/ML injection 30 mg (30 mg Intravenous Given 05/05/19 2228)  diazepam (VALIUM) tablet 2 mg (2 mg  Oral Given 05/05/19 2228)     Initial Impression / Assessment and Plan / ED Course  I have reviewed the triage vital signs and the nursing notes.  Pertinent labs & imaging results that were available during my care of the patient were reviewed by me and considered in my medical decision making (see chart for details).       ANETH SCHLAGEL is a 63 y.o. female who presents to ED for acute onset of low back pain two days ago.  On exam today, patient demonstrates no lower extremity weakness.  Neurovascularly intact and ambulatory without assistance.  Her tenderness is left-sided with minimal midline tenderness.  She denies any numbness, weakness, saddle anesthesia, bowel or bladder incontinence. Doubt cauda equina.   Patient alert by neurosurgery, Dr. Venetia Maxon, with outpatient MRI in PACS from 4/22 showing:   1. At L5-S1 there is a small right foraminal disc protrusion with mild surrounding enhancement likely reflecting concomitant fibrosis.  Moderate to severe right foraminal stenosis.  Prior right laminectomy.  Mild bilateral facet arthropathy. 2.  L4-5 there is a prior left laminectomy.  Mild broad-based disc bulge.  Moderate bilateral facet arthropathy, left greater than right.  X-ray obtained with degenerative changes without acute abnormalities.  Patient did have a temperature of 100.2 in triage.  This improved to 99.4 right BEFORE antipyretics were given.  Considered epidural abscess.  Given that her temperature improved prior to antipyretic, along with left sided pain on exam with minimal midline tenderness, feel this is much less likely.  She also gives clear history of lifting her leg up on a step and hearing a pop.  Her exam is more consistent with MSK etiology as well.  She has good follow-up and reports being able to call her neurosurgeon on Monday.  While I do believe that this is likely musculoskeletal in etiology, given her low-grade temp in triage, I did recommend that she do every 4  hour temperature checks at home.  If she develops a fever 100.4 or higher while still having back pain, she should return to the ER immediately for reevaluation. Patient voiced understanding and agreement with plan. All questions answered.  Final Clinical Impressions(s) / ED Diagnoses   Final diagnoses:  Acute left-sided low back pain without sciatica    ED Discharge Orders    None       Aiva Miskell, Chase Picket, PA-C 05/05/19 2348    Alvira Monday, MD 05/07/19 [phone]

## 2019-05-05 NOTE — ED Notes (Signed)
Patient transported to X-ray 

## 2019-05-05 NOTE — ED Notes (Signed)
Pt ambulatory to chair

## 2019-05-05 NOTE — ED Triage Notes (Signed)
Pt says that Friday she stepped up on a step to go in her house. She says she heard a "pop" in the left lower back. She has had increased pain, has taken robaxin and hydrocodone for pain without relief. Pt endorses previous back and neck surgeries, followed by Dr. Venetia Maxon.

## 2019-05-05 NOTE — Discharge Instructions (Signed)
It was my pleasure taking care of you today!   You can take two of your home hydrocodone every 4 hours as needed for pain along with your muscle relaxer. You can also take up to 800mg  of ibuprofen every 8 hours as well.  In addition to this, use ice and/or heat for additional pain relief.  Check your temperature every 4 hours. If you have temperature higher than 100.4 and continue to have back pain, we need to see you back in the ER as soon as possible.   Call Dr. Fredrich Birks office first thing Monday morning to schedule a follow up appointment.   COLD THERAPY DIRECTIONS:  Ice or gel packs can be used to reduce both pain and swelling. Ice is the most helpful within the first 24 to 48 hours after an injury or flareup from overusing a muscle or joint.  Ice is effective, has very few side effects, and is safe for most people to use.    Return to the ED for worsening back pain, fever, weakness or numbness of either leg, or if you develop either (1) an inability to urinate or have bowel movements, or (2) loss of your ability to control your bathroom functions (if you start having "accidents"), or if you develop other new symptoms that concern you.

## 2019-05-06 NOTE — ED Notes (Signed)
Patient verbalizes understanding of discharge instructions. Opportunity for questioning and answers were provided. Armband removed by staff, pt discharged from ED.  

## 2019-05-21 DIAGNOSIS — M5416 Radiculopathy, lumbar region: Secondary | ICD-10-CM | POA: Diagnosis not present

## 2019-06-04 DIAGNOSIS — L600 Ingrowing nail: Secondary | ICD-10-CM | POA: Diagnosis not present

## 2019-06-04 DIAGNOSIS — M79675 Pain in left toe(s): Secondary | ICD-10-CM | POA: Diagnosis not present

## 2019-06-06 ENCOUNTER — Telehealth: Payer: Self-pay | Admitting: *Deleted

## 2019-06-06 ENCOUNTER — Ambulatory Visit (INDEPENDENT_AMBULATORY_CARE_PROVIDER_SITE_OTHER): Payer: BC Managed Care – PPO

## 2019-06-06 DIAGNOSIS — I428 Other cardiomyopathies: Secondary | ICD-10-CM | POA: Diagnosis not present

## 2019-06-06 NOTE — Telephone Encounter (Signed)
-----   Message from Satira Sark, MD sent at 06/06/2019  4:32 PM EDT ----- Results reviewed.  LVEF has improved to the range of 40 to 45%.  Would continue with current medications and make sure that she has follow-up scheduled.

## 2019-06-11 NOTE — Telephone Encounter (Signed)
Patient informed. Copy sent to PCP °

## 2019-06-11 NOTE — Telephone Encounter (Signed)
Returned call

## 2019-06-18 DIAGNOSIS — L600 Ingrowing nail: Secondary | ICD-10-CM | POA: Diagnosis not present

## 2019-06-18 DIAGNOSIS — M79675 Pain in left toe(s): Secondary | ICD-10-CM | POA: Diagnosis not present

## 2019-06-27 DIAGNOSIS — M545 Low back pain: Secondary | ICD-10-CM | POA: Diagnosis not present

## 2019-06-27 DIAGNOSIS — M961 Postlaminectomy syndrome, not elsewhere classified: Secondary | ICD-10-CM | POA: Diagnosis not present

## 2019-06-27 DIAGNOSIS — M4316 Spondylolisthesis, lumbar region: Secondary | ICD-10-CM | POA: Diagnosis not present

## 2019-08-16 IMAGING — CR DG LUMBAR SPINE 1V
1 series · 1 of 1 positions shown · non-contrast
Comparison: Lumbar MRI 11/15/2018.  Lumbar radiographs 11/08/2018.

CLINICAL DATA: 62-year-old female undergoing lumbar surgery.

EXAM:
LUMBAR SPINE - 1 VIEW

[lateral]
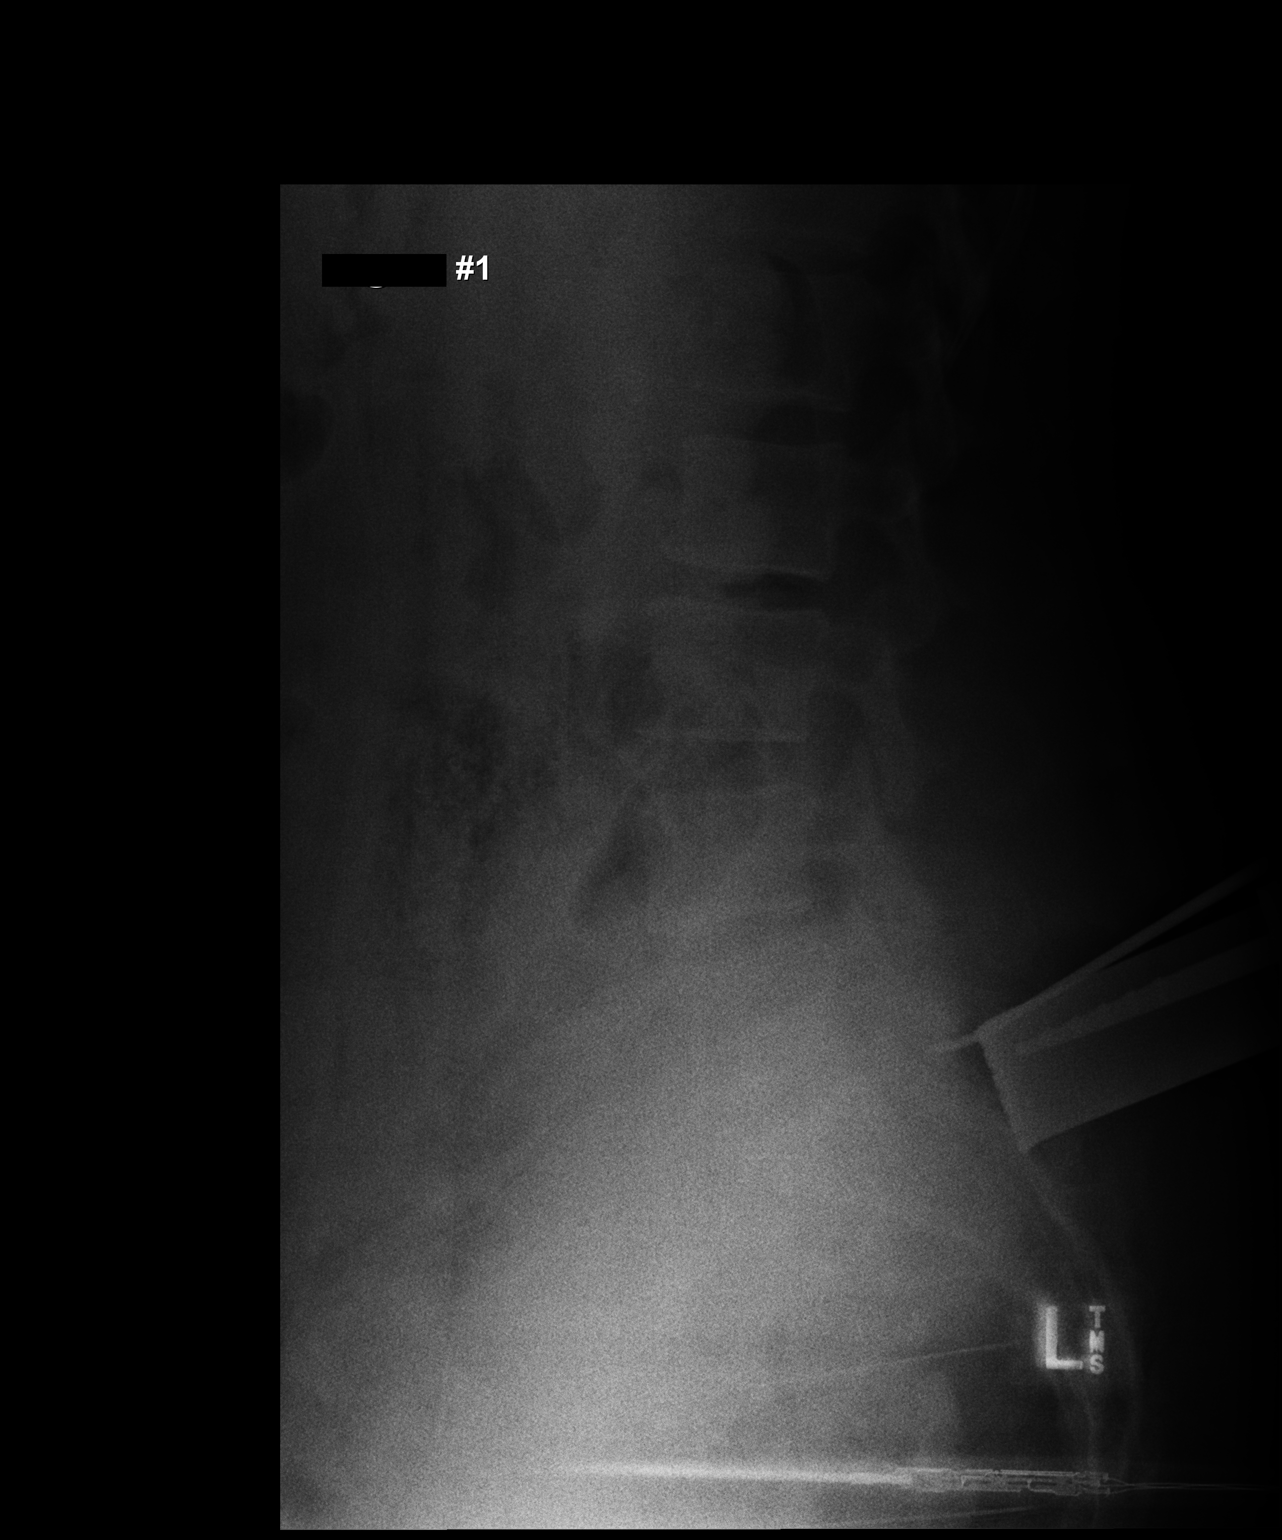

[1 of 1 positions shown; findings below may reference images not displayed]

FINDINGS: Normal lumbar segmentation demonstrated on the [facility] radiographs.
Intraoperative portable cross-table lateral view of the lumbar spine
labeled image #1 at 0100 hours. Posterior surgical probe directed at
the L5-S1 disc space.
IMPRESSION: Intraoperative localization at L5-S1.

## 2019-08-24 ENCOUNTER — Encounter: Payer: Self-pay | Admitting: Cardiology

## 2019-08-24 ENCOUNTER — Other Ambulatory Visit: Payer: Self-pay

## 2019-08-24 ENCOUNTER — Ambulatory Visit: Payer: BC Managed Care – PPO | Admitting: Cardiology

## 2019-08-24 VITALS — BP 132/80 | HR 78 | Ht 64.0 in | Wt 292.0 lb

## 2019-08-24 DIAGNOSIS — R55 Syncope and collapse: Secondary | ICD-10-CM | POA: Diagnosis not present

## 2019-08-24 DIAGNOSIS — I428 Other cardiomyopathies: Secondary | ICD-10-CM

## 2019-08-24 NOTE — Patient Instructions (Signed)
Your physician wants you to follow-up in: 6 MONTHS WITH DR MCDOWELL You will receive a reminder letter in the mail two months in advance. If you don't receive a letter, please call our office to schedule the follow-up appointment.  Your physician recommends that you continue on your current medications as directed. Please refer to the Current Medication list given to you today.  Your physician has requested that you have an echocardiogram JUST BEFORE YOUR NEXT APPOINTMENT WITH DR MCDOWELL Echocardiography is a painless test that uses sound waves to create images of your heart. It provides your doctor with information about the size and shape of your heart and how well your heart's chambers and valves are working. This procedure takes approximately one hour. There are no restrictions for this procedure.  Thank you for choosing [facility]!!    

## 2019-08-24 NOTE — Progress Notes (Signed)
Cardiology Office Note  Date: 08/24/2019   ID: Dawn Weiss, DOB [birth year], MRN [id number]  PCP:  Manon Hilding, MD  Cardiologist:  Rozann Lesches, MD Electrophysiologist:  None   Chief Complaint  Patient presents with  . Cardiac follow-up    History of Present Illness: Dawn Weiss is a 63 y.o. female last seen in November 2019.  She presents for a follow-up visit.  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  She has been having a lot of trouble with back pain and neuropathy, reports lumbar disc disease.  She is preparing to have a nerve block procedure.  This has limited her activity, she has gained back a lot of weight.  Follow-up echocardiogram in June showed improvement in LVEF in the range of 40 to 45%.  She has continued on bisoprolol, losartan, and Aldactone.  Today we discussed diet and weight loss strategies.  Past Medical History:  Diagnosis Date  . Arthritis   . Diabetes mellitus, type 2 ([place])   . Heart murmur    at birth only  . History of pneumonia   . HNP (herniated nucleus pulposus), lumbar    of lumbosacral area  . Idiopathic cardiomyopathy (HCC)    Subsequent normalization of LVEF  . Obesity   . Pneumonia   . Syncope    Neurocardiogenic    Past Surgical History:  Procedure Laterality Date  . ABDOMINAL HYSTERECTOMY    . CATARACT EXTRACTION W/ INTRAOCULAR LENS  IMPLANT, BILATERAL    . DILATION AND CURETTAGE OF UTERUS    . Left knee arthroscopic surgery    . LUMBAR LAMINECTOMY/DECOMPRESSION MICRODISCECTOMY Right 11/30/2018   Procedure: Redo Right Lumbar five Sacral one Microdiscectomy;  Surgeon: Erline Levine, MD;  Location: [facility];  Service: Neurosurgery;  Laterality: Right;  . MICRODISCECTOMY LUMBAR  11/10   Right L5-S1 / 2010 and 2014  . TOTAL KNEE ARTHROPLASTY Left 05/02/2015   Procedure: LEFT TOTAL KNEE ARTHROPLASTY;  Surgeon: Rod Can, MD;  Location: [facility];  Service: Orthopedics;  Laterality: Left;  . TUBAL  LIGATION      Current Outpatient Medications  Medication Sig Dispense Refill  . bisoprolol (ZEBETA) 5 MG tablet TAKE 1 TABLET BY MOUTH  DAILY 90 tablet 3  . CINNAMON PO Take 2 capsules by mouth daily.    Marland Kitchen gabapentin (NEURONTIN) 400 MG capsule Take 400 mg by mouth 3 (three) times daily.    Marland Kitchen HYDROcodone-acetaminophen (NORCO) 7.5-325 MG tablet Take 1 tablet by mouth every 4 (four) hours as needed for moderate pain or severe pain ((score 7 to 10)). 60 tablet 0  . hydroxypropyl methylcellulose / hypromellose (ISOPTO TEARS / GONIOVISC) 2.5 % ophthalmic solution Place 1 drop into both eyes daily.    Marland Kitchen ibuprofen (ADVIL,MOTRIN) 200 MG tablet Take 800 mg by mouth 2 (two) times daily as needed (for pain.).    Marland Kitchen losartan (COZAAR) 25 MG tablet TAKE 1 TABLET BY MOUTH  DAILY 90 tablet 3  . metFORMIN (GLUCOPHAGE) 500 MG tablet Take 500-1,000 mg by mouth See admin instructions. Take 2 tabs by mouth every morning & 1 tab every evening    . methocarbamol (ROBAXIN) 500 MG tablet Take 1 tablet (500 mg total) by mouth every 6 (six) hours as needed for muscle spasms. 60 tablet 1  . pantoprazole (PROTONIX) 40 MG tablet Take 40 mg by mouth every evening.     . pioglitazone (ACTOS) 15 MG tablet Take 15 mg by mouth daily.     Marland Kitchen  pravastatin (PRAVACHOL) 10 MG tablet Take 10 mg by mouth daily.     Marland Kitchen spironolactone (ALDACTONE) 25 MG tablet TAKE 1 TABLET BY MOUTH  EVERY OTHER DAY 45 tablet 3   No current facility-administered medications for this visit.    Allergies:  Patient has no known allergies.   Social History: The patient  reports that she has never smoked. She has never used smokeless tobacco. She reports that she does not drink alcohol or use drugs.   ROS:  Please see the history of present illness. Otherwise, complete review of systems is positive for chronic back pain and foot pain.  All other systems are reviewed and negative.   Physical Exam: VS:  BP 132/80   Pulse 78   Ht 5\' 4"  (1.626 m)   Wt 292 lb  (132.5 kg)   SpO2 98%   BMI 50.12 kg/m , BMI Body mass index is 50.12 kg/m.  Wt Readings from Last 3 Encounters:  08/24/19 292 lb (132.5 kg)  11/30/18 280 lb (127 kg)  11/01/18 278 lb 6.4 oz (126.3 kg)    General: Morbidly obese woman, appears comfortable at rest. HEENT: Conjunctiva and lids normal, wearing a mask. Neck: Supple, no elevated JVP or carotid bruits, no thyromegaly. Lungs: Clear to auscultation, nonlabored breathing at rest. Cardiac: Regular rate and rhythm, no S3 or significant systolic murmur, no pericardial rub. Abdomen: Obese, nontender, bowel sounds present. Extremities: No pitting edema, distal pulses 2+. Skin: Warm and dry. Musculoskeletal: No kyphosis. Neuropsychiatric: Alert and oriented x3, affect grossly appropriate.  ECG:  An ECG dated 11/01/2018 was personally reviewed today and demonstrated:  Sinus rhythm with PVCs and low voltage.  Recent Labwork: 11/30/2018: BUN 18; Creatinine, Ser 0.73; Hemoglobin 12.6; Platelets 195; Potassium 4.5; Sodium 140   Other Studies Reviewed Today:  Echocardiogram 06/06/2019:  1. The left ventricle has mild-moderately reduced systolic function, with an ejection fraction of 40-45%. The cavity size was normal. There is mildly increased left ventricular wall thickness. Left ventricular diastolic Doppler parameters are consistent  with impaired relaxation. Left ventricular diffuse hypokinesis.  2. The right ventricle has normal systolic function. The cavity was normal. There is no increase in right ventricular wall thickness. Right ventricular systolic pressure is normal.  3. The aortic valve is tricuspid. Mild aortic annular calcification noted.  4. The mitral valve is grossly normal.  5. The tricuspid valve is grossly normal.  6. The aortic root is normal in size and structure.  Assessment and Plan:  1.  Nonischemic cardiomyopathy, LVEF up to approximately 45% by her most recent echocardiogram.  Continue bisoprolol,  losartan, and Aldactone.  We will obtain a follow-up study prior to her next visit.  2.  Morbid obesity.  We discussed diet and weight loss.  This has been inhibited by lack of mobility with chronic back pain and disc disease.  3.  History of vasovagal syncope.  She has had no recurrences.  Medication Adjustments/Labs and Tests Ordered: Current medicines are reviewed at length with the patient today.  Concerns regarding medicines are outlined above.   Tests Ordered: Orders Placed This Encounter  Procedures  . ECHOCARDIOGRAM COMPLETE    Medication Changes: No orders of the defined types were placed in this encounter.   Disposition:  Follow up 6 months in the [facility] office.  Signed, [name], MD, [facility] 08/24/2019 4:35 PM    [facility] [address] [place], [place], [place] [address] Phone: [phone]; Fax: [phone]

## 2019-08-28 DIAGNOSIS — E1165 Type 2 diabetes mellitus with hyperglycemia: Secondary | ICD-10-CM | POA: Diagnosis not present

## 2019-08-28 DIAGNOSIS — Z13220 Encounter for screening for lipoid disorders: Secondary | ICD-10-CM | POA: Diagnosis not present

## 2019-08-28 DIAGNOSIS — E1140 Type 2 diabetes mellitus with diabetic neuropathy, unspecified: Secondary | ICD-10-CM | POA: Diagnosis not present

## 2019-08-31 DIAGNOSIS — I43 Cardiomyopathy in diseases classified elsewhere: Secondary | ICD-10-CM | POA: Diagnosis not present

## 2019-08-31 DIAGNOSIS — R42 Dizziness and giddiness: Secondary | ICD-10-CM | POA: Diagnosis not present

## 2019-08-31 DIAGNOSIS — Z23 Encounter for immunization: Secondary | ICD-10-CM | POA: Diagnosis not present

## 2019-08-31 DIAGNOSIS — G629 Polyneuropathy, unspecified: Secondary | ICD-10-CM | POA: Diagnosis not present

## 2019-09-04 DIAGNOSIS — T63441A Toxic effect of venom of bees, accidental (unintentional), initial encounter: Secondary | ICD-10-CM | POA: Diagnosis not present

## 2019-09-04 DIAGNOSIS — Z6841 Body Mass Index (BMI) 40.0 and over, adult: Secondary | ICD-10-CM | POA: Diagnosis not present

## 2019-09-14 DIAGNOSIS — R030 Elevated blood-pressure reading, without diagnosis of hypertension: Secondary | ICD-10-CM | POA: Diagnosis not present

## 2019-09-14 DIAGNOSIS — M5127 Other intervertebral disc displacement, lumbosacral region: Secondary | ICD-10-CM | POA: Diagnosis not present

## 2019-09-14 DIAGNOSIS — M961 Postlaminectomy syndrome, not elsewhere classified: Secondary | ICD-10-CM | POA: Diagnosis not present

## 2019-09-14 DIAGNOSIS — M542 Cervicalgia: Secondary | ICD-10-CM | POA: Diagnosis not present

## 2019-10-04 DIAGNOSIS — M4316 Spondylolisthesis, lumbar region: Secondary | ICD-10-CM | POA: Diagnosis not present

## 2019-10-04 DIAGNOSIS — M47816 Spondylosis without myelopathy or radiculopathy, lumbar region: Secondary | ICD-10-CM | POA: Diagnosis not present

## 2019-10-16 ENCOUNTER — Other Ambulatory Visit: Payer: Self-pay

## 2019-10-16 DIAGNOSIS — Z20822 Contact with and (suspected) exposure to covid-19: Secondary | ICD-10-CM

## 2019-10-18 LAB — NOVEL CORONAVIRUS, NAA: SARS-CoV-2, NAA: NOT DETECTED

## 2019-10-23 DIAGNOSIS — M47816 Spondylosis without myelopathy or radiculopathy, lumbar region: Secondary | ICD-10-CM | POA: Diagnosis not present

## 2019-11-06 DIAGNOSIS — M961 Postlaminectomy syndrome, not elsewhere classified: Secondary | ICD-10-CM | POA: Diagnosis not present

## 2019-11-06 DIAGNOSIS — R030 Elevated blood-pressure reading, without diagnosis of hypertension: Secondary | ICD-10-CM | POA: Diagnosis not present

## 2019-11-06 DIAGNOSIS — M47816 Spondylosis without myelopathy or radiculopathy, lumbar region: Secondary | ICD-10-CM | POA: Diagnosis not present

## 2019-11-06 DIAGNOSIS — Z6841 Body Mass Index (BMI) 40.0 and over, adult: Secondary | ICD-10-CM | POA: Diagnosis not present

## 2019-11-13 ENCOUNTER — Other Ambulatory Visit: Payer: Self-pay | Admitting: *Deleted

## 2019-11-13 DIAGNOSIS — Z20822 Contact with and (suspected) exposure to covid-19: Secondary | ICD-10-CM

## 2019-11-14 LAB — NOVEL CORONAVIRUS, NAA: SARS-CoV-2, NAA: NOT DETECTED

## 2019-12-10 ENCOUNTER — Other Ambulatory Visit: Payer: BC Managed Care – PPO

## 2019-12-12 ENCOUNTER — Ambulatory Visit: Payer: BC Managed Care – PPO | Attending: Family Medicine

## 2019-12-12 ENCOUNTER — Other Ambulatory Visit: Payer: Self-pay

## 2019-12-12 DIAGNOSIS — Z20828 Contact with and (suspected) exposure to other viral communicable diseases: Secondary | ICD-10-CM | POA: Diagnosis not present

## 2019-12-12 DIAGNOSIS — Z20822 Contact with and (suspected) exposure to covid-19: Secondary | ICD-10-CM

## 2019-12-14 LAB — NOVEL CORONAVIRUS, NAA: SARS-CoV-2, NAA: NOT DETECTED

## 2019-12-19 ENCOUNTER — Ambulatory Visit: Payer: BC Managed Care – PPO | Attending: Internal Medicine

## 2019-12-19 ENCOUNTER — Other Ambulatory Visit: Payer: Self-pay

## 2019-12-19 DIAGNOSIS — Z20828 Contact with and (suspected) exposure to other viral communicable diseases: Secondary | ICD-10-CM | POA: Diagnosis not present

## 2019-12-19 DIAGNOSIS — Z20822 Contact with and (suspected) exposure to covid-19: Secondary | ICD-10-CM

## 2019-12-20 LAB — NOVEL CORONAVIRUS, NAA: SARS-CoV-2, NAA: NOT DETECTED

## 2019-12-26 ENCOUNTER — Telehealth: Payer: Self-pay | Admitting: Cardiology

## 2019-12-26 NOTE — Telephone Encounter (Signed)
Message left on vm that covid vaccine is safe to take.

## 2019-12-26 NOTE — Telephone Encounter (Signed)
Patient called wanting to know if she can take the COVID vaccine with her heart problems.

## 2020-01-01 ENCOUNTER — Other Ambulatory Visit: Payer: Self-pay | Admitting: Cardiology

## 2020-01-04 ENCOUNTER — Other Ambulatory Visit: Payer: Self-pay

## 2020-01-04 ENCOUNTER — Ambulatory Visit: Payer: BC Managed Care – PPO | Attending: Internal Medicine

## 2020-01-04 DIAGNOSIS — Z20822 Contact with and (suspected) exposure to covid-19: Secondary | ICD-10-CM

## 2020-01-05 LAB — NOVEL CORONAVIRUS, NAA: SARS-CoV-2, NAA: DETECTED — AB

## 2020-01-06 ENCOUNTER — Telehealth: Payer: Self-pay | Admitting: Infectious Diseases

## 2020-01-06 ENCOUNTER — Other Ambulatory Visit: Payer: Self-pay | Admitting: Infectious Diseases

## 2020-01-06 DIAGNOSIS — U071 COVID-19: Secondary | ICD-10-CM

## 2020-01-06 DIAGNOSIS — E119 Type 2 diabetes mellitus without complications: Secondary | ICD-10-CM

## 2020-01-06 DIAGNOSIS — I429 Cardiomyopathy, unspecified: Secondary | ICD-10-CM

## 2020-01-06 DIAGNOSIS — I10 Essential (primary) hypertension: Secondary | ICD-10-CM

## 2020-01-06 NOTE — Progress Notes (Signed)
  I connected by phone with Dawn Weiss on 01/06/2020 at 12:56 PM to discuss the potential use of an new treatment for mild to moderate COVID-19 viral infection in non-hospitalized patients.  This patient is a 64 y.o. female that meets the FDA criteria for Emergency Use Authorization of bamlanivimab or casirivimab\imdevimab.  Has a (+) direct SARS-CoV-2 viral test result  Has mild or moderate COVID-19   Is ? 63 years of age and weighs ? 40 kg  Is NOT hospitalized due to COVID-19  Is NOT requiring oxygen therapy or requiring an increase in baseline oxygen flow rate due to COVID-19  Is within 10 days of symptom onset  Has at least one of the high risk factor(s) for progression to severe COVID-19 and/or hospitalization as defined in EUA.  Specific high risk criteria : Diabetes   I have spoken and communicated the following to the patient or parent/caregiver:  1. FDA has authorized the emergency use of bamlanivimab and casirivimab\imdevimab for the treatment of mild to moderate COVID-19 in adults and pediatric patients with positive results of direct SARS-CoV-2 viral testing who are 14 years of age and older weighing at least 40 kg, and who are at high risk for progressing to severe COVID-19 and/or hospitalization.  2. The significant known and potential risks and benefits of bamlanivimab and casirivimab\imdevimab, and the extent to which such potential risks and benefits are unknown.  3. Information on available alternative treatments and the risks and benefits of those alternatives, including clinical trials.  4. Patients treated with bamlanivimab and casirivimab\imdevimab should continue to self-isolate and use infection control measures (e.g., wear mask, isolate, social distance, avoid sharing personal items, clean and disinfect "high touch" surfaces, and frequent handwashing) according to CDC guidelines.   5. The patient or parent/caregiver has the option to accept or refuse  bamlanivimab or casirivimab\imdevimab .  After reviewing this information with the patient, The patient agreed to proceed with receiving the bamlanimivab infusion and will be provided a copy of the Fact sheet prior to receiving the infusion.Rexene Alberts 01/06/2020 12:56 PM

## 2020-01-06 NOTE — Telephone Encounter (Signed)
Called to discuss with patient about Covid symptoms and the use of bamlanivimab, a monoclonal antibody infusion for those with mild to moderate Covid symptoms and at a high risk of hospitalization.  Pt is qualified for this infusion at the [facility] infusion center due to Age >55 and cardiovascular disease / HTN, diabetes.   She has a bad cough and headache. Thursday she noticed her energy decreasing and "head cold" symptoms. She was out of work Friday. She had contact with her son and daughter in law who tested positive.

## 2020-01-07 ENCOUNTER — Other Ambulatory Visit: Payer: BC Managed Care – PPO

## 2020-01-07 ENCOUNTER — Other Ambulatory Visit: Payer: Self-pay

## 2020-01-07 ENCOUNTER — Encounter (HOSPITAL_COMMUNITY): Payer: Self-pay

## 2020-01-07 ENCOUNTER — Emergency Department (HOSPITAL_COMMUNITY)
Admission: EM | Admit: 2020-01-07 | Discharge: 2020-01-07 | Disposition: A | Discharge status: Home / Self Care | Payer: BC Managed Care – PPO | Attending: Emergency Medicine | Admitting: Emergency Medicine

## 2020-01-07 ENCOUNTER — Emergency Department (HOSPITAL_COMMUNITY): Payer: BC Managed Care – PPO

## 2020-01-07 DIAGNOSIS — Z7984 Long term (current) use of oral hypoglycemic drugs: Secondary | ICD-10-CM | POA: Diagnosis not present

## 2020-01-07 DIAGNOSIS — Z96652 Presence of left artificial knee joint: Secondary | ICD-10-CM | POA: Diagnosis not present

## 2020-01-07 DIAGNOSIS — U071 COVID-19: Secondary | ICD-10-CM | POA: Diagnosis not present

## 2020-01-07 DIAGNOSIS — Z79899 Other long term (current) drug therapy: Secondary | ICD-10-CM | POA: Insufficient documentation

## 2020-01-07 DIAGNOSIS — R0602 Shortness of breath: Secondary | ICD-10-CM | POA: Diagnosis not present

## 2020-01-07 DIAGNOSIS — R918 Other nonspecific abnormal finding of lung field: Secondary | ICD-10-CM | POA: Diagnosis not present

## 2020-01-07 DIAGNOSIS — E119 Type 2 diabetes mellitus without complications: Secondary | ICD-10-CM | POA: Diagnosis not present

## 2020-01-07 LAB — I-STAT CHEM 8, ED
BUN: 18 mg/dL (ref 8–23)
Calcium, Ion: 1.08 mmol/L — ABNORMAL LOW (ref 1.15–1.40)
Chloride: 102 mmol/L (ref 98–111)
Creatinine, Ser: 0.6 mg/dL (ref 0.44–1.00)
Glucose, Bld: 165 mg/dL — ABNORMAL HIGH (ref 70–99)
HCT: 40 % (ref 36.0–46.0)
Hemoglobin: 13.6 g/dL (ref 12.0–15.0)
Potassium: 3.5 mmol/L (ref 3.5–5.1)
Sodium: 137 mmol/L (ref 135–145)
TCO2: 23 mmol/L (ref 22–32)

## 2020-01-07 MED ORDER — ALBUTEROL SULFATE HFA 108 (90 BASE) MCG/ACT IN AERS
2.0000 | INHALATION_SPRAY | Freq: Once | RESPIRATORY_TRACT | Status: AC
  Administered 2020-01-07: 2 via RESPIRATORY_TRACT
  Filled 2020-01-07: qty 6.7

## 2020-01-07 NOTE — ED Triage Notes (Signed)
Pt was dx with covid test yesterday. Pt reports she started getting sick on Friday. SOB , loss of appetite , cant smell. Pt reports chest pain possibly from coughing

## 2020-01-07 NOTE — ED Provider Notes (Signed)
[facility] EMERGENCY DEPARTMENT Provider Note   CSN: [id number] Arrival date & time: 01/07/20  9326     History Chief Complaint  Patient presents with  . Shortness of Breath    Dawn Weiss is a 64 y.o. female with PMHx Diabetes who presents to the ED today complaining of gradual onset, constant, unchanged, shortness of breath x 4 days.  Patient also complains of chills, body aches, chest wall pain, lack of appetite, loss of smell/taste.  She was diagnosed with COVID-19 yesterday after she had positive exposure to her children.  States that she has not taken anything over-the-counter as she "did not know what she could take."  Per chart review patient is scheduled to receive monoclonal antibodies tomorrow.  She states that she is not sure she will feel up to it as she feels so generally weak.  Patient does endorse that she has had 4-5 episodes of diarrhea in the past 2 days as well, no blood.  Denies fever, headache, vision changes, hemoptysis, leg swelling, vomiting, abdominal pain, any other associated symptoms.   The history is provided by the patient and medical records.       Past Medical History:  Diagnosis Date  . Arthritis   . Diabetes mellitus, type 2 (HCC)   . Heart murmur    at birth only  . History of pneumonia   . HNP (herniated nucleus pulposus), lumbar    of lumbosacral area  . Idiopathic cardiomyopathy (HCC)    Subsequent normalization of LVEF  . Obesity   . Pneumonia   . Syncope    Neurocardiogenic    Patient Active Problem List   Diagnosis Date Noted  . Type 2 diabetes mellitus without complications (HCC) 01/06/2020  . Herniated lumbar disc without myelopathy 11/30/2018  . Primary osteoarthritis of left knee 05/02/2015  . Vasovagal syncope 09/13/2012  . Morbid obesity (HCC) 05/31/2011  . 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 ABDOMINAL HYSTERECTOMY    . CATARACT EXTRACTION W/  INTRAOCULAR LENS  IMPLANT, BILATERAL    . DILATION AND CURETTAGE OF UTERUS    . Left knee arthroscopic surgery    . LUMBAR LAMINECTOMY/DECOMPRESSION MICRODISCECTOMY Right 11/30/2018   Procedure: Redo Right Lumbar five Sacral one Microdiscectomy;  Surgeon: Maeola Harman, MD;  Location: [facility] OR;  Service: Neurosurgery;  Laterality: Right;  . MICRODISCECTOMY LUMBAR  11/10   Right L5-S1 / 2010 and 2014  . TOTAL KNEE ARTHROPLASTY Left 05/02/2015   Procedure: LEFT TOTAL KNEE ARTHROPLASTY;  Surgeon: Samson Frederic, MD;  Location: [facility];  Service: Orthopedics;  Laterality: Left;  . TUBAL LIGATION       OB History   No obstetric history on file.     Family History  Problem Relation Age of Onset  . Hypertension Mother   . Heart disease Mother   . Diabetes Mother   . Hypercholesterolemia Mother   . Asthma Father   . Hypertension Father     Social History   Tobacco Use  . Smoking status: Never Smoker  . Smokeless tobacco: Never Used  Substance Use Topics  . Alcohol use: No    Alcohol/week: 0.0 standard drinks  . Drug use: No    Home Medications Prior to Admission medications   Medication Sig Start Date End Date Taking? Authorizing Provider  bisoprolol (ZEBETA) 5 MG tablet TAKE 1 TABLET BY MOUTH  DAILY 01/02/20   [name], MD  CINNAMON PO Take  2 capsules by mouth daily.    [provider]  gabapentin (NEURONTIN) 400 MG capsule Take 400 mg by mouth 3 (three) times daily.    [provider]  HYDROcodone-acetaminophen (NORCO) 7.5-325 MG tablet Take 1 tablet by mouth every 4 (four) hours as needed for moderate pain or severe pain ((score 7 to 10)). 12/01/18   Maeola Harman, MD  hydroxypropyl methylcellulose / hypromellose (ISOPTO TEARS / GONIOVISC) 2.5 % ophthalmic solution Place 1 drop into both eyes daily.    [provider]  ibuprofen (ADVIL,MOTRIN) 200 MG tablet Take 800 mg by mouth 2 (two) times daily as needed (for pain.).    [provider]  losartan (COZAAR) 25 MG tablet TAKE 1 TABLET BY MOUTH  DAILY 01/02/20   [name], MD  metFORMIN (GLUCOPHAGE) 500 MG tablet Take 500-1,000 mg by mouth See admin instructions. Take 2 tabs by mouth every morning & 1 tab every evening 10/18/18   [provider]  methocarbamol (ROBAXIN) 500 MG tablet Take 1 tablet (500 mg total) by mouth every 6 (six) hours as needed for muscle spasms. 12/01/18   Maeola Harman, MD  pantoprazole (PROTONIX) 40 MG tablet Take 40 mg by mouth every evening.  09/01/18   [provider]  pioglitazone (ACTOS) 15 MG tablet Take 15 mg by mouth daily.  08/24/18   [provider]  pravastatin (PRAVACHOL) 10 MG tablet Take 10 mg by mouth daily.  08/16/18   [provider]  spironolactone (ALDACTONE) 25 MG tablet TAKE 1 TABLET BY MOUTH  EVERY OTHER DAY 01/02/20   [name], MD    Allergies    Patient has no known allergies.  Review of Systems   Review of Systems  Constitutional: Positive for activity change, appetite change, chills and fatigue. Negative for fever.  HENT: Negative for congestion and sore throat.   Eyes: Negative for visual disturbance.  Respiratory: Positive for cough and shortness of breath.   Cardiovascular: Positive for chest pain (chest wall pain). Negative for palpitations and leg swelling.  Gastrointestinal: Positive for diarrhea. Negative for abdominal pain, nausea and vomiting.  Musculoskeletal: Positive for myalgias.  Neurological: Negative for headaches.  All other systems reviewed and are negative.   Physical Exam Updated Vital Signs BP 127/84 (BP Location: Left Arm)   Pulse 76   Temp 98.4 F (36.9 C) (Oral)   Resp (!) 21   Ht 5\' 5"  (1.651 m)   Wt 131.5 kg   SpO2 97%   BMI 48.26 kg/m   Physical Exam Vitals and nursing note reviewed.  Constitutional:      Appearance: She is obese. She is not ill-appearing or diaphoretic.  HENT:     Head: Normocephalic and atraumatic.  Eyes:      Conjunctiva/sclera: Conjunctivae normal.  Cardiovascular:     Rate and Rhythm: Normal rate and regular rhythm.     Pulses: Normal pulses.  Pulmonary:     Effort: Pulmonary effort is normal. No accessory muscle usage.     Breath sounds: Normal breath sounds. No decreased breath sounds, rhonchi or rales.     Comments: Able to speak in full sentences without difficulty. Satting 100% on RA. Not actively coughing in room. No accessory muscle use.  Chest:     Chest wall: Tenderness present.  Abdominal:     Palpations: Abdomen is soft.     Tenderness: There is no abdominal tenderness. There is no guarding or rebound.  Musculoskeletal:  Cervical back: Neck supple.  Skin:    General: Skin is warm and dry.  Neurological:     Mental Status: She is alert.     ED Results / Procedures / Treatments   Labs (all labs ordered are listed, but only abnormal results are displayed) Labs Reviewed  I-STAT CHEM 8, ED - Abnormal; Notable for the following components:      Result Value   Glucose, Bld 165 (*)    Calcium, Ion 1.08 (*)    All other components within normal limits    EKG EKG Interpretation  Date/Time:  Monday January 07 2020 08:28:23 EST Ventricular Rate:  79 PR Interval:    QRS Duration: 115 QT Interval:  421 QTC Calculation: 483 R Axis:   82 Text Interpretation: Sinus rhythm Nonspecific intraventricular conduction delay Low voltage, precordial leads no acute ST/T changes no PVCs, otherwise similar to 2012 Confirmed by Sherwood Gambler [phone]) on 01/07/2020 8:56:09 AM   Radiology DG Chest Port 1 View  Result Date: 01/07/2020 CLINICAL DATA:  COVID-19 positive patient. EXAM: PORTABLE CHEST 1 VIEW COMPARISON:  Apr 22, 2015 FINDINGS: Bilateral patchy infiltrates, particularly in the peripheries of the lungs are new in the interval. The cardiac silhouette appears enlarged, stable in the interval. The hila and mediastinum are normal. No pneumothorax. IMPRESSION: 1. New bilateral  pulmonary infiltrates consistent with the patient's history of COVID-19. Electronically Signed   By: Dorise Bullion III M.D   On: 01/07/2020 09:05    Procedures Procedures (including critical care time)  Medications Ordered in ED Medications  albuterol (VENTOLIN HFA) 108 (90 Base) MCG/ACT inhaler 2 puff (2 puffs Inhalation Given 01/07/20 0930)    ED Course  I have reviewed the triage vital signs and the nursing notes.  Pertinent labs & imaging results that were available during my care of the patient were reviewed by me and considered in my medical decision making (see chart for details).  64 year old female presents the ED complaining of URI symptoms, tested positive for Covid yesterday after she had positive exposure.  Has been having symptoms for about 4 to 5 days including shortness of breath, loss of appetite, cough, anosmia.  Has not been taking anything for her symptoms.  He is scheduled to have a monoclonal antibody infusion tomorrow however states that she felt so weak she is not sure if she can make it to the appointment tomorrow.  On arrival to the ED patient is afebrile, nontachycardic, nontachypneic.  She is able to speak in full sentences without difficulty.  She is satting 100% on room air.  While in the room patient is complaining of chronic issues as well however this did not bring her to the ED today.   Patient was ambulated in the ED and her oxygen saturation remained at 100% on room air.  Her EKG is nonconcerning today.  Her chest x-ray is consistent with COVID-19 pneumonia however I had lengthy discussion with patient regarding the fact that we are not treating this with antibiotics.  I have given her albuterol inhaler in the ED for symptomatic relief of her shortness of breath.  She is advised to take over-the-counter medications as needed for her body aches, fever, chills.  Advised to keep her appointment for monoclonal antibody infusion tomorrow as this may improve her  symptoms.  Patient is in agreement with plan and stable for discharge home.   This note was prepared using Dragon voice recognition software and may include unintentional dictation errors due to the  inherent limitations of voice recognition software.  Dawn Weiss was evaluated in Emergency Department on 01/07/2020 for the symptoms described in the history of present illness. She was evaluated in the context of the global COVID-19 pandemic, which necessitated consideration that the patient might be at risk for infection with the SARS-CoV-2 virus that causes COVID-19. Institutional protocols and algorithms that pertain to the evaluation of patients at risk for COVID-19 are in a state of rapid change based on information released by regulatory bodies including the CDC and federal and state organizations. These policies and algorithms were followed during the patient's care in the ED.   Clinical Course as of Jan 07 1004  Mon Jan 07, 2020  6314 Pt ambulated in the ED; O2 sats remained at 100%   [MV]  0953 Potassium: 3.5 [MV]    Clinical Course User Index [MV] Tanda Rockers, PA-C   MDM Rules/Calculators/A&P                       Final Clinical Impression(s) / ED Diagnoses Final diagnoses:  COVID-19    Rx / DC Orders ED Discharge Orders    None       Discharge Instructions     Your labwork and vitals are reassuring today. Your oxygen saturation remained at an appropriate level while walking.  It is expected that you may continue to have symptoms for an additional 10 days.  Please keep appointment for monoclonal antibody infusion as scheduled for tomorrow as this may make you feel better soon.  Use albuterol inhaler as prescribed for shortness of breath.  Continue OTC medications as needed for body aches, fevers, chills.  Remain hydrated by increasing your fluid intake. Try to eat something small even if you do not have an appetite.  Follow up with your PCP regarding your ED  visit today.        Tanda Rockers, PA-C 01/07/20 1006    Pricilla Loveless, MD 01/08/20 [phone]

## 2020-01-07 NOTE — Discharge Instructions (Signed)
Your labwork and vitals are reassuring today. Your oxygen saturation remained at an appropriate level while walking.  It is expected that you may continue to have symptoms for an additional 10 days.  Please keep appointment for monoclonal antibody infusion as scheduled for tomorrow as this may make you feel better soon.  Use albuterol inhaler as prescribed for shortness of breath.  Continue OTC medications as needed for body aches, fevers, chills.  Remain hydrated by increasing your fluid intake. Try to eat something small even if you do not have an appetite.  Follow up with your PCP regarding your ED visit today.

## 2020-01-07 NOTE — ED Notes (Signed)
Ambulated in room on RA.  sats were 99-100%.   C/o some sob, dizziness, cp. States she "felt drunk". Gait was unsteady, had to lean on wall and counter top.

## 2020-01-08 ENCOUNTER — Ambulatory Visit (HOSPITAL_COMMUNITY)
Admission: RE | Admit: 2020-01-08 | Discharge: 2020-01-08 | Disposition: A | Discharge status: Home / Self Care | Payer: BC Managed Care – PPO | Source: Ambulatory Visit | Attending: Pulmonary Disease | Admitting: Pulmonary Disease

## 2020-01-08 DIAGNOSIS — Z23 Encounter for immunization: Secondary | ICD-10-CM | POA: Diagnosis not present

## 2020-01-08 DIAGNOSIS — I429 Cardiomyopathy, unspecified: Secondary | ICD-10-CM | POA: Insufficient documentation

## 2020-01-08 DIAGNOSIS — U071 COVID-19: Secondary | ICD-10-CM | POA: Diagnosis not present

## 2020-01-08 DIAGNOSIS — I10 Essential (primary) hypertension: Secondary | ICD-10-CM | POA: Diagnosis not present

## 2020-01-08 DIAGNOSIS — E119 Type 2 diabetes mellitus without complications: Secondary | ICD-10-CM | POA: Diagnosis not present

## 2020-01-08 MED ORDER — METHYLPREDNISOLONE SODIUM SUCC 125 MG IJ SOLR: 125.0000 mg | Freq: Once | INTRAMUSCULAR | Status: DC | PRN

## 2020-01-08 MED ORDER — SODIUM CHLORIDE 0.9 % IV SOLN
700.0000 mg | Freq: Once | INTRAVENOUS | Status: AC
  Administered 2020-01-08: 700 mg via INTRAVENOUS
  Filled 2020-01-08: qty 20

## 2020-01-08 MED ORDER — DIPHENHYDRAMINE HCL 50 MG/ML IJ SOLN: 50.0000 mg | Freq: Once | INTRAMUSCULAR | Status: DC | PRN

## 2020-01-08 MED ORDER — SODIUM CHLORIDE 0.9 % IV SOLN
INTRAVENOUS | Status: DC | PRN
  Administered 2020-01-08: 250 mL via INTRAVENOUS

## 2020-01-08 MED ORDER — EPINEPHRINE 0.3 MG/0.3ML IJ SOAJ: 0.3000 mg | Freq: Once | INTRAMUSCULAR | Status: DC | PRN

## 2020-01-08 MED ORDER — ALBUTEROL SULFATE HFA 108 (90 BASE) MCG/ACT IN AERS: 2.0000 | INHALATION_SPRAY | Freq: Once | RESPIRATORY_TRACT | Status: DC | PRN

## 2020-01-08 MED ORDER — FAMOTIDINE IN NACL 20-0.9 MG/50ML-% IV SOLN: 20.0000 mg | Freq: Once | INTRAVENOUS | Status: DC | PRN

## 2020-01-08 NOTE — Discharge Instructions (Signed)

## 2020-01-08 NOTE — Progress Notes (Signed)
  Diagnosis: COVID-19  Physician: Dr. Wright  Procedure: Covid Infusion Clinic Med: bamlanivimab infusion - Provided patient with bamlanimivab fact sheet for patients, parents and caregivers prior to infusion.  Complications: No immediate complications noted.  Discharge: Discharged home   Dawn Weiss Dawn Weiss 01/08/2020   

## 2020-01-21 ENCOUNTER — Other Ambulatory Visit: Payer: BC Managed Care – PPO

## 2020-01-30 ENCOUNTER — Telehealth: Payer: Self-pay | Admitting: Cardiology

## 2020-01-30 ENCOUNTER — Telehealth: Payer: BC Managed Care – PPO | Admitting: Cardiology

## 2020-01-30 ENCOUNTER — Telehealth: Payer: Self-pay | Admitting: *Deleted

## 2020-01-30 NOTE — Telephone Encounter (Signed)
Patient called stating that she is having shortness of breath, gained 20 lbs with swelling to her feet. Recently, had COVID.  Virtual visit scheduled for 01/30/2020 with Dr. Diona Browner.

## 2020-01-30 NOTE — Telephone Encounter (Signed)
Patient missed virtual visit today due to not hearing her phone ring and missing messages left on vm. Reports swelling in both legs from knee down to toes that started yesterday. Reports worsening SOB with swelling. Reports 22 lbs weight gain in 7 days. 01/21/2020 weighed 277 lbs and 01/28/2020 299 lbs. Today weighs 299 lbs. Patient said she lost 19 lbs after being diagnosed with covid. Reports weighing on same scale, same time and same amount of clothes. Reports chest pain this morning 2/10 x's 1. Denies active chest pain. Reports dizziness when walking that is worse than before. Patient went back to work on 01/28/2020 and is currently at work. Virtual appointment reschedule with Strader on 02/01/2020 @1 :00 pm and request to be called on her work number. Advised if symptoms got worse, to go to the ED for an evaluation. Verbalized understanding of plan.

## 2020-01-31 NOTE — Progress Notes (Signed)
Virtual Visit via Telephone Note   This visit type was conducted due to national recommendations for restrictions regarding the COVID-19 Pandemic (e.g. social distancing) in an effort to limit this patient's exposure and mitigate transmission in our community.  Due to her co-morbid illnesses, this patient is at least at moderate risk for complications without adequate follow up.  This format is felt to be most appropriate for this patient at this time.  The patient did not have access to video technology/had technical difficulties with video requiring transitioning to audio format only (telephone).  All issues noted in this document were discussed and addressed.  No physical exam could be performed with this format.  Please refer to the patient's chart for her  consent to telehealth for [facility].   Date:  02/01/2020   ID:  Dawn Weiss, DOB 1956/11/12, MRN [id number]  Patient Location: Other:  Work Provider Location: Office  PCP:  Estanislado Pandy, MD  Cardiologist:  Nona Dell, MD  Electrophysiologist:  None   Evaluation Performed:  Follow-Up Visit  Chief Complaint: Dyspnea and Weight Gain   History of Present Illness:    Dawn Weiss is a 64 y.o. 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  She was last examined by Dr. Diona Browner in 08/2019 reported having back pain and neuropathy but denied any recent chest pain or palpitations. She was continued on her current medication regimen including Bisoprolol, Losartan and Aldactone with plans for a follow-up echocardiogram prior to her next visit.  By review of notes, she presented to [facility] ED on 01/07/2020 after having been diagnosed with COVID-19 the day prior and reported worsening dyspnea and body  aches.  Oxygen saturations remained appropriate and she was encouraged to follow-up with the antibody infusion clinic as scheduled for the following day.  She called the office on 01/30/2020 reporting worsening dyspnea, lower extremity edema and a 20 pound weight gain, therefore she was added on for a Virtual Visit with Dr. Diona Browner that same day but she did not answer her phone and this was rescheduled.   In talking with the patient today, she reports she became very sick with COVID-19 in 12/2019 and was mostly bedbound for over 2 weeks and lost over 17 pounds. Weight was previously around 285 lbs (at 292 lbs at the time of her office visit in 08/2019) and this declined in the 270's. This week, she reports having returned back to work and her weight has acutely increased to 300 lbs. She reports worsening dyspnea on exertion and orthopnea. Also reports lower extremity edema. She denies any associated chest pain or palpitations.   Past Medical History:  Diagnosis Date  . Arthritis   . Diabetes mellitus, type 2 (HCC)   . Heart murmur    at birth only  . History of pneumonia   . HNP (herniated nucleus pulposus), lumbar    of lumbosacral area  . Idiopathic cardiomyopathy (HCC)    a. EF 40% in 2013 b. EF 35-40% in 2018 c. 40-45% in 05/2019  . Obesity   . Pneumonia   . Syncope    Neurocardiogenic   Past Surgical History:  Procedure Laterality Date  . ABDOMINAL HYSTERECTOMY    . CATARACT EXTRACTION W/ INTRAOCULAR LENS  IMPLANT, BILATERAL    .  DILATION AND CURETTAGE OF UTERUS    . Left knee arthroscopic surgery    . LUMBAR LAMINECTOMY/DECOMPRESSION MICRODISCECTOMY Right 11/30/2018   Procedure: Redo Right Lumbar five Sacral one Microdiscectomy;  Surgeon: Maeola Harman, MD;  Location: [facility] OR;  Service: Neurosurgery;  Laterality: Right;  . MICRODISCECTOMY LUMBAR  11/10   Right L5-S1 / 2010 and 2014  . TOTAL KNEE ARTHROPLASTY Left 05/02/2015   Procedure: LEFT TOTAL KNEE ARTHROPLASTY;  Surgeon:  Samson Frederic, MD;  Location: [facility] ORS;  Service: Orthopedics;  Laterality: Left;  . TUBAL LIGATION       Current Meds  Medication Sig  . bisoprolol (ZEBETA) 5 MG tablet TAKE 1 TABLET BY MOUTH  DAILY  . CINNAMON PO Take 2 capsules by mouth daily.  Marland Kitchen gabapentin (NEURONTIN) 400 MG capsule Take 400 mg by mouth 3 (three) times daily.  Marland Kitchen HYDROcodone-acetaminophen (NORCO) 7.5-325 MG tablet Take 1 tablet by mouth every 4 (four) hours as needed for moderate pain or severe pain ((score 7 to 10)).  . hydroxypropyl methylcellulose / hypromellose (ISOPTO TEARS / GONIOVISC) 2.5 % ophthalmic solution Place 1 drop into both eyes daily.  Marland Kitchen ibuprofen (ADVIL,MOTRIN) 200 MG tablet Take 800 mg by mouth 2 (two) times daily as needed (for pain.).  Marland Kitchen losartan (COZAAR) 25 MG tablet TAKE 1 TABLET BY MOUTH  DAILY  . metFORMIN (GLUCOPHAGE) 500 MG tablet Take 500-1,000 mg by mouth See admin instructions. Take 2 tabs by mouth every morning & 1 tab every evening  . methocarbamol (ROBAXIN) 500 MG tablet Take 1 tablet (500 mg total) by mouth every 6 (six) hours as needed for muscle spasms.  . pantoprazole (PROTONIX) 40 MG tablet Take 40 mg by mouth every evening.   . pioglitazone (ACTOS) 15 MG tablet Take 15 mg by mouth daily.   . pravastatin (PRAVACHOL) 10 MG tablet Take 10 mg by mouth daily.   Marland Kitchen spironolactone (ALDACTONE) 25 MG tablet TAKE 1 TABLET BY MOUTH  EVERY OTHER DAY     Allergies:   Patient has no known allergies.   Social History   Tobacco Use  . Smoking status: Never Smoker  . Smokeless tobacco: Never Used  Substance Use Topics  . Alcohol use: No    Alcohol/week: 0.0 standard drinks  . Drug use: No     Family Hx: The patient's family history includes Asthma in her father; Diabetes in her mother; Heart disease in her mother; Hypercholesterolemia in her mother; Hypertension in her father and mother.  ROS:   Please see the history of present illness.     All other systems reviewed and are  negative.   Prior CV studies:   The following studies were reviewed today:  Echocardiogram: 05/2019 IMPRESSIONS    1. The left ventricle has mild-moderately reduced systolic function, with  an ejection fraction of 40-45%. The cavity size was normal. There is  mildly increased left ventricular wall thickness. Left ventricular  diastolic Doppler parameters are consistent  with impaired relaxation. Left ventricular diffuse hypokinesis.  2. The right ventricle has normal systolic function. The cavity was  normal. There is no increase in right ventricular wall thickness. Right  ventricular systolic pressure is normal.  3. The aortic valve is tricuspid. Mild aortic annular calcification  noted.  4. The mitral valve is grossly normal.  5. The tricuspid valve is grossly normal.  6. The aortic root is normal in size and structure.  Labs/Other Tests and Data Reviewed:    EKG:  An ECG dated 01/07/2020  was personally reviewed today and demonstrated: NSR, HR 79 with nonspecific IVCD. No acute ST abnormalities when compared to prior tracings.   Recent Labs: 01/07/2020: BUN 18; Creatinine, Ser 0.60; Hemoglobin 13.6; Potassium 3.5; Sodium 137   Recent Lipid Panel No results found for: CHOL, TRIG, HDL, CHOLHDL, LDLCALC, LDLDIRECT  Wt Readings from Last 3 Encounters:  02/01/20 300 lb (136.1 kg)  01/07/20 290 lb (131.5 kg)  08/24/19 292 lb (132.5 kg)     Objective:    Vital Signs:  BP (!) 162/87   Pulse 80   Ht 5\' 4"  (1.626 m)   Wt 300 lb (136.1 kg)   BMI 51.49 kg/m    General: Pleasant female sounding in NAD Psych: Normal affect. Neuro: Alert and oriented X 3. Lungs:  Resp regular and unlabored while talking on the phone.    ASSESSMENT & PLAN:    1. Chronic Combined Systolic and Diastolic CHF/Presumed NICM - She has a known reduced EF of 40 to 45% by echocardiogram in 05/2019 and has experienced acute weight changes over the past few weeks in the setting of COVID-19 and  now reports a 15 pound weight gain from baseline with associated dyspnea on exertion, orthopnea and lower extremity edema. - Will plan to start Lasix 40mg  daily. Recheck BMET and BNP next week. May require K+ supplementation but already on Spironolactone. Obtain a repeat echocardiogram to reassess LV function and wall motion as she would be at risk for worsening cardiomyopathy in the setting of recent COVID-19 infection.  - Continue current regimen otherwise for now with Bisoprolol 5 mg daily, Losartan 25 mg daily and Spironolactone 25 mg daily  2. HTN - BP was elevated at 162/87 on most recent check. Continue current regimen for now with initiation of Lasix as outlined above. If BP remains elevated despite diuresis, can further titrate Losartan to 50mg  daily.   3. HLD - Followed by PCP.  She remains on Pravastatin 10 mg daily.   COVID-19 Education: The signs and symptoms of COVID-19 were discussed with the patient and how to seek care for testing (follow up with PCP or arrange E-visit).  The importance of social distancing was discussed today.  Time:   Today, I have spent 26 minutes with the patient with telehealth technology discussing the above problems.     Medication Adjustments/Labs and Tests Ordered: Current medicines are reviewed at length with the patient today.  Concerns regarding medicines are outlined above.   Tests Ordered: Orders Placed This Encounter  Procedures  . B Nat Peptide  . Basic Metabolic Panel (BMET)    Medication Changes: Meds ordered this encounter  Medications  . furosemide (LASIX) 40 MG tablet    Sig: Take 1 tablet (40 mg total) by mouth daily.    Dispense:  30 tablet    Refill:  3    Order Specific Question:   Supervising Provider    Answer:   Dorothy Spark [[id number]]    Follow Up:  In Person in 3-4 weeks.   Signed, Erma Heritage, PA-C  02/01/2020 [facility]

## 2020-02-01 ENCOUNTER — Encounter: Payer: Self-pay | Admitting: Student

## 2020-02-01 ENCOUNTER — Telehealth (INDEPENDENT_AMBULATORY_CARE_PROVIDER_SITE_OTHER): Payer: BC Managed Care – PPO | Admitting: Student

## 2020-02-01 VITALS — BP 162/87 | HR 80 | Ht 64.0 in | Wt 300.0 lb

## 2020-02-01 DIAGNOSIS — E782 Mixed hyperlipidemia: Secondary | ICD-10-CM

## 2020-02-01 DIAGNOSIS — I5042 Chronic combined systolic (congestive) and diastolic (congestive) heart failure: Secondary | ICD-10-CM

## 2020-02-01 DIAGNOSIS — I10 Essential (primary) hypertension: Secondary | ICD-10-CM

## 2020-02-01 DIAGNOSIS — I428 Other cardiomyopathies: Secondary | ICD-10-CM

## 2020-02-01 MED ORDER — FUROSEMIDE 40 MG PO TABS: 40.0000 mg | ORAL_TABLET | Freq: Every day | ORAL | 3 refills | Status: DC

## 2020-02-01 NOTE — Patient Instructions (Signed)
Medication Instructions:  START Lasix 40 mg daily   *If you need a refill on your cardiac medications before your next appointment, please call your pharmacy*  Lab Work: BNP and BMET next Friday 2/19  If you have labs (blood work) drawn today and your tests are completely normal, you will receive your results only by: Marland Kitchen MyChart Message (if you have MyChart) OR . A paper copy in the mail If you have any lab test that is abnormal or we need to change your treatment, we will call you to review the results.  Testing/Procedures: Your physician has requested that you have an echocardiogram. Echocardiography is a painless test that uses sound waves to create images of your heart. It provides your doctor with information about the size and shape of your heart and how well your heart's chambers and valves are working. This procedure takes approximately one hour. There are no restrictions for this procedure.    Follow-Up: At [facility], you and your health needs are our priority.  As part of our continuing mission to provide you with exceptional heart care, we have created designated Provider Care Teams.  These Care Teams include your primary Cardiologist (physician) and Advanced Practice Providers (APPs -  Physician Assistants and Nurse Practitioners) who all work together to provide you with the care you need, when you need it.  Your next appointment:   3-4 week(s)  The format for your next appointment:   In Person  Provider:   You may see Nona Dell, MD or one of the following Advanced Practice Providers on your designated Care Team:    Randall An, PA-C          Thank you for choosing [facility] !         Other Instructions None

## 2020-02-04 ENCOUNTER — Telehealth: Payer: Self-pay | Admitting: Student

## 2020-02-04 NOTE — Telephone Encounter (Signed)
Pt called to report that she picked up fluid pill on 02/02/20. She has lost 13.8 lbs over the weekend. She States that she still has a cough, but feeling better.

## 2020-02-04 NOTE — Telephone Encounter (Signed)
Calling to give update since she started taking the fluid pills

## 2020-02-04 NOTE — Telephone Encounter (Signed)
     Thank you for the update. Good to hear! Continue with plans for labs this week and upcoming echocardiogram.   Signed, Ellsworth Lennox, PA-C 02/04/2020, 4:54 PM Pager: [phone]

## 2020-02-05 NOTE — Telephone Encounter (Signed)
Spoke with pt. She will have labs done & I informed her of echo appointment.

## 2020-02-06 ENCOUNTER — Ambulatory Visit: Payer: BC Managed Care – PPO | Admitting: Student

## 2020-02-11 DIAGNOSIS — I5042 Chronic combined systolic (congestive) and diastolic (congestive) heart failure: Secondary | ICD-10-CM | POA: Diagnosis not present

## 2020-02-11 DIAGNOSIS — I10 Essential (primary) hypertension: Secondary | ICD-10-CM | POA: Diagnosis not present

## 2020-02-11 DIAGNOSIS — I428 Other cardiomyopathies: Secondary | ICD-10-CM | POA: Diagnosis not present

## 2020-02-12 ENCOUNTER — Telehealth: Payer: Self-pay | Admitting: *Deleted

## 2020-02-12 LAB — BASIC METABOLIC PANEL
BUN/Creatinine Ratio: 27 (calc) — ABNORMAL HIGH (ref 6–22)
BUN: 27 mg/dL — ABNORMAL HIGH (ref 7–25)
CO2: 30 mmol/L (ref 20–32)
Calcium: 9.5 mg/dL (ref 8.6–10.4)
Chloride: 98 mmol/L (ref 98–110)
Creat: 0.99 mg/dL (ref 0.50–0.99)
Glucose, Bld: 177 mg/dL — ABNORMAL HIGH (ref 65–139)
Potassium: 3.9 mmol/L (ref 3.5–5.3)
Sodium: 139 mmol/L (ref 135–146)

## 2020-02-12 LAB — BRAIN NATRIURETIC PEPTIDE: Brain Natriuretic Peptide: 30 pg/mL (ref <100)

## 2020-02-12 NOTE — Telephone Encounter (Signed)
-----   Message from Ellsworth Lennox, [place] sent at 02/12/2020 10:08 AM EST ----- Please let the patient know her fluid level is within a normal range. Electrolytes and kidney function within a normal range. Glucose was elevated but this is expected given it was a non-fasting sample. If symptoms have improved and weight is back to baseline, can transition to only taking Lasix as needed for edema or weight gain. If still above baseline, would continue current dosing for now until her follow-up.

## 2020-02-12 NOTE — Telephone Encounter (Signed)
Called patient with test results. No answer. Left message to call back.  

## 2020-02-18 DIAGNOSIS — Z1231 Encounter for screening mammogram for malignant neoplasm of breast: Secondary | ICD-10-CM | POA: Diagnosis not present

## 2020-02-20 DIAGNOSIS — M4316 Spondylolisthesis, lumbar region: Secondary | ICD-10-CM | POA: Diagnosis not present

## 2020-02-20 DIAGNOSIS — M961 Postlaminectomy syndrome, not elsewhere classified: Secondary | ICD-10-CM | POA: Diagnosis not present

## 2020-02-20 DIAGNOSIS — M47816 Spondylosis without myelopathy or radiculopathy, lumbar region: Secondary | ICD-10-CM | POA: Diagnosis not present

## 2020-02-20 DIAGNOSIS — M5127 Other intervertebral disc displacement, lumbosacral region: Secondary | ICD-10-CM | POA: Diagnosis not present

## 2020-02-20 DIAGNOSIS — M5416 Radiculopathy, lumbar region: Secondary | ICD-10-CM | POA: Diagnosis not present

## 2020-02-27 ENCOUNTER — Other Ambulatory Visit: Payer: Self-pay

## 2020-02-27 ENCOUNTER — Ambulatory Visit (INDEPENDENT_AMBULATORY_CARE_PROVIDER_SITE_OTHER): Payer: BC Managed Care – PPO

## 2020-02-27 DIAGNOSIS — I428 Other cardiomyopathies: Secondary | ICD-10-CM | POA: Diagnosis not present

## 2020-02-28 ENCOUNTER — Encounter: Payer: Self-pay | Admitting: Cardiology

## 2020-02-28 ENCOUNTER — Other Ambulatory Visit: Payer: Self-pay

## 2020-02-28 ENCOUNTER — Ambulatory Visit: Payer: BC Managed Care – PPO | Admitting: Cardiology

## 2020-02-28 VITALS — BP 118/64 | HR 64 | Temp 97.3°F | Ht 64.0 in | Wt 283.0 lb

## 2020-02-28 DIAGNOSIS — Z87898 Personal history of other specified conditions: Secondary | ICD-10-CM | POA: Diagnosis not present

## 2020-02-28 DIAGNOSIS — I428 Other cardiomyopathies: Secondary | ICD-10-CM

## 2020-02-28 MED ORDER — FUROSEMIDE 40 MG PO TABS: ORAL_TABLET | ORAL | 3 refills | Status: DC

## 2020-02-28 NOTE — Progress Notes (Signed)
Cardiology Office Note  Date: 02/28/2020   ID: Dawn Weiss, DOB [birth year], MRN [id number]  PCP:  Estanislado Pandy, MD  Cardiologist:  Nona Dell, MD Electrophysiologist:  None   Chief Complaint  Patient presents with  . Cardiac follow-up    History of Present Illness: Dawn Weiss is a 64 y.o. female last assessed via telehealth encounter in mid February by Ms. Strader PA-C.  She presents for a follow-up visit.  States that she is gradually feeling better after a fairly significant bout with COVID-19 earlier in the year.  She has not been able to walk as much due to lower back pain, is currently resting out of work under the care of her orthopedist.  She does not report any orthopnea or PND.  Recent follow-up echocardiogram showed stable LVEF in the range of 40 to 45% with mild diastolic dysfunction.  I reviewed the results with her.  I reviewed her medications, cardiac regimen is stable and includes bisoprolol, Cozaar, and Aldactone.  She also has Lasix 40 mg daily.  We discussed use of an additional dose for intermittent weight gain.  Past Medical History:  Diagnosis Date  . Arthritis   . Diabetes mellitus, type 2 (HCC)   . History of heart murmur in childhood   . History of pneumonia   . HNP (herniated nucleus pulposus), lumbar   . Idiopathic cardiomyopathy (HCC)    a. EF 40% in 2013 b. EF 35-40% in 2018 c. 40-45% in 05/2019  . Obesity   . Pneumonia   . Syncope    Neurocardiogenic    Past Surgical History:  Procedure Laterality Date  . ABDOMINAL HYSTERECTOMY    . CATARACT EXTRACTION W/ INTRAOCULAR LENS  IMPLANT, BILATERAL    . DILATION AND CURETTAGE OF UTERUS    . Left knee arthroscopic surgery    . LUMBAR LAMINECTOMY/DECOMPRESSION MICRODISCECTOMY Right 11/30/2018   Procedure: Redo Right Lumbar five Sacral one Microdiscectomy;  Surgeon: Maeola Harman, MD;  Location: [facility] OR;  Service: Neurosurgery;  Laterality: Right;  . MICRODISCECTOMY LUMBAR  11/10   Right L5-S1 / 2010 and 2014  . TOTAL KNEE ARTHROPLASTY Left 05/02/2015   Procedure: LEFT TOTAL KNEE ARTHROPLASTY;  Surgeon: Samson Frederic, MD;  Location: [facility] ORS;  Service: Orthopedics;  Laterality: Left;  . TUBAL LIGATION      Current Outpatient Medications  Medication Sig Dispense Refill  . bisoprolol (ZEBETA) 5 MG tablet TAKE 1 TABLET BY MOUTH  DAILY 90 tablet 3  . CINNAMON PO Take 2 capsules by mouth daily.    . furosemide (LASIX) 40 MG tablet Take 40 mg daily, may take an extra 40 mg if weight gain is over 2-3 lbs in 24 hrs or 5 lbs in 1 week 110 tablet 3  . gabapentin (NEURONTIN) 400 MG capsule Take 400 mg by mouth 3 (three) times daily.    Marland Kitchen HYDROcodone-acetaminophen (NORCO) 7.5-325 MG tablet Take 1 tablet by mouth every 4 (four) hours as needed for moderate pain or severe pain ((score 7 to 10)). 60 tablet 0  . hydroxypropyl methylcellulose / hypromellose (ISOPTO TEARS / GONIOVISC) 2.5 % ophthalmic solution Place 1 drop into both eyes daily.    Marland Kitchen ibuprofen (ADVIL,MOTRIN) 200 MG tablet Take 800 mg by mouth 2 (two) times daily as needed (for pain.).    Marland Kitchen losartan (COZAAR) 25 MG tablet TAKE 1 TABLET BY MOUTH  DAILY 90 tablet 3  . metFORMIN (GLUCOPHAGE) 500 MG tablet Take 500-1,000 mg by mouth See  admin instructions. Take 2 tabs by mouth every morning & 1 tab every evening    . methocarbamol (ROBAXIN) 500 MG tablet Take 1 tablet (500 mg total) by mouth every 6 (six) hours as needed for muscle spasms. 60 tablet 1  . pantoprazole (PROTONIX) 40 MG tablet Take 40 mg by mouth every evening.     . pioglitazone (ACTOS) 15 MG tablet Take 15 mg by mouth daily.     . pravastatin (PRAVACHOL) 10 MG tablet Take 10 mg by mouth daily.     Marland Kitchen spironolactone (ALDACTONE) 25 MG tablet TAKE 1 TABLET BY MOUTH  EVERY OTHER DAY 45 tablet 3   No current facility-administered medications for this visit.   Allergies:  Patient has no known allergies.   ROS:   Back pain.  Physical Exam: VS:  BP 118/64   Pulse 64    Temp (!) 97.3 F (36.3 C)   Ht 5\' 4"  (1.626 m)   Wt 283 lb (128.4 kg)   SpO2 98%   BMI 48.58 kg/m , BMI Body mass index is 48.58 kg/m.  Wt Readings from Last 3 Encounters:  02/28/20 283 lb (128.4 kg)  02/01/20 300 lb (136.1 kg)  01/07/20 290 lb (131.5 kg)    General: Patient appears comfortable at rest. HEENT: Conjunctiva and lids normal, wearing a mask. Neck: Supple, no elevated JVP or carotid bruits, no thyromegaly. Lungs: Clear to auscultation, nonlabored breathing at rest. Cardiac: Regular rate and rhythm, no S3 or significant systolic murmur. Extremities: Mild lower leg edema.  ECG:  An ECG dated 01/07/2020 was personally reviewed today and demonstrated:  Sinus rhythm with IVCD.  Recent Labwork: 01/07/2020: Hemoglobin 13.6 02/11/2020: Brain Natriuretic Peptide 30; BUN 27; Creat 0.99; Potassium 3.9; Sodium 139   Other Studies Reviewed Today:  Echocardiogram 02/27/2020: 1. Left ventricular ejection fraction, by estimation, is 40 to 45%. The  left ventricle has mildly decreased function. The left ventricle  demonstrates global hypokinesis. Left ventricular diastolic parameters are  consistent with Grade I diastolic  dysfunction (impaired relaxation).  2. Right ventricular systolic function is normal. The right ventricular  size is normal. There is normal pulmonary artery systolic pressure.  3. The mitral valve is normal in structure. Trivial mitral valve  regurgitation. No evidence of mitral stenosis.  4. The aortic valve is tricuspid. Aortic valve regurgitation is not  visualized. No aortic stenosis is present.   Assessment and Plan:  1.  Nonischemic cardiomyopathy with LVEF 40 to 45%, stable by recent follow-up echocardiogram.  Reassuring in light of significant illness with COVID-19 earlier in the year.  We discussed trying to increase her activity as the weather improves and her back pain subsides.  Continue bisoprolol, losartan, and Aldactone.  Continue Lasix,  can take an extra dose for weight gain of 2 to 3 pounds in 24 hours or 5 pounds in 1 week.  2.  History of vasovagal syncope, no recurrences.  Medication Adjustments/Labs and Tests Ordered: Current medicines are reviewed at length with the patient today.  Concerns regarding medicines are outlined above.   Tests Ordered: No orders of the defined types were placed in this encounter.   Medication Changes: Meds ordered this encounter  Medications  . furosemide (LASIX) 40 MG tablet    Sig: Take 40 mg daily, may take an extra 40 mg if weight gain is over 2-3 lbs in 24 hrs or 5 lbs in 1 week    Dispense:  110 tablet    Refill:  3  02/28/20 dose adjustment    Disposition:  Follow up 6 months in the [place] office.  Signed, [name], MD, [facility] 02/28/2020 1:53 PM    [facility] at [facility] [address] [place], [place] [address] Phone: [phone]; Fax: [phone]

## 2020-02-28 NOTE — Patient Instructions (Signed)
Medication Instructions:  You may take an extra Lasix 40 mg daily if you gain more than 2-3 lbs in 24 hours, or, 5 lbs in 1 week.   *If you need a refill on your cardiac medications before your next appointment, please call your pharmacy*   Lab Work: None today If you have labs (blood work) drawn today and your tests are completely normal, you will receive your results only by: Marland Kitchen MyChart Message (if you have MyChart) OR . A paper copy in the mail If you have any lab test that is abnormal or we need to change your treatment, we will call you to review the results.   Testing/Procedures: None today   Follow-Up: At [facility] you and your health needs are our priority.  As part of our continuing mission to provide you with exceptional heart care, we have created designated Provider Care Teams.  These Care Teams include your primary Cardiologist (physician) and Advanced Practice Providers (APPs -  Physician Assistants and Nurse Practitioners) who all work together to provide you with the care you need, when you need it.  We recommend signing up for the patient portal called "MyChart".  Sign up information is provided on this After Visit Summary.  MyChart is used to connect with patients for Virtual Visits (Telemedicine).  Patients are able to view lab/test results, encounter notes, upcoming appointments, etc.  Non-urgent messages can be sent to your provider as well.   To learn more about what you can do with MyChart, go to [email or web address].    Your next appointment:   6 month(s)  The format for your next appointment:   In Person  Provider:   Nona Dell, MD   Other Instructions None      Thank you for choosing [facility] !

## 2020-03-03 ENCOUNTER — Other Ambulatory Visit: Payer: Self-pay | Admitting: Neurosurgery

## 2020-03-03 DIAGNOSIS — M4316 Spondylolisthesis, lumbar region: Secondary | ICD-10-CM

## 2020-03-05 ENCOUNTER — Other Ambulatory Visit (HOSPITAL_COMMUNITY): Payer: Self-pay | Admitting: Neurosurgery

## 2020-03-05 DIAGNOSIS — R921 Mammographic calcification found on diagnostic imaging of breast: Secondary | ICD-10-CM | POA: Diagnosis not present

## 2020-03-05 DIAGNOSIS — M4316 Spondylolisthesis, lumbar region: Secondary | ICD-10-CM

## 2020-03-06 ENCOUNTER — Other Ambulatory Visit: Payer: Self-pay | Admitting: Family Medicine

## 2020-03-06 DIAGNOSIS — R921 Mammographic calcification found on diagnostic imaging of breast: Secondary | ICD-10-CM

## 2020-03-12 ENCOUNTER — Other Ambulatory Visit: Payer: BC Managed Care – PPO

## 2020-03-14 ENCOUNTER — Other Ambulatory Visit (HOSPITAL_COMMUNITY): Payer: BC Managed Care – PPO

## 2020-03-19 ENCOUNTER — Ambulatory Visit
Admission: RE | Admit: 2020-03-19 | Discharge: 2020-03-19 | Disposition: A | Discharge status: Home / Self Care | Payer: BC Managed Care – PPO | Source: Ambulatory Visit | Attending: Family Medicine | Admitting: Family Medicine

## 2020-03-19 ENCOUNTER — Other Ambulatory Visit: Payer: Self-pay

## 2020-03-19 DIAGNOSIS — R921 Mammographic calcification found on diagnostic imaging of breast: Secondary | ICD-10-CM | POA: Diagnosis not present

## 2020-03-19 DIAGNOSIS — N6011 Diffuse cystic mastopathy of right breast: Secondary | ICD-10-CM | POA: Diagnosis not present

## 2020-03-20 ENCOUNTER — Other Ambulatory Visit: Payer: Self-pay

## 2020-03-20 ENCOUNTER — Ambulatory Visit (HOSPITAL_COMMUNITY)
Admission: RE | Admit: 2020-03-20 | Discharge: 2020-03-20 | Disposition: A | Discharge status: Home / Self Care | Payer: BC Managed Care – PPO | Source: Ambulatory Visit | Attending: Neurosurgery | Admitting: Neurosurgery

## 2020-03-20 DIAGNOSIS — M4316 Spondylolisthesis, lumbar region: Secondary | ICD-10-CM | POA: Insufficient documentation

## 2020-03-20 DIAGNOSIS — M5126 Other intervertebral disc displacement, lumbar region: Secondary | ICD-10-CM | POA: Diagnosis not present

## 2020-03-20 MED ORDER — GADOBUTROL 1 MMOL/ML IV SOLN
10.0000 mL | Freq: Once | INTRAVENOUS | Status: AC | PRN
  Administered 2020-03-20: 10 mL via INTRAVENOUS

## 2020-03-29 ENCOUNTER — Other Ambulatory Visit: Payer: BC Managed Care – PPO

## 2020-04-02 DIAGNOSIS — M545 Low back pain: Secondary | ICD-10-CM | POA: Diagnosis not present

## 2020-04-02 DIAGNOSIS — M47816 Spondylosis without myelopathy or radiculopathy, lumbar region: Secondary | ICD-10-CM | POA: Diagnosis not present

## 2020-04-02 DIAGNOSIS — M5416 Radiculopathy, lumbar region: Secondary | ICD-10-CM | POA: Diagnosis not present

## 2020-04-02 DIAGNOSIS — M961 Postlaminectomy syndrome, not elsewhere classified: Secondary | ICD-10-CM | POA: Diagnosis not present

## 2020-04-07 DIAGNOSIS — Z23 Encounter for immunization: Secondary | ICD-10-CM | POA: Diagnosis not present

## 2020-04-11 DIAGNOSIS — Z6841 Body Mass Index (BMI) 40.0 and over, adult: Secondary | ICD-10-CM | POA: Diagnosis not present

## 2020-04-11 DIAGNOSIS — Z96652 Presence of left artificial knee joint: Secondary | ICD-10-CM | POA: Diagnosis not present

## 2020-04-11 DIAGNOSIS — M25561 Pain in right knee: Secondary | ICD-10-CM | POA: Diagnosis not present

## 2020-04-14 ENCOUNTER — Telehealth: Payer: Self-pay | Admitting: *Deleted

## 2020-04-14 ENCOUNTER — Ambulatory Visit: Payer: Self-pay | Admitting: Surgery

## 2020-04-14 DIAGNOSIS — N6091 Unspecified benign mammary dysplasia of right breast: Secondary | ICD-10-CM

## 2020-04-14 NOTE — H&P (Signed)
[facility] Appointment: 04/14/2020 1:50 PM Location: [facility] Patient #: [id number] DOB: [birth year] Married / Language: English / Race: White Female  History of Present Illness Dawn Weiss A. Janmarie Smoot MD; 04/14/2020 2:36 PM) Patient words: The patient presents with request of the [facility] [place] due to abnormal screening mammogram. She was noted to have 2 clusters of calcifications in the right breast in the upper outer quadrant. Biopsies were done showed an area over each of ductal hyperplasia and a second area of fibrocystic change. She had a maternal aunt with breast cancer. No other family history of breast cancer. This is her first biopsy. She had no complaints. Patient denies mass, discharge or change in either breast.           stereotactic biopsies of the right breast was performed to sample 2 groups of calcifications in the lower outer quadrant, separated by approximately 2.5 cm.  EXAM: DIAGNOSTIC RIGHT MAMMOGRAM POST STEREOTACTIC BIOPSY x 2  COMPARISON: Previous exam(s).  FINDINGS: Mammographic images were obtained following 2 stereotactic guided biopsies of the right breast. The coil shaped biopsy clip is satisfactorily positioned along the anterolateral aspect of the more laterally and inferiorly positioned calcifications in the lower outer quadrant (site #1).  The X shaped biopsy clip placed at biopsy site #2 migrated medially approximately 3.0 cm. The calcifications biopsied at site 2 are more superior and medial than the group of calcifications at site # 1. No residual calcifications at site #2  are visualized on the post clip mammogram.  IMPRESSION: Appropriate positioning of the coil shaped biopsy marking clip at the site #1 in the lower outer quadrant of the right breast.  Approximate 3.0 cm medial migration of the X shaped biopsy clip at biopsy site #2.  Final Assessment: Post Procedure Mammograms for Marker  Placement   Electronically Signed By: Curlene Dolphin M.D. On: 03/19/2020 10:29        Diagnosis 1. Breast, right, needle core biopsy, lower outer, more lateral (coil) - ATYPICAL DUCTAL HYPERPLASIA. SEE NOTE - BACKGROUND FIBROCYSTIC CHANGE WITH ADENOSIS AND CALCIFICATIONS 2. Breast, right, needle core biopsy, lower outer, more medial, (x clip) - FIBROCYSTIC CHANGE WITH ADENOSIS AND CALCIFICATIONS - NEGATIVE FOR CARCINOMA.  The patient is a 64 year old female.   Past Surgical History Dawn Weiss, CMA; 04/14/2020 1:40 PM) Breast Biopsy Right. Cataract Surgery Bilateral. Hysterectomy (not due to cancer) - Partial Knee Surgery Left. Spinal Surgery - Lower Back  Diagnostic Studies History Dawn Weiss, CMA; 04/14/2020 1:40 PM) Colonoscopy never Mammogram within last year Pap Smear 1-5 years ago  Allergies Dawn Weiss, CMA; 04/14/2020 1:40 PM) No Known Drug Allergies [04/14/2020]:  Medication History Dawn Weiss, CMA; 04/14/2020 1:41 PM) Methocarbamol (500MG  Tablet, Oral) Active. HYDROcodone-Acetaminophen (5-325MG  Tablet, Oral) Active. Pantoprazole Sodium (40MG  Tablet DR, Oral) Active. Pravastatin Sodium (10MG  Tablet, Oral) Active. metFORMIN HCl (500MG  Tablet, Oral) Active. Spironolactone (25MG  Tablet, Oral) Active. Losartan Potassium (25MG  Tablet, Oral) Active. Bisoprolol Fumarate (5MG  Tablet, Oral) Active. Gabapentin (400MG  Capsule, Oral) Active. Pioglitazone HCl (15MG  Tablet, Oral) Active. Medications Reconciled  Social History Dawn Weiss, CMA; 04/14/2020 1:40 PM) Caffeine use Carbonated beverages, Coffee, Tea. No alcohol use No drug use Tobacco use Never smoker.  Family History Dawn Weiss, CMA; 04/14/2020 1:40 PM) Diabetes Mellitus Mother. Family history unknown First Degree Relatives Hypertension Mother. Kidney Disease Mother.  Pregnancy / Birth History Dawn Weiss, CMA;  04/14/2020 1:40 PM) Age at menarche 76 years. Gravida 3 Maternal age 63-30 Para 2  Other Problems Dawn Weiss, CMA; 04/14/2020 1:40 PM) Arthritis Diabetes Mellitus     Review of Systems [facility] R. Weiss CMA; 04/14/2020 1:40 PM) General Present- Weight Gain. Not Present- Appetite Loss, Chills, Fatigue, Fever, Night Sweats and Weight Loss. Skin Not Present- Change in Wart/Mole, Dryness, Hives, Jaundice, New Lesions, Non-Healing Wounds, Rash and Ulcer. HEENT Not Present- Earache, Hearing Loss, Hoarseness, Nose Bleed, Oral Ulcers, Ringing in the Ears, Seasonal Allergies, Sinus Pain, Sore Throat, Visual Disturbances, Wears glasses/contact lenses and Yellow Eyes. Respiratory Not Present- Bloody sputum, Chronic Cough, Difficulty Breathing, Snoring and Wheezing. Breast Not Present- Breast Mass, Breast Pain, Nipple Discharge and Skin Changes. Cardiovascular Not Present- Chest Pain, Difficulty Breathing Lying Down, Leg Cramps, Palpitations, Rapid Heart Rate, Shortness of Breath and Swelling of Extremities. Gastrointestinal Not Present- Abdominal Pain, Bloating, Bloody Stool, Change in Bowel Habits, Chronic diarrhea, Constipation, Difficulty Swallowing, Excessive gas, Gets full quickly at meals, Hemorrhoids, Indigestion, Nausea, Rectal Pain and Vomiting. Female Genitourinary Not Present- Frequency, Nocturia, Painful Urination, Pelvic Pain and Urgency. Musculoskeletal Present- Back Pain. Not Present- Joint Pain, Joint Stiffness, Muscle Pain, Muscle Weakness and Swelling of Extremities. Neurological Not Present- Decreased Memory, Fainting, Headaches, Numbness, Seizures, Tingling, Tremor, Trouble walking and Weakness. Psychiatric Not Present- Anxiety, Bipolar, Change in Sleep Pattern, Depression, Fearful and Frequent crying. Endocrine Not Present- Cold Intolerance, Excessive Hunger, Hair Changes, Heat Intolerance, Hot flashes and New Diabetes. Hematology Not Present- Blood Thinners, Easy  Bruising, Excessive bleeding, Gland problems, HIV and Persistent Infections.  Vitals [employer] R. Weiss CMA; 04/14/2020 1:40 PM) 04/14/2020 1:39 PM Weight: 278.5 lb Height: 64in Body Surface Area: 2.25 m Body Mass Index: 47.8 kg/m  Pulse: 97 (Regular)  BP: 124/80(Sitting, Left Arm, Standard)        Physical Exam (Anastasha Ortez A. Tracy Kinner MD; 04/14/2020 2:37 PM)  General Mental Status-Alert. General Appearance-Consistent with stated age. Hydration-Well hydrated. Voice-Normal.  Eye Eyeball - Bilateral-Extraocular movements intact. Sclera/Conjunctiva - Bilateral-No scleral icterus.  Chest and Lung Exam Chest and lung exam reveals -quiet, even and easy respiratory effort with no use of accessory muscles and on auscultation, normal breath sounds, no adventitious sounds and normal vocal resonance. Inspection Chest Wall - Normal. Back - normal.  Breast Breast - Left-Symmetric, Non Tender, No Biopsy scars, no Dimpling - Left, No Inflammation, No Lumpectomy scars, No Mastectomy scars, No Peau d' Orange. Breast - Right-Symmetric, Non Tender, No Biopsy scars, no Dimpling - Right, No Inflammation, No Lumpectomy scars, No Mastectomy scars, No Peau d' Orange. Breast Lump-No Palpable Breast Mass.  Cardiovascular Cardiovascular examination reveals -normal heart sounds, regular rate and rhythm with no murmurs and normal pedal pulses bilaterally.  Neurologic Neurologic evaluation reveals -alert and oriented x 3 with no impairment of recent or remote memory. Mental Status-Normal.  Lymphatic Head & Neck  General Head & Neck Lymphatics: Bilateral - Description - Normal. Axillary  General Axillary Region: Bilateral - Description - Normal. Tenderness - Non Tender.    Assessment & Plan (Shontel Santee A. Nimra Puccinelli MD; 04/14/2020 2:37 PM)  ATYPICAL DUCTAL HYPERPLASIA OF RIGHT BREAST (N60.91) Impression: Total time 30 minutes discussed surgery, complications, disease  process, review pathology, posterior review of mammograms and discuss future plans as well as documentation.  Patient has opted for right breast C localized lumpectomy of atypical ductal hyperplasia. She understands potential upgrade risk up to 20% to a more malignant diagnosis. Risk of lumpectomy include bleeding, infection, seroma, more surgery, use of seed/wire, wound care, cosmetic deformity and the need for other treatments, death , blood clots, death.  Pt agrees to proceed.  Current Plans You are being scheduled for surgery- Our schedulers will call you.  You should hear from our office's scheduling department within 5 working days about the location, date, and time of surgery. We try to make accommodations for patient's preferences in scheduling surgery, but sometimes the OR schedule or the surgeon's schedule prevents [place] from making those accommodations.  If you have not heard from our office [phone]) in 5 working days, call the office and ask for your surgeon's nurse.  If you have other questions about your diagnosis, plan, or surgery, call the office and ask for your surgeon's nurse.  Pt Education - [facility] Breast Biopsy HCI: discussed with patient and provided information.

## 2020-04-14 NOTE — Telephone Encounter (Signed)
   [facility] Pre-operative Risk Assessment    Request for surgical clearance:  1. What type of surgery is being performed? lumpectomy  2. When is this surgery scheduled? TBD  3. What type of clearance is required (medical clearance vs. Pharmacy clearance to hold med vs. Both)? Medical from cardiac standpoint  4. Are there any medications that need to be held prior to surgery and how long? No  5. Practice name and name of physician performing surgery? [facility] Dr. Brantley Stage  6. What is your office phone number [phone]   7.   What is your office fax number [email or web address]. [place], [place]  8.   Anesthesia type (None, local, MAC, general) ? general   Marlou Sa 04/14/2020, 3:38 PM  _________________________________________________________________

## 2020-04-15 NOTE — Telephone Encounter (Signed)
Left voice mail to call back 

## 2020-04-16 NOTE — Telephone Encounter (Signed)
Noted.  Patient has a suspected nonischemic cardiomyopathy and has been clinically stable on current regimen.  As you mention, she does not describe any obvious angina symptoms.  RCRI cardiac risk is class II at 0.9% chance of major adverse cardiac event.  I do not feel strongly that a ischemic study such as a Lexiscan Myoview would add much information to the preoperative risk screening, and therefore she should be able to proceed.

## 2020-04-16 NOTE — Telephone Encounter (Signed)
   Primary Cardiologist: Nona Dell, MD  Chart reviewed as part of pre-operative protocol coverage. Patient was contacted 04/16/2020 in reference to pre-operative risk assessment for pending surgery as outlined below.  Dawn Weiss was last seen on 02/28/20 by Dr. Diona Browner. She has history of presumed NICM, DM, HNP, arthritis, obesity, neurocardiogenic syncope. I do not see prior ischemic assessment; cardiomyopathy is listed as presumed nonischemic. Last EF 40-45%, grade 1 DD.  Per last few notes, patient is not really able to ambulate well due to orthopedic issues - has chronic back and knee problems. I spoke with patient today. She does report this is stable and she has not experienced any new chest pain or SOB with her lower level activity. She has also had prior back surgery before without adverse event. She is not on any blood thinners to hold.   Given that procedure is under general anesthesia and she is not able to fully exert herself due to orthopedic issues, will route to Dr. Diona Browner on whether he feels patient would need an ischemic assessment or OK to clear based on clinical stability. Dr. Diona Browner - Please route response to P CV DIV PREOP (the pre-op pool). Thank you.   [name], PA-C 04/16/2020, 11:48 AM

## 2020-04-16 NOTE — Telephone Encounter (Signed)
   Primary Cardiologist: Nona Dell, MD  Chart revisited as part of pre-operative protocol coverage. As outlined below, given past medical history and time since last visit, based on ACC/AHA guidelines, Dawn Weiss would be at acceptable risk for the planned procedure without further cardiovascular testing.   I will route this recommendation to the requesting party via Epic fax function and remove from pre-op pool. Please call with questions.  I also told patient that our office would let her know if she needed any additional cardiac testing. Callback, please let her know she does not, and Dr. Diona Browner has cleared her for the procedure. We wish her a swift recovery. Thank you!  [name], PA-C 04/16/2020, 1:54 PM

## 2020-04-16 NOTE — Telephone Encounter (Signed)
Left a message for the pt per Dr. Diona Browner she will not need any further cardiac testing in regards to pre op clearance. Dr> Diona Browner has cleared the pt for her surgery. We have faxed clearance notes to surgeon Dr. Luisa Hart with [facility]. I will remove from the pre op call back pool.

## 2020-04-16 NOTE — Telephone Encounter (Signed)
Follow up  Pt returning call regarding clearance

## 2020-04-25 ENCOUNTER — Other Ambulatory Visit: Payer: Self-pay | Admitting: Surgery

## 2020-04-25 DIAGNOSIS — N6091 Unspecified benign mammary dysplasia of right breast: Secondary | ICD-10-CM

## 2020-04-27 ENCOUNTER — Other Ambulatory Visit: Payer: Self-pay | Admitting: Student

## 2020-05-05 DIAGNOSIS — Z23 Encounter for immunization: Secondary | ICD-10-CM | POA: Diagnosis not present

## 2020-05-12 DIAGNOSIS — G629 Polyneuropathy, unspecified: Secondary | ICD-10-CM | POA: Diagnosis not present

## 2020-05-12 DIAGNOSIS — Z1389 Encounter for screening for other disorder: Secondary | ICD-10-CM | POA: Diagnosis not present

## 2020-05-12 DIAGNOSIS — R42 Dizziness and giddiness: Secondary | ICD-10-CM | POA: Diagnosis not present

## 2020-05-12 DIAGNOSIS — E1140 Type 2 diabetes mellitus with diabetic neuropathy, unspecified: Secondary | ICD-10-CM | POA: Diagnosis not present

## 2020-05-12 DIAGNOSIS — Z1331 Encounter for screening for depression: Secondary | ICD-10-CM | POA: Diagnosis not present

## 2020-05-12 DIAGNOSIS — I43 Cardiomyopathy in diseases classified elsewhere: Secondary | ICD-10-CM | POA: Diagnosis not present

## 2020-05-12 DIAGNOSIS — E1165 Type 2 diabetes mellitus with hyperglycemia: Secondary | ICD-10-CM | POA: Diagnosis not present

## 2020-06-04 ENCOUNTER — Encounter (HOSPITAL_BASED_OUTPATIENT_CLINIC_OR_DEPARTMENT_OTHER): Payer: Self-pay | Admitting: Surgery

## 2020-06-04 ENCOUNTER — Other Ambulatory Visit: Payer: Self-pay

## 2020-06-04 NOTE — Progress Notes (Signed)
Chart reviewed with Dr Hyacinth Meeker, OK for [facility] pending anesthesia consult for airway and wt check.

## 2020-06-06 DIAGNOSIS — Z23 Encounter for immunization: Secondary | ICD-10-CM | POA: Diagnosis not present

## 2020-06-07 ENCOUNTER — Other Ambulatory Visit (HOSPITAL_COMMUNITY)
Admission: RE | Admit: 2020-06-07 | Discharge: 2020-06-07 | Disposition: A | Discharge status: Home / Self Care | Payer: BC Managed Care – PPO | Source: Ambulatory Visit | Attending: Surgery | Admitting: Surgery

## 2020-06-07 DIAGNOSIS — Z01812 Encounter for preprocedural laboratory examination: Secondary | ICD-10-CM | POA: Diagnosis not present

## 2020-06-07 DIAGNOSIS — Z20822 Contact with and (suspected) exposure to covid-19: Secondary | ICD-10-CM | POA: Insufficient documentation

## 2020-06-07 LAB — SARS CORONAVIRUS 2 (TAT 6-24 HRS): SARS Coronavirus 2: NEGATIVE

## 2020-06-09 ENCOUNTER — Encounter (HOSPITAL_BASED_OUTPATIENT_CLINIC_OR_DEPARTMENT_OTHER)
Admission: RE | Admit: 2020-06-09 | Discharge: 2020-06-09 | Disposition: A | Discharge status: Home / Self Care | Payer: BC Managed Care – PPO | Source: Ambulatory Visit | Attending: Surgery | Admitting: Surgery

## 2020-06-09 DIAGNOSIS — M1990 Unspecified osteoarthritis, unspecified site: Secondary | ICD-10-CM | POA: Diagnosis not present

## 2020-06-09 DIAGNOSIS — Z79899 Other long term (current) drug therapy: Secondary | ICD-10-CM | POA: Diagnosis not present

## 2020-06-09 DIAGNOSIS — Z803 Family history of malignant neoplasm of breast: Secondary | ICD-10-CM | POA: Diagnosis not present

## 2020-06-09 DIAGNOSIS — E119 Type 2 diabetes mellitus without complications: Secondary | ICD-10-CM | POA: Diagnosis not present

## 2020-06-09 DIAGNOSIS — Z6841 Body Mass Index (BMI) 40.0 and over, adult: Secondary | ICD-10-CM | POA: Diagnosis not present

## 2020-06-09 DIAGNOSIS — N6091 Unspecified benign mammary dysplasia of right breast: Secondary | ICD-10-CM | POA: Diagnosis not present

## 2020-06-09 DIAGNOSIS — Z7984 Long term (current) use of oral hypoglycemic drugs: Secondary | ICD-10-CM | POA: Diagnosis not present

## 2020-06-09 LAB — COMPREHENSIVE METABOLIC PANEL
ALT: 13 U/L (ref 0–44)
AST: 13 U/L — ABNORMAL LOW (ref 15–41)
Albumin: 3.6 g/dL (ref 3.5–5.0)
Alkaline Phosphatase: 73 U/L (ref 38–126)
Anion gap: 10 (ref 5–15)
BUN: 17 mg/dL (ref 8–23)
CO2: 24 mmol/L (ref 22–32)
Calcium: 9.3 mg/dL (ref 8.9–10.3)
Chloride: 106 mmol/L (ref 98–111)
Creatinine, Ser: 0.75 mg/dL (ref 0.44–1.00)
GFR calc Af Amer: >60 mL/min (ref >60)
GFR calc non Af Amer: >60 mL/min (ref >60)
Glucose, Bld: 152 mg/dL — ABNORMAL HIGH (ref 70–99)
Potassium: 4.5 mmol/L (ref 3.5–5.1)
Sodium: 140 mmol/L (ref 135–145)
Total Bilirubin: 0.3 mg/dL (ref 0.3–1.2)
Total Protein: 6.7 g/dL (ref 6.5–8.1)

## 2020-06-09 LAB — CBC WITH DIFFERENTIAL/PLATELET
Abs Immature Granulocytes: 0.04 10*3/uL (ref 0.00–0.07)
Basophils Absolute: 0 10*3/uL (ref 0.0–0.1)
Basophils Relative: 0 %
Eosinophils Absolute: 0.3 10*3/uL (ref 0.0–0.5)
Eosinophils Relative: 4 %
HCT: 38.3 % (ref 36.0–46.0)
Hemoglobin: 12.2 g/dL (ref 12.0–15.0)
Immature Granulocytes: 1 %
Lymphocytes Relative: 20 %
Lymphs Abs: 1.6 10*3/uL (ref 0.7–4.0)
MCH: 30 pg (ref 26.0–34.0)
MCHC: 31.9 g/dL (ref 30.0–36.0)
MCV: 94.1 fL (ref 80.0–100.0)
Monocytes Absolute: 0.7 10*3/uL (ref 0.1–1.0)
Monocytes Relative: 9 %
Neutro Abs: 5.2 10*3/uL (ref 1.7–7.7)
Neutrophils Relative %: 66 %
Platelets: 192 10*3/uL (ref 150–400)
RBC: 4.07 MIL/uL (ref 3.87–5.11)
RDW: 13.4 % (ref 11.5–15.5)
WBC: 7.9 10*3/uL (ref 4.0–10.5)
nRBC: 0 % (ref 0.0–0.2)

## 2020-06-09 NOTE — Progress Notes (Signed)

## 2020-06-09 NOTE — Progress Notes (Signed)
Anesthesia consult per Dr. Turk, will proceed with surgery as scheduled.  

## 2020-06-10 ENCOUNTER — Other Ambulatory Visit: Payer: Self-pay

## 2020-06-10 ENCOUNTER — Ambulatory Visit
Admission: RE | Admit: 2020-06-10 | Discharge: 2020-06-10 | Disposition: A | Discharge status: Home / Self Care | Payer: BC Managed Care – PPO | Source: Ambulatory Visit | Attending: Surgery | Admitting: Surgery

## 2020-06-10 DIAGNOSIS — N6091 Unspecified benign mammary dysplasia of right breast: Secondary | ICD-10-CM

## 2020-06-10 DIAGNOSIS — N6089 Other benign mammary dysplasias of unspecified breast: Secondary | ICD-10-CM | POA: Diagnosis not present

## 2020-06-10 NOTE — Anesthesia Preprocedure Evaluation (Addendum)
Anesthesia Evaluation  Patient identified by MRN, date of birth, ID band Patient awake    Reviewed: Allergy & Precautions, NPO status , Patient's Chart, lab work & pertinent test results  Airway Mallampati: III  TM Distance: >3 FB Neck ROM: Full    Dental  (+) Dental Advisory Given, Teeth Intact   Pulmonary shortness of breath, pneumonia,    Pulmonary exam normal breath sounds clear to auscultation       Cardiovascular negative cardio ROS Normal cardiovascular exam Rhythm:Regular Rate:Normal  ECHO 02/2020 1. Left ventricular ejection fraction, by estimation, is 40 to 45%. The left ventricle has mildly decreased function. The left ventricle demonstrates global hypokinesis. Left ventricular diastolic parameters are consistent with Grade I diastolic dysfunction (impaired relaxation).  2. Right ventricular systolic function is normal. The right ventricular size is normal. There is normal pulmonary artery systolic pressure.  3. The mitral valve is normal in structure. Trivial mitral valve regurgitation. No evidence of mitral stenosis.  4. The aortic valve is tricuspid. Aortic valve regurgitation is not visualized. No aortic stenosis is present.    Neuro/Psych negative neurological ROS     GI/Hepatic Neg liver ROS, GERD  ,  Endo/Other  diabetes, Type 2, Oral Hypoglycemic AgentsMorbid obesity  Renal/GU negative Renal ROS     Musculoskeletal  (+) Arthritis ,   Abdominal (+) + obese,   Peds  Hematology negative hematology ROS (+)   Anesthesia Other Findings   Reproductive/Obstetrics                            Anesthesia Physical Anesthesia Plan  ASA: III  Anesthesia Plan: General   Post-op Pain Management:    Induction: Intravenous  PONV Risk Score and Plan: 3 and Ondansetron, Dexamethasone, Treatment may vary due to age or medical condition and Midazolam  Airway Management Planned:  LMA  Additional Equipment: None  Intra-op Plan:   Post-operative Plan: Extubation in OR  Informed Consent: I have reviewed the patients History and Physical, chart, labs and discussed the procedure including the risks, benefits and alternatives for the proposed anesthesia with the patient or authorized representative who has indicated his/her understanding and acceptance.     Dental advisory given  Plan Discussed with: CRNA  Anesthesia Plan Comments:         Anesthesia Quick Evaluation

## 2020-06-11 ENCOUNTER — Ambulatory Visit (HOSPITAL_BASED_OUTPATIENT_CLINIC_OR_DEPARTMENT_OTHER): Payer: BC Managed Care – PPO | Admitting: Anesthesiology

## 2020-06-11 ENCOUNTER — Ambulatory Visit
Admission: RE | Admit: 2020-06-11 | Discharge: 2020-06-11 | Disposition: A | Discharge status: Home / Self Care | Payer: BC Managed Care – PPO | Source: Ambulatory Visit | Attending: Surgery | Admitting: Surgery

## 2020-06-11 ENCOUNTER — Ambulatory Visit (HOSPITAL_BASED_OUTPATIENT_CLINIC_OR_DEPARTMENT_OTHER)
Admission: RE | Admit: 2020-06-11 | Discharge: 2020-06-11 | Disposition: A | Discharge status: Home / Self Care | Payer: BC Managed Care – PPO | Attending: Surgery | Admitting: Surgery

## 2020-06-11 ENCOUNTER — Other Ambulatory Visit: Payer: Self-pay

## 2020-06-11 ENCOUNTER — Encounter (HOSPITAL_BASED_OUTPATIENT_CLINIC_OR_DEPARTMENT_OTHER): Payer: Self-pay | Admitting: Surgery

## 2020-06-11 ENCOUNTER — Encounter (HOSPITAL_BASED_OUTPATIENT_CLINIC_OR_DEPARTMENT_OTHER)
Admission: RE | Disposition: A | Discharge status: Home / Self Care | Payer: Self-pay | Source: Home / Self Care | Attending: Surgery

## 2020-06-11 DIAGNOSIS — Z7984 Long term (current) use of oral hypoglycemic drugs: Secondary | ICD-10-CM | POA: Insufficient documentation

## 2020-06-11 DIAGNOSIS — N6081 Other benign mammary dysplasias of right breast: Secondary | ICD-10-CM | POA: Diagnosis not present

## 2020-06-11 DIAGNOSIS — M1990 Unspecified osteoarthritis, unspecified site: Secondary | ICD-10-CM | POA: Diagnosis not present

## 2020-06-11 DIAGNOSIS — Z803 Family history of malignant neoplasm of breast: Secondary | ICD-10-CM | POA: Diagnosis not present

## 2020-06-11 DIAGNOSIS — E119 Type 2 diabetes mellitus without complications: Secondary | ICD-10-CM | POA: Diagnosis not present

## 2020-06-11 DIAGNOSIS — N6091 Unspecified benign mammary dysplasia of right breast: Secondary | ICD-10-CM | POA: Diagnosis not present

## 2020-06-11 DIAGNOSIS — Z6841 Body Mass Index (BMI) 40.0 and over, adult: Secondary | ICD-10-CM | POA: Insufficient documentation

## 2020-06-11 DIAGNOSIS — Z79899 Other long term (current) drug therapy: Secondary | ICD-10-CM | POA: Diagnosis not present

## 2020-06-11 DIAGNOSIS — N6011 Diffuse cystic mastopathy of right breast: Secondary | ICD-10-CM | POA: Diagnosis not present

## 2020-06-11 DIAGNOSIS — J189 Pneumonia, unspecified organism: Secondary | ICD-10-CM | POA: Diagnosis not present

## 2020-06-11 HISTORY — DX: Unspecified benign mammary dysplasia of right breast: N60.91

## 2020-06-11 HISTORY — DX: Gastro-esophageal reflux disease without esophagitis: K21.9

## 2020-06-11 HISTORY — DX: Personal history of Methicillin resistant Staphylococcus aureus infection: Z86.14

## 2020-06-11 HISTORY — PX: BREAST LUMPECTOMY WITH RADIOACTIVE SEED LOCALIZATION: SHX6424

## 2020-06-11 LAB — GLUCOSE, CAPILLARY
Glucose-Capillary: 131 mg/dL — ABNORMAL HIGH (ref 70–99)
Glucose-Capillary: 146 mg/dL — ABNORMAL HIGH (ref 70–99)

## 2020-06-11 SURGERY — BREAST LUMPECTOMY WITH RADIOACTIVE SEED LOCALIZATION
Anesthesia: General | Site: Breast | Laterality: Right

## 2020-06-11 MED ORDER — MEPERIDINE HCL 25 MG/ML IJ SOLN: 6.2500 mg | INTRAMUSCULAR | Status: DC | PRN

## 2020-06-11 MED ORDER — CHLORHEXIDINE GLUCONATE CLOTH 2 % EX PADS: 6.0000 | MEDICATED_PAD | Freq: Once | CUTANEOUS | Status: DC

## 2020-06-11 MED ORDER — ONDANSETRON HCL 4 MG/2ML IJ SOLN
INTRAMUSCULAR | Status: AC
  Filled 2020-06-11: qty 2

## 2020-06-11 MED ORDER — MIDAZOLAM HCL 5 MG/5ML IJ SOLN
INTRAMUSCULAR | Status: DC | PRN
  Administered 2020-06-11: 2 mg via INTRAVENOUS

## 2020-06-11 MED ORDER — CELECOXIB 200 MG PO CAPS
ORAL_CAPSULE | ORAL | Status: AC
  Filled 2020-06-11: qty 1

## 2020-06-11 MED ORDER — EPHEDRINE 5 MG/ML INJ
INTRAVENOUS | Status: AC
  Filled 2020-06-11: qty 10

## 2020-06-11 MED ORDER — EPHEDRINE SULFATE 50 MG/ML IJ SOLN
INTRAMUSCULAR | Status: DC | PRN
  Administered 2020-06-11: 10 mg via INTRAVENOUS

## 2020-06-11 MED ORDER — ACETAMINOPHEN 500 MG PO TABS
1000.0000 mg | ORAL_TABLET | ORAL | Status: AC
  Administered 2020-06-11: 1000 mg via ORAL

## 2020-06-11 MED ORDER — FENTANYL CITRATE (PF) 100 MCG/2ML IJ SOLN
INTRAMUSCULAR | Status: AC
  Filled 2020-06-11: qty 2

## 2020-06-11 MED ORDER — IBUPROFEN 800 MG PO TABS: 800.0000 mg | ORAL_TABLET | Freq: Three times a day (TID) | ORAL | 0 refills | Status: DC | PRN

## 2020-06-11 MED ORDER — MIDAZOLAM HCL 2 MG/2ML IJ SOLN
INTRAMUSCULAR | Status: AC
  Filled 2020-06-11: qty 2

## 2020-06-11 MED ORDER — PROPOFOL 10 MG/ML IV BOLUS
INTRAVENOUS | Status: DC | PRN
  Administered 2020-06-11: 50 mg via INTRAVENOUS
  Administered 2020-06-11: 150 mg via INTRAVENOUS

## 2020-06-11 MED ORDER — LACTATED RINGERS IV SOLN: INTRAVENOUS | Status: DC

## 2020-06-11 MED ORDER — DEXTROSE 5 % IV SOLN
3.0000 g | INTRAVENOUS | Status: AC
  Administered 2020-06-11: 3 g via INTRAVENOUS

## 2020-06-11 MED ORDER — FENTANYL CITRATE (PF) 100 MCG/2ML IJ SOLN
INTRAMUSCULAR | Status: DC | PRN
  Administered 2020-06-11: 100 ug via INTRAVENOUS

## 2020-06-11 MED ORDER — CEFAZOLIN SODIUM-DEXTROSE 1-4 GM/50ML-% IV SOLN
INTRAVENOUS | Status: AC
  Filled 2020-06-11: qty 50

## 2020-06-11 MED ORDER — ACETAMINOPHEN 500 MG PO TABS
ORAL_TABLET | ORAL | Status: AC
  Filled 2020-06-11: qty 2

## 2020-06-11 MED ORDER — OXYCODONE HCL 5 MG PO TABS: 5.0000 mg | ORAL_TABLET | Freq: Once | ORAL | Status: DC | PRN

## 2020-06-11 MED ORDER — HYDROMORPHONE HCL 1 MG/ML IJ SOLN: 0.2500 mg | INTRAMUSCULAR | Status: DC | PRN

## 2020-06-11 MED ORDER — CEFAZOLIN SODIUM-DEXTROSE 2-4 GM/100ML-% IV SOLN
INTRAVENOUS | Status: AC
  Filled 2020-06-11: qty 100

## 2020-06-11 MED ORDER — 0.9 % SODIUM CHLORIDE (POUR BTL) OPTIME
TOPICAL | Status: DC | PRN
  Administered 2020-06-11: 1000 mL

## 2020-06-11 MED ORDER — DEXAMETHASONE SODIUM PHOSPHATE 4 MG/ML IJ SOLN
INTRAMUSCULAR | Status: DC | PRN
  Administered 2020-06-11: 10 mg via INTRAVENOUS
  Administered 2020-06-11: 4 mg via INTRAVENOUS

## 2020-06-11 MED ORDER — LIDOCAINE 2% (20 MG/ML) 5 ML SYRINGE
INTRAMUSCULAR | Status: AC
  Filled 2020-06-11: qty 5

## 2020-06-11 MED ORDER — DEXAMETHASONE SODIUM PHOSPHATE 10 MG/ML IJ SOLN
INTRAMUSCULAR | Status: AC
  Filled 2020-06-11: qty 1

## 2020-06-11 MED ORDER — BUPIVACAINE HCL (PF) 0.5 % IJ SOLN
INTRAMUSCULAR | Status: DC | PRN
  Administered 2020-06-11: 20 mL

## 2020-06-11 MED ORDER — PROPOFOL 500 MG/50ML IV EMUL
INTRAVENOUS | Status: AC
  Filled 2020-06-11: qty 50

## 2020-06-11 MED ORDER — OXYCODONE HCL 5 MG/5ML PO SOLN: 5.0000 mg | Freq: Once | ORAL | Status: DC | PRN

## 2020-06-11 MED ORDER — HYDROCODONE-ACETAMINOPHEN 5-325 MG PO TABS
1.0000 | ORAL_TABLET | Freq: Four times a day (QID) | ORAL | 0 refills | Status: AC | PRN
Start: 2020-06-11 — End: ?

## 2020-06-11 MED ORDER — PROMETHAZINE HCL 25 MG/ML IJ SOLN: 6.2500 mg | INTRAMUSCULAR | Status: DC | PRN

## 2020-06-11 MED ORDER — CELECOXIB 200 MG PO CAPS
200.0000 mg | ORAL_CAPSULE | ORAL | Status: AC
  Administered 2020-06-11: 200 mg via ORAL

## 2020-06-11 SURGICAL SUPPLY — 49 items
APPLIER CLIP 9.375 MED OPEN (MISCELLANEOUS)
BINDER BREAST 3XL (GAUZE/BANDAGES/DRESSINGS) ×3 IMPLANT
BINDER BREAST LRG (GAUZE/BANDAGES/DRESSINGS) IMPLANT
BINDER BREAST MEDIUM (GAUZE/BANDAGES/DRESSINGS) IMPLANT
BINDER BREAST XLRG (GAUZE/BANDAGES/DRESSINGS) IMPLANT
BINDER BREAST XXLRG (GAUZE/BANDAGES/DRESSINGS) IMPLANT
BLADE SURG 15 STRL LF DISP TIS (BLADE) ×1 IMPLANT
BLADE SURG 15 STRL SS (BLADE) ×3
CANISTER SUC SOCK COL 7IN (MISCELLANEOUS) IMPLANT
CANISTER SUCT 1200ML W/VALVE (MISCELLANEOUS) IMPLANT
CHLORAPREP W/TINT 26 (MISCELLANEOUS) ×3 IMPLANT
CLIP APPLIE 9.375 MED OPEN (MISCELLANEOUS) IMPLANT
COVER BACK TABLE 60X90IN (DRAPES) ×3 IMPLANT
COVER MAYO STAND STRL (DRAPES) ×3 IMPLANT
COVER PROBE W GEL 5X96 (DRAPES) ×3 IMPLANT
COVER WAND RF STERILE (DRAPES) IMPLANT
DECANTER SPIKE VIAL GLASS SM (MISCELLANEOUS) IMPLANT
DERMABOND ADVANCED (GAUZE/BANDAGES/DRESSINGS) ×2
DERMABOND ADVANCED .7 DNX12 (GAUZE/BANDAGES/DRESSINGS) ×1 IMPLANT
DRAPE LAPAROSCOPIC ABDOMINAL (DRAPES) IMPLANT
DRAPE LAPAROTOMY 100X72 PEDS (DRAPES) ×3 IMPLANT
DRAPE UTILITY XL STRL (DRAPES) ×3 IMPLANT
DRSG PAD ABDOMINAL 8X10 ST (GAUZE/BANDAGES/DRESSINGS) ×3 IMPLANT
ELECT COATED BLADE 2.86 ST (ELECTRODE) ×3 IMPLANT
ELECT REM PT RETURN 9FT ADLT (ELECTROSURGICAL) ×3
ELECTRODE REM PT RTRN 9FT ADLT (ELECTROSURGICAL) ×1 IMPLANT
GLOVE BIOGEL PI IND STRL 8 (GLOVE) ×1 IMPLANT
GLOVE BIOGEL PI INDICATOR 8 (GLOVE) ×2
GLOVE ECLIPSE 8.0 STRL XLNG CF (GLOVE) ×3 IMPLANT
GOWN STRL REUS W/ TWL LRG LVL3 (GOWN DISPOSABLE) ×2 IMPLANT
GOWN STRL REUS W/TWL LRG LVL3 (GOWN DISPOSABLE) ×6
HEMOSTAT ARISTA ABSORB 3G PWDR (HEMOSTASIS) IMPLANT
HEMOSTAT SNOW SURGICEL 2X4 (HEMOSTASIS) IMPLANT
KIT MARKER MARGIN INK (KITS) ×3 IMPLANT
NEEDLE HYPO 25X1 1.5 SAFETY (NEEDLE) ×3 IMPLANT
NS IRRIG 1000ML POUR BTL (IV SOLUTION) ×3 IMPLANT
PENCIL SMOKE EVACUATOR (MISCELLANEOUS) ×3 IMPLANT
SET BASIN DAY SURGERY F.S. (CUSTOM PROCEDURE TRAY) ×3 IMPLANT
SLEEVE SCD COMPRESS KNEE MED (MISCELLANEOUS) ×3 IMPLANT
SPONGE LAP 4X18 RFD (DISPOSABLE) ×3 IMPLANT
SUT MNCRL AB 4-0 PS2 18 (SUTURE) ×3 IMPLANT
SUT SILK 2 0 SH (SUTURE) IMPLANT
SUT VICRYL 3-0 CR8 SH (SUTURE) ×3 IMPLANT
SYR CONTROL 10ML LL (SYRINGE) ×3 IMPLANT
TOWEL GREEN STERILE FF (TOWEL DISPOSABLE) ×3 IMPLANT
TRAY FAXITRON CT DISP (TRAY / TRAY PROCEDURE) ×3 IMPLANT
TUBE CONNECTING 20'X1/4 (TUBING)
TUBE CONNECTING 20X1/4 (TUBING) IMPLANT
YANKAUER SUCT BULB TIP NO VENT (SUCTIONS) IMPLANT

## 2020-06-11 NOTE — H&P (Signed)
[facility]  Location: [facility] Patient #: [id number] DOB: 1956-04-04 Married / Language: English / Race: White Female  History of Present Illness Patient words: The patient presents with request of the Breast [facility]tr[facility] [place] due to abnormal screening mammogram. She was noted to have 2 clusters of calcifications in the right breast in the upper outer quadrant. Biopsies were done showed an area over each of ductal hyperplasia and a second area of fibrocystic change. She had a maternal aunt with breast cancer. No other family history of breast cancer. This is her first biopsy. She had no complaints. Patient denies mass, discharge or change in either breast.           stereotactic biopsies of the right breast was performed to sample 2 groups of calcifications in the lower outer quadrant, separated by approximately 2.5 cm.  EXAM: DIAGNOSTIC RIGHT MAMMOGRAM POST STEREOTACTIC BIOPSY x 2  COMPARISON: Previous exam(s).  FINDINGS: Mammographic images were obtained following 2 stereotactic guided biopsies of the right breast. The coil shaped biopsy clip is satisfactorily positioned along the anterolateral aspect of the more laterally and inferiorly positioned calcifications in the lower outer quadrant (site #1).  The X shaped biopsy clip placed at biopsy site #2 migrated medially approximately 3.0 cm. The calcifications biopsied at site 2 are more superior and medial than the group of calcifications at site # 1. No residual calcifications at site #2  are visualized on the post clip mammogram.  IMPRESSION: Appropriate positioning of the coil shaped biopsy marking clip at the site #1 in the lower outer quadrant of the right breast.  Approximate 3.0 cm medial migration of the X shaped biopsy clip at biopsy site #2.  Final Assessment: Post Procedure Mammograms for Marker Placement   Electronically Signed By: Curlene Dolphin M.D. On:  03/19/2020 10:29        Diagnosis 1. Breast, right, needle core biopsy, lower outer, more lateral (coil) - ATYPICAL DUCTAL HYPERPLASIA. SEE NOTE - BACKGROUND FIBROCYSTIC CHANGE WITH ADENOSIS AND CALCIFICATIONS 2. Breast, right, needle core biopsy, lower outer, more medial, (x clip) - FIBROCYSTIC CHANGE WITH ADENOSIS AND CALCIFICATIONS - NEGATIVE FOR CARCINOMA.  The patient is a 64 year old female.   Past Surgical History  Breast Biopsy Right. Cataract Surgery Bilateral. Hysterectomy (not due to cancer) - Partial Knee Surgery Left. Spinal Surgery - Lower Back  Diagnostic Studies History  Colonoscopy never Mammogram within last year Pap Smear 1-5 years ago  Allergies  No Known Drug Allergies [04/14/2020]:  Medication History  Methocarbamol (500MG  Tablet, Oral) Active. HYDROcodone-Acetaminophen (5-325MG  Tablet, Oral) Active. Pantoprazole Sodium (40MG  Tablet DR, Oral) Active. Pravastatin Sodium (10MG  Tablet, Oral) Active. metFORMIN HCl (500MG  Tablet, Oral) Active. Spironolactone (25MG  Tablet, Oral) Active. Losartan Potassium (25MG  Tablet, Oral) Active. Bisoprolol Fumarate (5MG  Tablet, Oral) Active. Gabapentin (400MG  Capsule, Oral) Active. Pioglitazone HCl (15MG  Tablet, Oral) Active. Medications Reconciled  Social History  Caffeine use Carbonated beverages, Coffee, Tea. No alcohol use No drug use Tobacco use Never smoker.  Family History  Diabetes Mellitus Mother. Family history unknown First Degree Relatives Hypertension Mother. Kidney Disease Mother.  Pregnancy / Birth History  Age at menarche 34 years. Gravida 3 Maternal age 61-30 Para 2  Other Problems  Arthritis Diabetes Mellitus     Review of Systems  General Present- Weight Gain. Not Present- Appetite Loss, Chills, Fatigue, Fever, Night Sweats and Weight Loss. Skin Not Present- Change in Wart/Mole, Dryness, Hives, Jaundice, New  Lesions, Non-Healing Wounds, Rash and Ulcer. HEENT Not Present- Earache, Hearing  Loss, Hoarseness, Nose Bleed, Oral Ulcers, Ringing in the Ears, Seasonal Allergies, Sinus Pain, Sore Throat, Visual Disturbances, Wears glasses/contact lenses and Yellow Eyes. Respiratory Not Present- Bloody sputum, Chronic Cough, Difficulty Breathing, Snoring and Wheezing. Breast Not Present- Breast Mass, Breast Pain, Nipple Discharge and Skin Changes. Cardiovascular Not Present- Chest Pain, Difficulty Breathing Lying Down, Leg Cramps, Palpitations, Rapid Heart Rate, Shortness of Breath and Swelling of Extremities. Gastrointestinal Not Present- Abdominal Pain, Bloating, Bloody Stool, Change in Bowel Habits, Chronic diarrhea, Constipation, Difficulty Swallowing, Excessive gas, Gets full quickly at meals, Hemorrhoids, Indigestion, Nausea, Rectal Pain and Vomiting. Female Genitourinary Not Present- Frequency, Nocturia, Painful Urination, Pelvic Pain and Urgency. Musculoskeletal Present- Back Pain. Not Present- Joint Pain, Joint Stiffness, Muscle Pain, Muscle Weakness and Swelling of Extremities. Neurological Not Present- Decreased Memory, Fainting, Headaches, Numbness, Seizures, Tingling, Tremor, Trouble walking and Weakness. Psychiatric Not Present- Anxiety, Bipolar, Change in Sleep Pattern, Depression, Fearful and Frequent crying. Endocrine Not Present- Cold Intolerance, Excessive Hunger, Hair Changes, Heat Intolerance, Hot flashes and New Diabetes. Hematology Not Present- Blood Thinners, Easy Bruising, Excessive bleeding, Gland problems, HIV and Persistent Infections.  Vitals  04/14/2020 1:39 PM Weight: 278.5 lb Height: 64in Body Surface Area: 2.25 m Body Mass Index: 47.8 kg/m  Pulse: 97 (Regular)  BP: 124/80(Sitting, Left Arm, Standard)        Physical Exam  General Mental Status-Alert. General Appearance-Consistent with stated age. Hydration-Well  hydrated. Voice-Normal.  Eye Eyeball - Bilateral-Extraocular movements intact. Sclera/Conjunctiva - Bilateral-No scleral icterus.  Chest and Lung Exam Chest and lung exam reveals -quiet, even and easy respiratory effort with no use of accessory muscles and on auscultation, normal breath sounds, no adventitious sounds and normal vocal resonance. Inspection Chest Wall - Normal. Back - normal.  Breast Breast - Left-Symmetric, Non Tender, No Biopsy scars, no Dimpling - Left, No Inflammation, No Lumpectomy scars, No Mastectomy scars, No Peau d' Orange. Breast - Right-Symmetric, Non Tender, No Biopsy scars, no Dimpling - Right, No Inflammation, No Lumpectomy scars, No Mastectomy scars, No Peau d' Orange. Breast Lump-No Palpable Breast Mass.  Cardiovascular Cardiovascular examination reveals -normal heart sounds, regular rate and rhythm with no murmurs and normal pedal pulses bilaterally.  Neurologic Neurologic evaluation reveals -alert and oriented x 3 with no impairment of recent or remote memory. Mental Status-Normal.  Lymphatic Head & Neck  General Head & Neck Lymphatics: Bilateral - Description - Normal. Axillary  General Axillary Region: Bilateral - Description - Normal. Tenderness - Non Tender.    Assessment & Plan   ATYPICAL DUCTAL HYPERPLASIA OF RIGHT BREAST (N60.91) Impression: Total time 30 minutes discussed surgery, complications, disease process, review pathology, posterior review of mammograms and discuss future plans as well as documentation.  Patient has opted for right breast C localized lumpectomy of atypical ductal hyperplasia. She understands potential upgrade risk up to 20% to a more malignant diagnosis. Risk of lumpectomy include bleeding, infection, seroma, more surgery, use of seed/wire, wound care, cosmetic deformity and the need for other treatments, death , blood clots, death. Pt agrees to proceed.  Current Plans You are  being scheduled for surgery- Our schedulers will call you.  You should hear from our office's scheduling department within 5 working days about the location, date, and time of surgery. We try to make accommodations for patient's preferences in scheduling surgery, but sometimes the OR schedule or the surgeon's schedule prevents [place] from making those accommodations.  If you have not heard from our office [phone]) in 5 working days, call the office  and ask for your surgeon's nurse.  If you have other questions about your diagnosis, plan, or surgery, call the office and ask for your surgeon's nurse.  Pt Education - [facility] Breast Biopsy HCI: discussed with patient and provided information.

## 2020-06-11 NOTE — Op Note (Signed)
Right breast atypical ductal hyperplasia  Postoperative diagnosis: Same  Procedure: Right breast seed localized lumpectomy  Surgeon: Thomas Cornett, MD  Assistant: Puja         PA  Anesthesia: LMA with local  EBL: Minimal  Specimen: Right breast tissue with seed and clip verified by Faxitron  Drains: None  IV fluids: Per record  Indications for procedure: The patient is a 64-year-old female who on recent mammogram was found to have an area in her right breast.  She had 2 biopsies on the right breast 1 was fibrocystic change the second was atypical ductal hyperplasia.  She presents for excision of the area of atypical ductal hyperplasia due to potential upgrade risk.The procedure has been discussed with the patient. Alternatives to surgery have been discussed with the patient.  Risks of surgery include bleeding,  Infection,  Seroma formation, death,  and the need for further surgery.   The patient understands and wishes to proceed.    Description of procedure: The patient was met in the holding area and questions were answered.  The right breast was marked as the correct site and the neoprobe was used to identify the seed in the right breast lower outer quadrant.  Questions were answered.  She was taken back to the operating.  She is placed supine upon the OR table.  After induction of general stage the right breast was prepped and draped in sterile fashion timeout performed.  She received proper antibiotics.  Quarter percent Marcaine plain was used to inject the skin and subcutaneous breast tissue in the right lower outer quadrant.  Neoprobe was used to identify the seed and curvilinear incision was made over the signal.  Dissection was carried down all tissue and the seed and clip were excised with a grossly negative margin.  Hemostasis achieved.  Faxitron image revealed seed and clip to be in the specimen.  Local anesthetic was infiltrated in the cavity.  Was then closed with 3-0 Vicryl and  4 Monocryl.  Dermabond applied.  Breast binder placed.  All counts were found to be correct.  Patient awoke extubated taken to recovery in satisfactory condition.   

## 2020-06-11 NOTE — Anesthesia Postprocedure Evaluation (Signed)
Anesthesia Post Note  Patient: Dawn Weiss  Procedure(s) Performed: RADIOACTIVE SEED GUIDED RIGHT BREAST LUMPECTOMY (Right Breast)     Patient location during evaluation: PACU Anesthesia Type: General Level of consciousness: sedated and patient cooperative Pain management: pain level controlled Vital Signs Assessment: post-procedure vital signs reviewed and stable Respiratory status: spontaneous breathing Cardiovascular status: stable Anesthetic complications: no   No complications documented.  Last Vitals:  Vitals:   06/11/20 0958 06/11/20 1019  BP:  (!) 109/50  Pulse: 70 74  Resp: 16 16  Temp: 36.6 C 36.6 C  SpO2: 97% 100%    Last Pain:  Vitals:   06/11/20 1019  TempSrc:   PainSc: 0-No pain                 Lewie Loron

## 2020-06-11 NOTE — Transfer of Care (Signed)
Immediate Anesthesia Transfer of Care Note  Patient: Dawn Weiss  Procedure(s) Performed: RADIOACTIVE SEED GUIDED RIGHT BREAST LUMPECTOMY (Right Breast)  Patient Location: PACU  Anesthesia Type:General  Level of Consciousness: awake and patient cooperative  Airway & Oxygen Therapy: Patient Spontanous Breathing and Patient connected to face mask oxygen  Post-op Assessment: Report given to RN and Post -op Vital signs reviewed and stable  Post vital signs: Reviewed and stable  Last Vitals:  Vitals Value Taken Time  BP    Temp    Pulse 76 06/11/20 0907  Resp 21 06/11/20 0907  SpO2 100 % 06/11/20 0907  Vitals shown include unvalidated device data.  Last Pain:  Vitals:   06/11/20 0651  TempSrc: Oral  PainSc: 0-No pain         Complications: No complications documented.

## 2020-06-11 NOTE — Anesthesia Procedure Notes (Signed)
Procedure Name: LMA Insertion Date/Time: 06/11/2020 8:29 AM Performed by: Fromberg Desanctis, CRNA Pre-anesthesia Checklist: Patient identified, Emergency Drugs available, Suction available, Patient being monitored and Timeout performed Patient Re-evaluated:Patient Re-evaluated prior to induction Oxygen Delivery Method: Circle system utilized Preoxygenation: Pre-oxygenation with 100% oxygen Induction Type: IV induction Ventilation: Mask ventilation without difficulty LMA: LMA inserted LMA Size: 4.0 Number of attempts: 1 Airway Equipment and Method: Bite block Placement Confirmation: positive ETCO2 Tube secured with: Tape Dental Injury: Teeth and Oropharynx as per pre-operative assessment

## 2020-06-11 NOTE — Discharge Instructions (Signed)
Fibrocystic Breast Changes  Fibrocystic breast changes are changes that can make your breasts swollen or painful. These changes happen when tiny sacs of fluid (cysts) form in the breast. This is a common condition. It does not mean that you have cancer. It usually happens because of hormone changes during a monthly period. Follow these instructions at home:  Check your breasts after every monthly period. If you do not have monthly periods, check your breasts on the first day of every month. Check for: ? Soreness. ? New swelling or puffiness. ? A change in breast size. ? A change in a lump that was already there.  Take over-the-counter and prescription medicines only as told by your doctor.  Wear a support or sports bra that fits well. Wear this support especially when you are exercising.  Avoid or have less caffeine, fat, and sugar in what you eat and drink as told by your doctor. Contact a doctor if:  You have fluid coming from your nipple, especially if the fluid has blood in it.  You have new lumps or bumps in your breast.  Your breast gets puffy, red, and painful.  You have changes in how your breast looks.  Your nipple looks flat or it sinks into your breast. Get help right away if:  Your breast turns red, and the redness is spreading. Summary  Fibrocystic breast changes are changes that can make your breasts swollen or painful.  This condition can happen when you have hormone changes during your monthly period.  With this condition, it is important to check your breasts after every monthly period. If you do not have monthly periods, check your breasts on the first day of every month. This information is not intended to replace advice given to you by your health care provider. Make sure you discuss any questions you have with your health care provider. Document Revised: 03/29/2019 Document Reviewed: 08/19/2016 Elsevier Patient Education  2020 [employer]. [facility] Office Phone Number [phone]  BREAST BIOPSY/ PARTIAL MASTECTOMY: POST OP INSTRUCTIONS  Always review your discharge instruction sheet given to you by the facility where your surgery was performed.  IF YOU HAVE DISABILITY OR FAMILY LEAVE FORMS, YOU MUST BRING THEM TO THE OFFICE FOR PROCESSING.  DO NOT GIVE THEM TO YOUR DOCTOR.  1. A prescription for pain medication may be given to you upon discharge.  Take your pain medication as prescribed, if needed.  If narcotic pain medicine is not needed, then you may take acetaminophen (Tylenol) or ibuprofen (Advil) as needed. 2. Take your usually prescribed medications unless otherwise directed 3. If you need a refill on your pain medication, please contact your pharmacy.  They will contact our office to request authorization.  Prescriptions will not be filled after 5pm or on week-ends. 4. You should eat very light the first 24 hours after surgery, such as soup, crackers, pudding, etc.  Resume your normal diet the day after surgery. 5. Most patients will experience some swelling and bruising in the breast.  Ice packs and a good support bra will help.  Swelling and bruising can take several days to resolve.  6. It is common to experience some constipation if taking pain medication after surgery.  Increasing fluid intake and taking a stool softener will usually help or prevent this problem from occurring.  A mild laxative (Milk of Magnesia or Miralax) should be taken according to package directions if there are no bowel movements after 48 hours. 7. Unless  discharge instructions indicate otherwise, you may remove your bandages 24-48 hours after surgery, and you may shower at that time.  You may have steri-strips (small skin tapes) in place directly over the incision.  These strips should be left on the skin for 7-10 days.  If your surgeon used skin glue on the incision, you may shower in 24 hours.  The glue will flake off over the next 2-3  weeks.  Any sutures or staples will be removed at the office during your follow-up visit. 8. ACTIVITIES:  You may resume regular daily activities (gradually increasing) beginning the next day.  Wearing a good support bra or sports bra minimizes pain and swelling.  You may have sexual intercourse when it is comfortable. a. You may drive when you no longer are taking prescription pain medication, you can comfortably wear a seatbelt, and you can safely maneuver your car and apply brakes. b. RETURN TO WORK:  ______________________________________________________________________________________ 9. You should see your doctor in the office for a follow-up appointment approximately two weeks after your surgery.  Your doctor's nurse will typically make your follow-up appointment when she calls you with your pathology report.  Expect your pathology report 2-3 business days after your surgery.  You may call to check if you do not hear from [place] after three days. 10. OTHER INSTRUCTIONS: _______________________________________________________________________________________________ _____________________________________________________________________________________________________________________________________ _____________________________________________________________________________________________________________________________________ _____________________________________________________________________________________________________________________________________  WHEN TO CALL YOUR DOCTOR: 1. Fever over 101.0 2. Nausea and/or vomiting. 3. Extreme swelling or bruising. 4. Continued bleeding from incision. 5. Increased pain, redness, or drainage from the incision.  The clinic staff is available to answer your questions during regular business hours.  Please don't hesitate to call and ask to speak to one of the nurses for clinical concerns.  If you have a medical emergency, go to the nearest emergency  room or call 911.  A surgeon from [facility] is always on call at the hospital.  For further questions, please visit [email or web address]   Post Anesthesia Home Care Instructions  Activity: Get plenty of rest for the remainder of the day. A responsible individual must stay with you for 24 hours following the procedure.  For the next 24 hours, DO NOT: -Drive a car -Paediatric nurse -Drink alcoholic beverages -Take any medication unless instructed by your physician -Make any legal decisions or sign important papers.  Meals: Start with liquid foods such as gelatin or soup. Progress to regular foods as tolerated. Avoid greasy, spicy, heavy foods. If nausea and/or vomiting occur, drink only clear liquids until the nausea and/or vomiting subsides. Call your physician if vomiting continues.  Special Instructions/Symptoms: Your throat may feel dry or sore from the anesthesia or the breathing tube placed in your throat during surgery. If this causes discomfort, gargle with warm salt water. The discomfort should disappear within 24 hours.  If you had a scopolamine patch placed behind your ear for the management of post- operative nausea and/or vomiting:  1. The medication in the patch is effective for 72 hours, after which it should be removed.  Wrap patch in a tissue and discard in the trash. Wash hands thoroughly with soap and water. 2. You may remove the patch earlier than 72 hours if you experience unpleasant side effects which may include dry mouth, dizziness or visual disturbances. 3. Avoid touching the patch. Wash your hands with soap and water after contact with the patch.

## 2020-06-11 NOTE — Interval H&P Note (Signed)
History and Physical Interval Note:  06/11/2020 7:56 AM  Dawn Weiss  has presented today for surgery, with the diagnosis of ATYPICAL DUCTAL HYPER PLASIA.  The various methods of treatment have been discussed with the patient and family. After consideration of risks, benefits and other options for treatment, the patient has consented to  Procedure(s): RIGHT BREAST LUMPECTOMY WITH RADIOACTIVE SEED LOCALIZATION (Right) as a surgical intervention.  The patient's history has been reviewed, patient examined, no change in status, stable for surgery.  I have reviewed the patient's chart and labs.  Questions were answered to the patient's satisfaction.     Dawn Weiss

## 2020-06-12 ENCOUNTER — Encounter (HOSPITAL_BASED_OUTPATIENT_CLINIC_OR_DEPARTMENT_OTHER): Payer: Self-pay | Admitting: Surgery

## 2020-06-13 LAB — SURGICAL PATHOLOGY

## 2020-07-30 DIAGNOSIS — M961 Postlaminectomy syndrome, not elsewhere classified: Secondary | ICD-10-CM | POA: Diagnosis not present

## 2020-07-30 DIAGNOSIS — M542 Cervicalgia: Secondary | ICD-10-CM | POA: Diagnosis not present

## 2020-09-04 ENCOUNTER — Encounter: Payer: Self-pay | Admitting: Cardiology

## 2020-09-04 NOTE — Progress Notes (Signed)
Cardiology Office Note  Date: 09/05/2020   ID: Dawn Weiss, [place] 1956-10-27, MRN [id number]  PCP:  Estanislado Pandy, MD  Cardiologist:  Nona Dell, MD Electrophysiologist:  None   Chief Complaint  Patient presents with  . Cardiac follow-up    History of Present Illness: Dawn Weiss is a 64 y.o. female last seen in March.  She presents for a routine visit.  From a cardiac perspective she does not report any progressive dyspnea on exertion with typical activities, no exertional chest pain or syncope.  Follow-up echocardiogram in March revealed LVEF 40 to 45% with grade 1 diastolic dysfunction, normal RV contraction.  I reviewed her current medications which are listed below.  She does not report any intolerances.  She tells me that she is undergoing disability determinations, was taken out of work by Dr. Venetia Maxon due to progressive spine disease and chronic pain.  Past Medical History:  Diagnosis Date  . Arthritis   . Atypical ductal hyperplasia of right breast   . Diabetes mellitus, type 2 (HCC)   . GERD (gastroesophageal reflux disease)   . History of heart murmur in childhood   . History of MRSA infection    Nasal swab  . History of pneumonia   . HNP (herniated nucleus pulposus), lumbar   . Idiopathic cardiomyopathy (HCC)    a. EF 40% in 2013 b. EF 35-40% in 2018 c. 40-45% in 05/2019  . Obesity   . Pneumonia   . Syncope    Neurocardiogenic    Past Surgical History:  Procedure Laterality Date  . ABDOMINAL HYSTERECTOMY    . BREAST LUMPECTOMY WITH RADIOACTIVE SEED LOCALIZATION Right 06/11/2020   Procedure: RADIOACTIVE SEED GUIDED RIGHT BREAST LUMPECTOMY;  Surgeon: Harriette Bouillon, MD;  Location: [facility];  Service: General;  Laterality: Right;  . CATARACT EXTRACTION W/ INTRAOCULAR LENS  IMPLANT, BILATERAL    . DILATION AND CURETTAGE OF UTERUS    . Left knee arthroscopic surgery    . LUMBAR LAMINECTOMY/DECOMPRESSION MICRODISCECTOMY  Right 11/30/2018   Procedure: Redo Right Lumbar five Sacral one Microdiscectomy;  Surgeon: Maeola Harman, MD;  Location: [facility] OR;  Service: Neurosurgery;  Laterality: Right;  . MICRODISCECTOMY LUMBAR  11/10   Right L5-S1 / 2010 and 2014  . TOTAL KNEE ARTHROPLASTY Left 05/02/2015   Procedure: LEFT TOTAL KNEE ARTHROPLASTY;  Surgeon: Samson Frederic, MD;  Location: [facility] ORS;  Service: Orthopedics;  Laterality: Left;  . TUBAL LIGATION      Current Outpatient Medications  Medication Sig Dispense Refill  . bisoprolol (ZEBETA) 5 MG tablet TAKE 1 TABLET BY MOUTH  DAILY 90 tablet 3  . furosemide (LASIX) 40 MG tablet TAKE 1 TABLET BY MOUTH EVERY DAY (Patient taking differently: as needed. TAKE 1 TABLET BY MOUTH EVERY DAY) 30 tablet 3  . gabapentin (NEURONTIN) 400 MG capsule Take 400 mg by mouth 3 (three) times daily.    Marland Kitchen HYDROcodone-acetaminophen (NORCO/VICODIN) 5-325 MG tablet Take 1 tablet by mouth every 6 (six) hours as needed for moderate pain. 15 tablet 0  . losartan (COZAAR) 25 MG tablet TAKE 1 TABLET BY MOUTH  DAILY 90 tablet 3  . metFORMIN (GLUCOPHAGE) 500 MG tablet Take 500-1,000 mg by mouth See admin instructions. Take 2 tabs by mouth every morning & 1 tab every evening    . methocarbamol (ROBAXIN) 500 MG tablet Take 1 tablet (500 mg total) by mouth every 6 (six) hours as needed for muscle spasms. 60 tablet 1  . pantoprazole (  PROTONIX) 40 MG tablet Take 40 mg by mouth every evening.     . pioglitazone (ACTOS) 15 MG tablet Take 15 mg by mouth daily.     . pravastatin (PRAVACHOL) 10 MG tablet Take 10 mg by mouth daily.     Marland Kitchen spironolactone (ALDACTONE) 25 MG tablet TAKE 1 TABLET BY MOUTH  EVERY OTHER DAY 45 tablet 3   No current facility-administered medications for this visit.   Allergies:  Patient has no known allergies.   ROS:   No palpitations or syncope.  Physical Exam: VS:  BP 122/72   Pulse 70   Ht 5\' 4"  (1.626 m)   Wt 277 lb (125.6 kg)   SpO2 98%   BMI 47.55 kg/m , BMI Body mass  index is 47.55 kg/m.  Wt Readings from Last 3 Encounters:  09/05/20 277 lb (125.6 kg)  06/11/20 281 lb 4.9 oz (127.6 kg)  02/28/20 283 lb (128.4 kg)    General: Patient appears comfortable at rest. HEENT: Conjunctiva and lids normal, wearing a mask. Neck: Supple, no elevated JVP or carotid bruits, no thyromegaly. Lungs: Clear to auscultation, nonlabored breathing at rest. Cardiac: Regular rate and rhythm, no S3 or significant systolic murmur. Extremities: No pitting edema.  ECG:  An ECG dated 01/07/2020 was personally reviewed today and demonstrated:  Sinus rhythm with IVCD.  Recent Labwork: 02/11/2020: Brain Natriuretic Peptide 30 06/09/2020: ALT 13; AST 13; BUN 17; Creatinine, Ser 0.75; Hemoglobin 12.2; Platelets 192; Potassium 4.5; Sodium 140   Other Studies Reviewed Today:  Echocardiogram 02/27/2020: 1. Left ventricular ejection fraction, by estimation, is 40 to 45%. The  left ventricle has mildly decreased function. The left ventricle  demonstrates global hypokinesis. Left ventricular diastolic parameters are  consistent with Grade I diastolic  dysfunction (impaired relaxation).  2. Right ventricular systolic function is normal. The right ventricular  size is normal. There is normal pulmonary artery systolic pressure.  3. The mitral valve is normal in structure. Trivial mitral valve  regurgitation. No evidence of mitral stenosis.  4. The aortic valve is tricuspid. Aortic valve regurgitation is not  visualized. No aortic stenosis is present.   Assessment and Plan:  1.  Nonischemic cardiomyopathy with LVEF 40 to 45% and mild diastolic dysfunction, normal RV function.  She is clinically stable with NYHA class II dyspnea.  Weight is down a few pounds compared to June.  Plan to continue medical therapy and observation.  She is currently on bisoprolol, losartan, Aldactone, and Lasix.  2.  History of vasovagal syncope without significant recurrence.  Medication  Adjustments/Labs and Tests Ordered: Current medicines are reviewed at length with the patient today.  Concerns regarding medicines are outlined above.   Tests Ordered: No orders of the defined types were placed in this encounter.   Medication Changes: No orders of the defined types were placed in this encounter.   Disposition:  Follow up 6 months in the [place] office.  Signed, [place], MD, [facility] 09/05/2020 9:55 AM    [facility] [address] [place], [place], [place] Phone: [phone]; Fax: [phone]

## 2020-09-05 ENCOUNTER — Ambulatory Visit: Payer: BC Managed Care – PPO | Admitting: Cardiology

## 2020-09-05 ENCOUNTER — Other Ambulatory Visit: Payer: Self-pay

## 2020-09-05 ENCOUNTER — Encounter: Payer: Self-pay | Admitting: Cardiology

## 2020-09-05 VITALS — BP 122/72 | HR 70 | Ht 64.0 in | Wt 277.0 lb

## 2020-09-05 DIAGNOSIS — R55 Syncope and collapse: Secondary | ICD-10-CM

## 2020-09-05 DIAGNOSIS — I428 Other cardiomyopathies: Secondary | ICD-10-CM

## 2020-09-05 NOTE — Patient Instructions (Signed)

## 2020-09-11 DIAGNOSIS — Z13220 Encounter for screening for lipoid disorders: Secondary | ICD-10-CM | POA: Diagnosis not present

## 2020-09-11 DIAGNOSIS — E1165 Type 2 diabetes mellitus with hyperglycemia: Secondary | ICD-10-CM | POA: Diagnosis not present

## 2020-09-11 DIAGNOSIS — E1140 Type 2 diabetes mellitus with diabetic neuropathy, unspecified: Secondary | ICD-10-CM | POA: Diagnosis not present

## 2020-09-16 DIAGNOSIS — G629 Polyneuropathy, unspecified: Secondary | ICD-10-CM | POA: Diagnosis not present

## 2020-09-16 DIAGNOSIS — R42 Dizziness and giddiness: Secondary | ICD-10-CM | POA: Diagnosis not present

## 2020-09-16 DIAGNOSIS — E1165 Type 2 diabetes mellitus with hyperglycemia: Secondary | ICD-10-CM | POA: Diagnosis not present

## 2020-09-16 DIAGNOSIS — I43 Cardiomyopathy in diseases classified elsewhere: Secondary | ICD-10-CM | POA: Diagnosis not present

## 2020-09-16 DIAGNOSIS — Z23 Encounter for immunization: Secondary | ICD-10-CM | POA: Diagnosis not present

## 2020-10-29 DIAGNOSIS — M47816 Spondylosis without myelopathy or radiculopathy, lumbar region: Secondary | ICD-10-CM | POA: Diagnosis not present

## 2020-10-29 DIAGNOSIS — Z981 Arthrodesis status: Secondary | ICD-10-CM | POA: Diagnosis not present

## 2020-10-29 DIAGNOSIS — M961 Postlaminectomy syndrome, not elsewhere classified: Secondary | ICD-10-CM | POA: Diagnosis not present

## 2020-11-23 ENCOUNTER — Other Ambulatory Visit: Payer: Self-pay | Admitting: Cardiology

## 2020-11-27 DIAGNOSIS — M47816 Spondylosis without myelopathy or radiculopathy, lumbar region: Secondary | ICD-10-CM | POA: Diagnosis not present

## 2020-12-03 IMAGING — MG MM BREAST LOCALIZATION CLIP
4 series · 4 of 12 positions shown · non-contrast
Comparison: Previous exam(s).

CLINICAL DATA: Two stereotactic biopsies of the right breast was
performed to sample 2 groups of calcifications in the lower outer
quadrant, separated by approximately 2.5 cm.

EXAM:
DIAGNOSTIC RIGHT MAMMOGRAM POST STEREOTACTIC BIOPSY x 2

[R LM synth-2D]
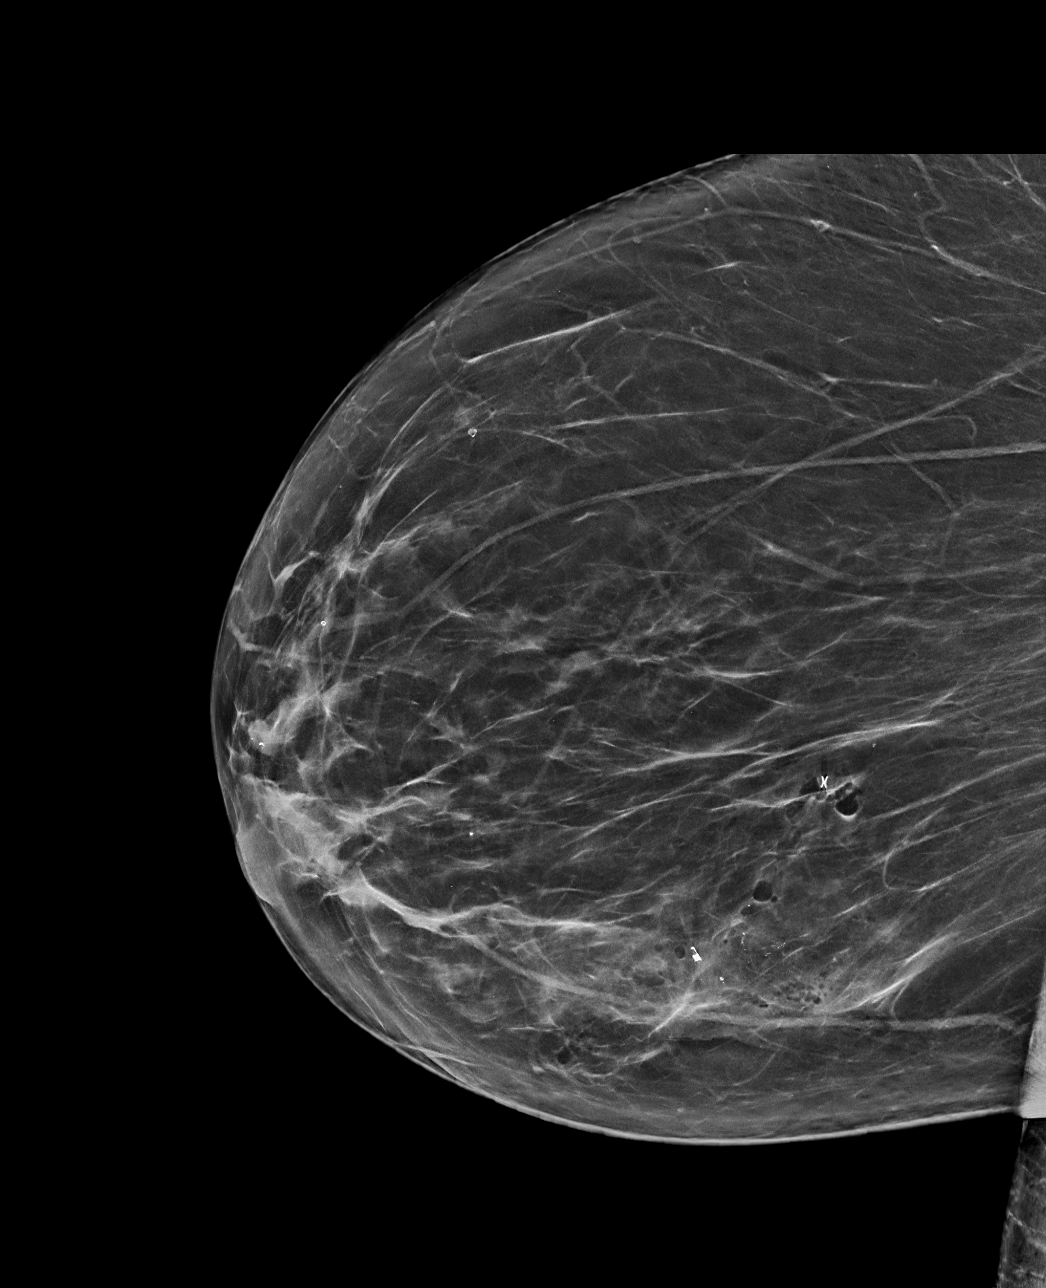

[R CC synth-2D]
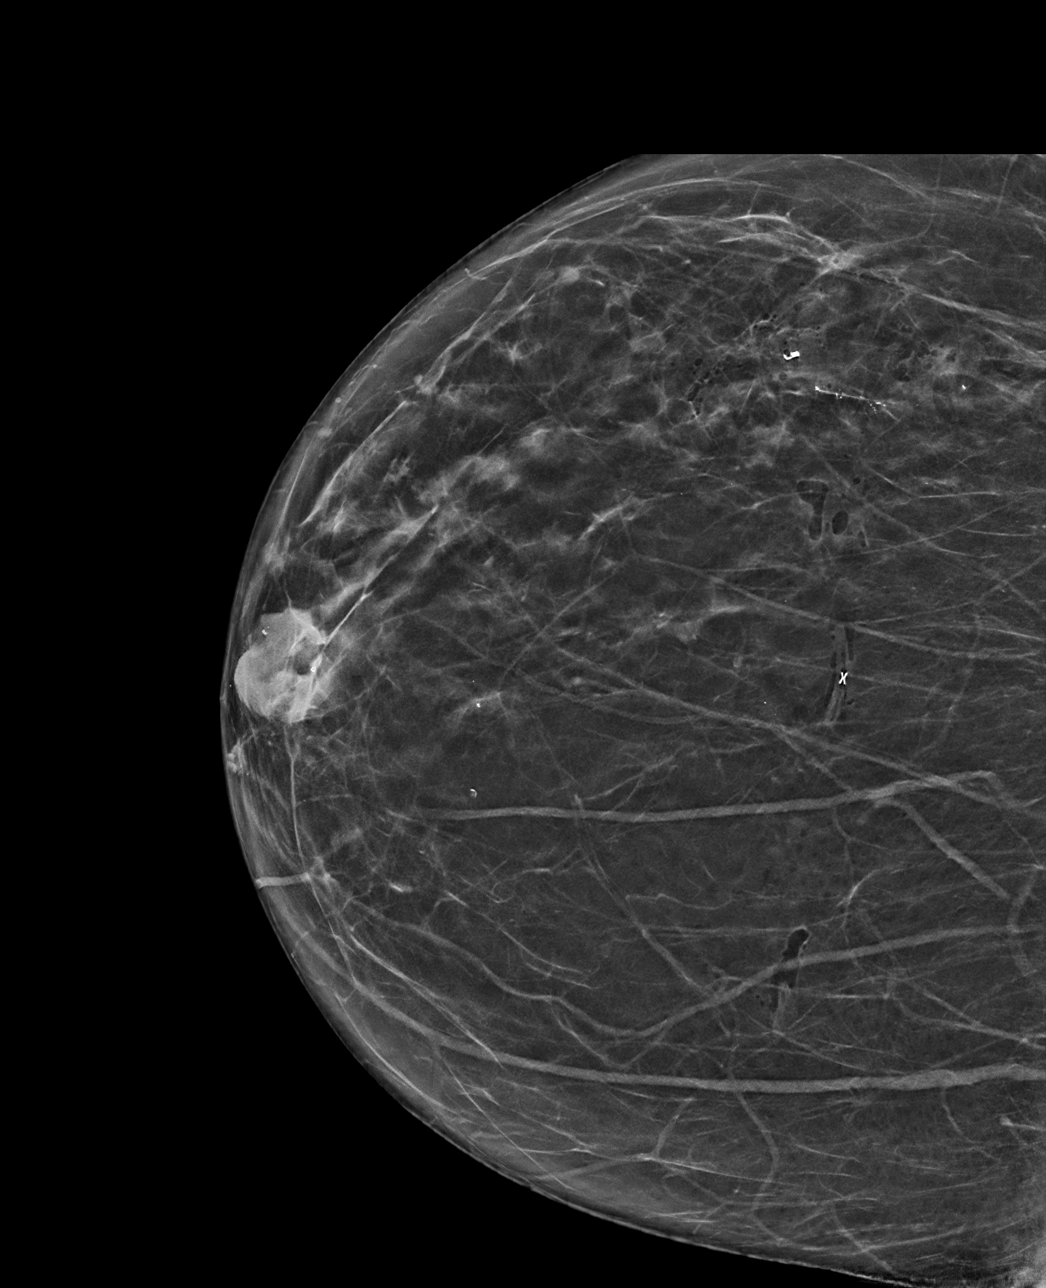

[R CC tomo · tomo slice 35/68.0]
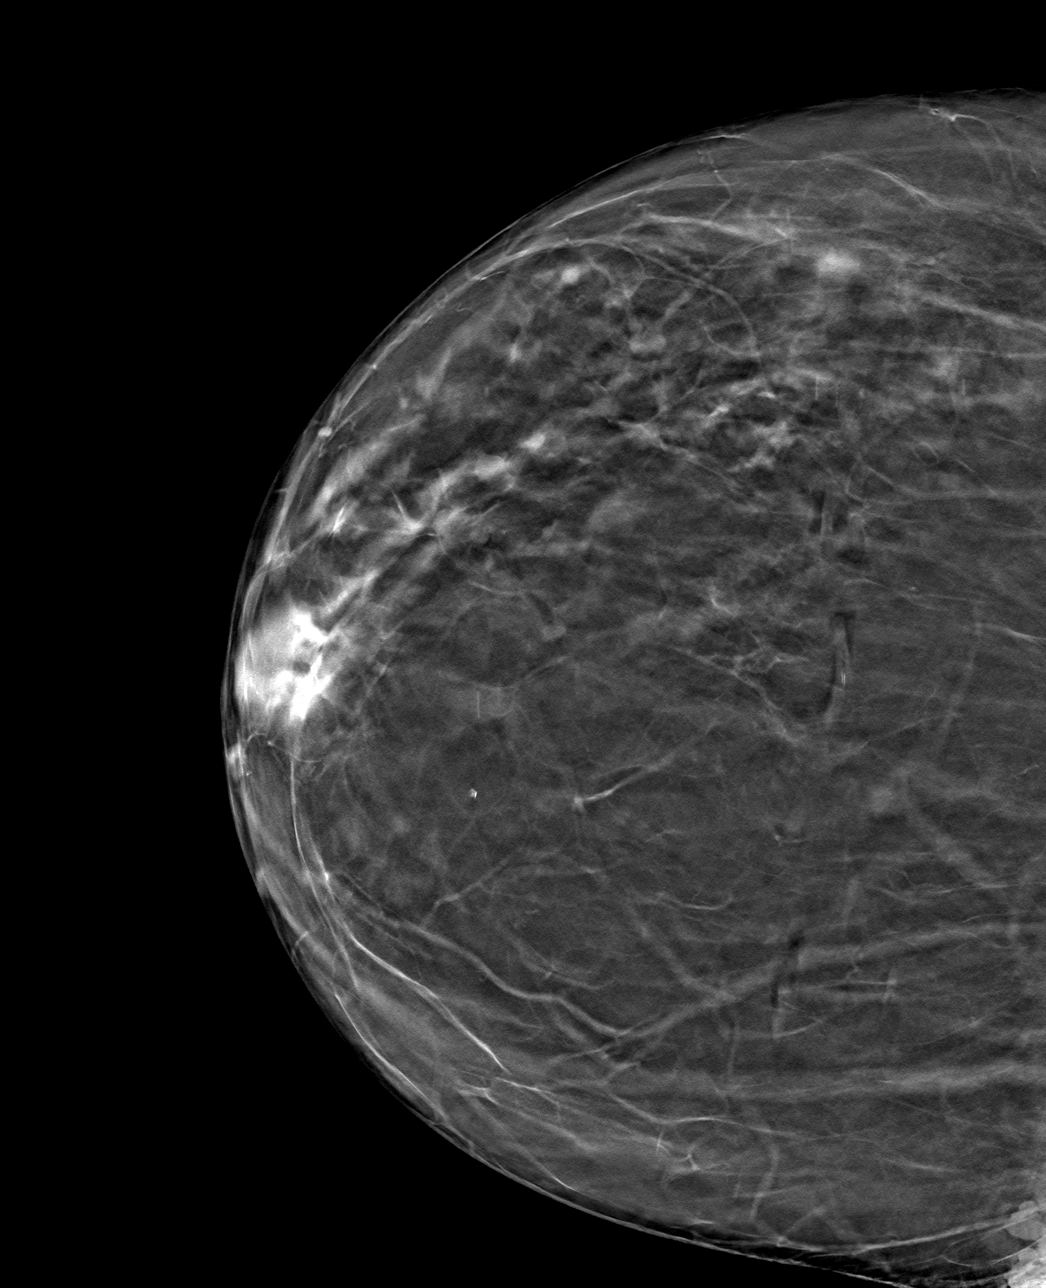

[R LM tomo · tomo slice 41/80.0]
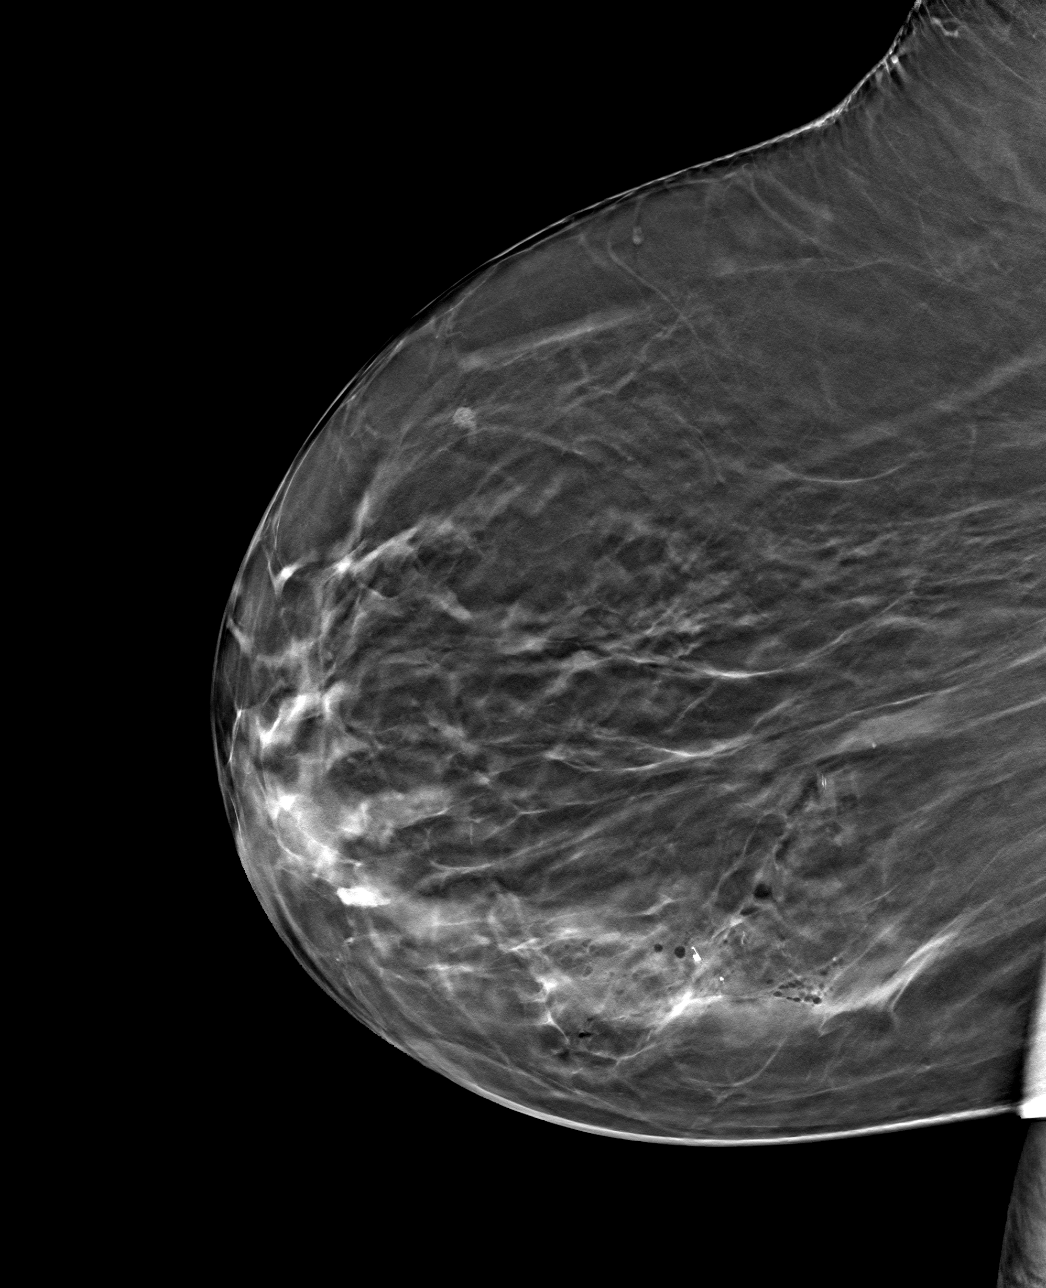

[4 of 12 positions shown; findings below may reference images not displayed]

FINDINGS: Mammographic images were obtained following 2 stereotactic guided
biopsies of the right breast. The coil shaped biopsy clip is
satisfactorily positioned along the anterolateral aspect of the more
laterally and inferiorly positioned calcifications in the lower
outer quadrant (site #1).

The X shaped biopsy clip placed at biopsy site #2 migrated medially
approximately 3.0 cm. The calcifications biopsied at site 2 are more
superior and medial than the group of calcifications at site # 1. No
residual calcifications at site #2

are visualized on the post clip mammogram.
IMPRESSION: Appropriate positioning of the coil shaped biopsy marking clip at
the site #1 in the lower outer quadrant of the right breast.

Approximate 3.0 cm medial migration of the X shaped biopsy clip at
biopsy site #2.

Final Assessment: Post Procedure Mammograms for Marker Placement

## 2021-03-11 NOTE — Progress Notes (Unsigned)
Cardiology Office Note  Date: 03/12/2021   ID: Dawn Weiss, Dawn Weiss [birth year], MRN [id number]  PCP:  Estanislado Pandy, MD  Cardiologist:  Nona Dell, MD Electrophysiologist:  None   Chief Complaint  Patient presents with  . Cardiac follow-up    History of Present Illness: Dawn Weiss is a 65 y.o. female last seen in September 2021.  She presents for a routine follow-up visit.  She has been bothered by recurring back pain which limits her activity, following with an orthopedic specialist.  She does not describe any progressive shortness of breath with typical activities, no exertional chest pain or syncope.  Her last echocardiogram was in March 2021 at which point LVEF was 40 to 45%.  We discussed obtaining an updated study.  I personally reviewed her ECG today which shows sinus bradycardia with PVC and low voltage.  I reviewed her medications which are outlined below and stable from a cardiac perspective.  Past Medical History:  Diagnosis Date  . Arthritis   . Atypical ductal hyperplasia of right breast   . Diabetes mellitus, type 2 (HCC)   . GERD (gastroesophageal reflux disease)   . History of heart murmur in childhood   . History of MRSA infection    Nasal swab  . History of pneumonia   . HNP (herniated nucleus pulposus), lumbar   . Idiopathic cardiomyopathy (HCC)    a. EF 40% in 2013 b. EF 35-40% in 2018 c. 40-45% in 05/2019  . Obesity   . Pneumonia   . Syncope    Neurocardiogenic    Past Surgical History:  Procedure Laterality Date  . ABDOMINAL HYSTERECTOMY    . BREAST LUMPECTOMY WITH RADIOACTIVE SEED LOCALIZATION Right 06/11/2020   Procedure: RADIOACTIVE SEED GUIDED RIGHT BREAST LUMPECTOMY;  Surgeon: Harriette Bouillon, MD;  Location: [facility];  Service: General;  Laterality: Right;  . CATARACT EXTRACTION W/ INTRAOCULAR LENS  IMPLANT, BILATERAL    . DILATION AND CURETTAGE OF UTERUS    . Left knee arthroscopic surgery    . LUMBAR  LAMINECTOMY/DECOMPRESSION MICRODISCECTOMY Right 11/30/2018   Procedure: Redo Right Lumbar five Sacral one Microdiscectomy;  Surgeon: Maeola Harman, MD;  Location: [facility] OR;  Service: Neurosurgery;  Laterality: Right;  . MICRODISCECTOMY LUMBAR  11/10   Right L5-S1 / 2010 and 2014  . TOTAL KNEE ARTHROPLASTY Left 05/02/2015   Procedure: LEFT TOTAL KNEE ARTHROPLASTY;  Surgeon: Samson Frederic, MD;  Location: [facility];  Service: Orthopedics;  Laterality: Left;  . TUBAL LIGATION      Current Outpatient Medications  Medication Sig Dispense Refill  . bisoprolol (ZEBETA) 5 MG tablet TAKE 1 TABLET BY MOUTH  DAILY 90 tablet 3  . furosemide (LASIX) 40 MG tablet TAKE 1 TABLET BY MOUTH EVERY DAY (Patient taking differently: as needed. TAKE 1 TABLET BY MOUTH EVERY DAY) 30 tablet 3  . gabapentin (NEURONTIN) 400 MG capsule Take 400 mg by mouth 3 (three) times daily.    Marland Kitchen HYDROcodone-acetaminophen (NORCO/VICODIN) 5-325 MG tablet Take 1 tablet by mouth every 6 (six) hours as needed for moderate pain. 15 tablet 0  . losartan (COZAAR) 25 MG tablet TAKE 1 TABLET BY MOUTH  DAILY 90 tablet 3  . metFORMIN (GLUCOPHAGE) 500 MG tablet Take 500-1,000 mg by mouth See admin instructions. Take 2 tabs by mouth every morning & 1 tab every evening    . methocarbamol (ROBAXIN) 500 MG tablet Take 1 tablet (500 mg total) by mouth every 6 (six) hours as needed  for muscle spasms. 60 tablet 1  . pantoprazole (PROTONIX) 40 MG tablet Take 40 mg by mouth every evening.     . pioglitazone (ACTOS) 15 MG tablet Take 15 mg by mouth daily.     . pravastatin (PRAVACHOL) 10 MG tablet Take 10 mg by mouth daily.     Marland Kitchen spironolactone (ALDACTONE) 25 MG tablet TAKE 1 TABLET BY MOUTH  EVERY OTHER DAY 45 tablet 3   No current facility-administered medications for this visit.   Allergies:  Patient has no known allergies.   ROS: Chronic back pain.  Physical Exam: VS:  BP 120/62   Pulse (!) 59   Ht 5\' 4"  (1.626 m)   Wt 283 lb (128.4 kg)   SpO2 94%    BMI 48.58 kg/m , BMI Body mass index is 48.58 kg/m.  Wt Readings from Last 3 Encounters:  03/12/21 283 lb (128.4 kg)  09/05/20 277 lb (125.6 kg)  06/11/20 281 lb 4.9 oz (127.6 kg)    General: Patient appears comfortable at rest. HEENT: Conjunctiva and lids normal, wearing a mask. Neck: Supple, no elevated JVP or carotid bruits, no thyromegaly. Lungs: Clear to auscultation, nonlabored breathing at rest. Cardiac: Regular rate and rhythm, no S3 or significant systolic murmur, no pericardial rub. Extremities: Mild lower leg edema.  ECG:  An ECG dated 01/07/2020 was personally reviewed today and demonstrated:  Sinus rhythm with IVCD.  Recent Labwork: 06/09/2020: ALT 13; AST 13; BUN 17; Creatinine, Ser 0.75; Hemoglobin 12.2; Platelets 192; Potassium 4.5; Sodium 140   Other Studies Reviewed Today:  Echocardiogram 02/27/2020: 1. Left ventricular ejection fraction, by estimation, is 40 to 45%. The  left ventricle has mildly decreased function. The left ventricle  demonstrates global hypokinesis. Left ventricular diastolic parameters are  consistent with Grade I diastolic  dysfunction (impaired relaxation).  2. Right ventricular systolic function is normal. The right ventricular  size is normal. There is normal pulmonary artery systolic pressure.  3. The mitral valve is normal in structure. Trivial mitral valve  regurgitation. No evidence of mitral stenosis.  4. The aortic valve is tricuspid. Aortic valve regurgitation is not  visualized. No aortic stenosis is present.   Assessment and Plan:  1.  Nonischemic cardiomyopathy, LVEF 40 to 45% by assessment last year.  Follow-up echocardiogram will be obtained.  Continue bisoprolol, losartan, and Aldactone along with Lasix at this point.  2..  History of vasovagal syncope.  Stable without obvious recurrences.  Medication Adjustments/Labs and Tests Ordered: Current medicines are reviewed at length with the patient today.  Concerns  regarding medicines are outlined above.   Tests Ordered: Orders Placed This Encounter  Procedures  . EKG 12-Lead  . ECHOCARDIOGRAM COMPLETE    Medication Changes: No orders of the defined types were placed in this encounter.   Disposition:  Follow up 6 months in the [place] office.  Signed, [place], MD, [facility] 03/12/2021 10:18 AM    [facility] at [facility] [address] [place], [place], [place] Phone: [phone]; Fax: [phone]

## 2021-03-12 ENCOUNTER — Encounter: Payer: Self-pay | Admitting: Cardiology

## 2021-03-12 ENCOUNTER — Ambulatory Visit: Payer: BC Managed Care – PPO | Admitting: Cardiology

## 2021-03-12 VITALS — BP 120/62 | HR 59 | Ht 64.0 in | Wt 283.0 lb

## 2021-03-12 DIAGNOSIS — I428 Other cardiomyopathies: Secondary | ICD-10-CM | POA: Diagnosis not present

## 2021-03-12 DIAGNOSIS — R55 Syncope and collapse: Secondary | ICD-10-CM | POA: Diagnosis not present

## 2021-03-12 NOTE — Patient Instructions (Addendum)

## 2021-04-16 ENCOUNTER — Ambulatory Visit (INDEPENDENT_AMBULATORY_CARE_PROVIDER_SITE_OTHER): Payer: BC Managed Care – PPO

## 2021-04-16 DIAGNOSIS — I428 Other cardiomyopathies: Secondary | ICD-10-CM | POA: Diagnosis not present

## 2021-04-16 LAB — ECHOCARDIOGRAM COMPLETE
Area-P 1/2: 3.6 cm2
Calc EF: 45.5 %
MV M vel: 4.69 m/s
MV Peak grad: 87.9 mmHg
Radius: 0.6 cm
S' Lateral: 5.04 cm
Single Plane A2C EF: 44.8 %
Single Plane A4C EF: 45 %

## 2021-04-21 ENCOUNTER — Telehealth: Payer: Self-pay | Admitting: *Deleted

## 2021-04-21 DIAGNOSIS — I5042 Chronic combined systolic (congestive) and diastolic (congestive) heart failure: Secondary | ICD-10-CM

## 2021-04-21 DIAGNOSIS — Z79899 Other long term (current) drug therapy: Secondary | ICD-10-CM

## 2021-04-21 NOTE — Telephone Encounter (Signed)
-----   Message from [name], MD sent at 04/16/2021  3:36 PM EDT ----- Results reviewed.  LVEF 35 to 40% range at this point with moderate diastolic dysfunction.  Mild to moderate mitral regurgitation.  If she is in agreement, would suggest switching from Cozaar to Entresto 24/26 mg twice daily.  Follow-up BMET in 7 to 10 days thereafter.

## 2021-04-27 MED ORDER — ENTRESTO 24-26 MG PO TABS: 1.0000 | ORAL_TABLET | Freq: Two times a day (BID) | ORAL | 6 refills | Status: DC

## 2021-04-27 NOTE — Telephone Encounter (Signed)
Patient informed and verbalized understanding of plan. Agrees to starting entresto. Will come to office tomorrow to pick up voucher and lab order.

## 2021-05-20 ENCOUNTER — Encounter: Payer: Self-pay | Admitting: *Deleted

## 2021-05-25 ENCOUNTER — Telehealth: Payer: Self-pay | Admitting: Cardiology

## 2021-05-25 MED ORDER — ENTRESTO 24-26 MG PO TABS: 1.0000 | ORAL_TABLET | Freq: Two times a day (BID) | ORAL | 1 refills | Status: DC

## 2021-05-25 NOTE — Telephone Encounter (Signed)
Advised that she is to stay on entresto and labs reviewed by provider and are okay. Request 90 day supply entresto be sent to [facility]. Advised that rx sent.

## 2021-05-25 NOTE — Telephone Encounter (Signed)
Please give pt a call to let her know if she's to continue taking the Entresto.  [phone]

## 2021-06-29 ENCOUNTER — Encounter (HOSPITAL_COMMUNITY): Payer: Self-pay

## 2021-10-01 ENCOUNTER — Encounter: Payer: Self-pay | Admitting: Cardiology

## 2021-10-01 ENCOUNTER — Other Ambulatory Visit: Payer: Self-pay

## 2021-10-01 ENCOUNTER — Ambulatory Visit: Payer: BC Managed Care – PPO | Admitting: Cardiology

## 2021-10-01 VITALS — BP 124/80 | HR 71 | Ht 64.0 in | Wt 265.0 lb

## 2021-10-01 DIAGNOSIS — I428 Other cardiomyopathies: Secondary | ICD-10-CM

## 2021-10-01 DIAGNOSIS — R55 Syncope and collapse: Secondary | ICD-10-CM

## 2021-10-01 NOTE — Patient Instructions (Addendum)
Medication Instructions:  Your physician recommends that you continue on your current medications as directed. Please refer to the Current Medication list given to you today.  Labwork: none  Testing/Procedures: Your physician has requested that you have a limited echocardiogram. Echocardiography is a painless test that uses sound waves to create images of your heart. It provides your doctor with information about the size and shape of your heart and how well your heart's chambers and valves are working. This procedure takes approximately one hour. There are no restrictions for this procedure.  Follow-Up: Your physician recommends that you schedule a follow-up appointment in: 6 months  Any Other Special Instructions Will Be Listed Below (If Applicable).  If you need a refill on your cardiac medications before your next appointment, please call your pharmacy.

## 2021-10-01 NOTE — Progress Notes (Signed)
Cardiology Office Note  Date: 10/01/2021   ID: Dawn Weiss [place], [place] 06-16-1956, MRN [id number]  PCP:  Dawn Pandy, MD  Cardiologist:  Dawn Dell, MD Electrophysiologist:  None   Chief Complaint  Patient presents with   Cardiac follow-up    History of Present Illness: Dawn Weiss is a 65 y.o. female last seen in March.  She is here for a follow-up visit.  Reports no major change since last encounter, although she has been able to lose a significant amount of weight through diet, down approximately 20 pounds.  She does not report any angina symptoms, has stable NYHA class II dyspnea, no palpitations or syncope.  Follow-up echocardiogram in April revealed LVEF 35 to 40% with global hypokinesis and moderate diastolic dysfunction, normal RV contraction, mild to moderate mitral regurgitation.  She was switched from Cozaar to [place] in May.  She is tolerating this.  We discussed getting a follow-up limited echocardiogram to reassess LVEF, also other potential medication additions.  We are checking into assistance for Entresto.  I reviewed the remainder of her medications which are outlined below.  Past Medical History:  Diagnosis Date   Arthritis    Atypical ductal hyperplasia of right breast    Diabetes mellitus, type 2 (HCC)    GERD (gastroesophageal reflux disease)    History of heart murmur in childhood    History of MRSA infection    Nasal swab   History of pneumonia    HNP (herniated nucleus pulposus), lumbar    Idiopathic cardiomyopathy (HCC)    a. EF 40% in 2013 b. EF 35-40% in 2018 c. 40-45% in 05/2019   Obesity    Pneumonia    Syncope    Neurocardiogenic    Past Surgical History:  Procedure Laterality Date   ABDOMINAL HYSTERECTOMY     BREAST LUMPECTOMY WITH RADIOACTIVE SEED LOCALIZATION Right 06/11/2020   Procedure: RADIOACTIVE SEED GUIDED RIGHT BREAST LUMPECTOMY;  Surgeon: Dawn Bouillon, MD;  Location: [facility];  Service:  General;  Laterality: Right;   CATARACT EXTRACTION W/ INTRAOCULAR LENS  IMPLANT, BILATERAL     DILATION AND CURETTAGE OF UTERUS     Left knee arthroscopic surgery     LUMBAR LAMINECTOMY/DECOMPRESSION MICRODISCECTOMY Right 11/30/2018   Procedure: Redo Right Lumbar five Sacral one Microdiscectomy;  Surgeon: Dawn Harman, MD;  Location: [facility] OR;  Service: Neurosurgery;  Laterality: Right;   MICRODISCECTOMY LUMBAR  11/10   Right L5-S1 / 2010 and 2014   TOTAL KNEE ARTHROPLASTY Left 05/02/2015   Procedure: LEFT TOTAL KNEE ARTHROPLASTY;  Surgeon: Dawn Frederic, MD;  Location: [facility];  Service: Orthopedics;  Laterality: Left;   TUBAL LIGATION      Current Outpatient Medications  Medication Sig Dispense Refill   bisoprolol (ZEBETA) 5 MG tablet TAKE 1 TABLET BY MOUTH  DAILY 90 tablet 3   gabapentin (NEURONTIN) 400 MG capsule Take 400 mg by mouth 3 (three) times daily.     HYDROcodone-acetaminophen (NORCO/VICODIN) 5-325 MG tablet Take 1 tablet by mouth every 6 (six) hours as needed for moderate pain. 15 tablet 0   metFORMIN (GLUCOPHAGE) 500 MG tablet Take 500-1,000 mg by mouth See admin instructions. Take 2 tabs by mouth every morning & 1 tab every evening     methocarbamol (ROBAXIN) 500 MG tablet Take 1 tablet (500 mg total) by mouth every 6 (six) hours as needed for muscle spasms. 60 tablet 1   pantoprazole (PROTONIX) 40 MG tablet Take 40 mg by mouth  every evening.      pioglitazone (ACTOS) 15 MG tablet Take 15 mg by mouth daily.      pravastatin (PRAVACHOL) 10 MG tablet Take 10 mg by mouth daily.      sacubitril-valsartan (ENTRESTO) 24-26 MG Take 1 tablet by mouth 2 (two) times daily. 180 tablet 1   spironolactone (ALDACTONE) 25 MG tablet TAKE 1 TABLET BY MOUTH  EVERY OTHER DAY 45 tablet 3   No current facility-administered medications for this visit.   Allergies:  Patient has no known allergies.   ROS: No orthopnea or PND.  Physical Exam: VS:  BP 124/80   Pulse 71   Ht 5\' 4"  (1.626 m)    Wt 265 lb (120.2 kg)   SpO2 97%   BMI 45.49 kg/m , BMI Body mass index is 45.49 kg/m.  Wt Readings from Last 3 Encounters:  10/01/21 265 lb (120.2 kg)  03/12/21 283 lb (128.4 kg)  09/05/20 277 lb (125.6 kg)    General: Patient appears comfortable at rest. HEENT: Conjunctiva and lids normal, wearing a mask. Neck: Supple, no elevated JVP or carotid bruits, no thyromegaly. Lungs: Clear to auscultation, nonlabored breathing at rest. Cardiac: Regular rate and rhythm with occasional ectopy, no S3 or significant systolic murmur, no pericardial rub. Extremities: No pitting edema.  ECG:  An ECG dated 03/12/2021 was personally reviewed today and demonstrated:  Sinus rhythm with low voltage and PVC.  Recent Labwork:  May 2022: BUN 20, creatinine 0.74, potassium 4.0, AST 14, ALT 16, hemoglobin A1c 6.3%, cholesterol 165, triglycerides 117, HDL 42, LDL 102  Other Studies Reviewed Today:  Echocardiogram 04/16/2021:  1. Left ventricular ejection fraction, by estimation, is 35 to 40%. The  left ventricle has moderately decreased function. The left ventricle  demonstrates global hypokinesis. The left ventricular internal cavity size  was mildly dilated. Left ventricular  diastolic parameters are consistent with Grade II diastolic dysfunction  (pseudonormalization). Elevated left atrial pressure. The average left  ventricular global longitudinal strain is 14.5 %. The global longitudinal  strain is abnormal.   2. Right ventricular systolic function is normal. The right ventricular  size is normal.   3. Left atrial size was moderately dilated.   4. The mitral valve is normal in structure. Mild to moderate mitral valve  regurgitation. No evidence of mitral stenosis.   5. The aortic valve is tricuspid. Aortic valve regurgitation is not  visualized. No aortic stenosis is present.   6. The inferior vena cava is normal in size with greater than 50%  respiratory variability, suggesting right atrial  pressure of 3 mmHg.   Assessment and Plan:  1.  Nonischemic cardiomyopathy with LVEF 35 to 40% range by echocardiogram in April.  She has been able to lose about 20 pounds through diet.  Current cardiac regimen includes bisoprolol, Entresto, and Aldactone.  Plan to obtain a follow-up limited echocardiogram to reassess LVEF, we also discussed potential addition of SGLT2 inhibitor.  Checking on assistance for Entresto.  2.  History of vasovagal syncope, this has been quiescent for some time now.  Medication Adjustments/Labs and Tests Ordered: Current medicines are reviewed at length with the patient today.  Concerns regarding medicines are outlined above.   Tests Ordered: Orders Placed This Encounter  Procedures   ECHOCARDIOGRAM LIMITED     Medication Changes: No orders of the defined types were placed in this encounter.   Disposition:  Follow up  6 months.  Signed, [place], MD, [place] 10/01/2021 3:02 PM  [facility] [place] at [place], [address], [place] [address] Phone: [phone]; Fax: [phone]

## 2021-10-06 ENCOUNTER — Telehealth: Payer: Self-pay | Admitting: Cardiology

## 2021-10-06 MED ORDER — ENTRESTO 24-26 MG PO TABS: 1.0000 | ORAL_TABLET | Freq: Two times a day (BID) | ORAL | 1 refills | Status: DC

## 2021-10-06 NOTE — Telephone Encounter (Signed)
    1. Which medications need to be refilled? (please list name of each medication and dose if known)  Entresto 24-26 mg   2. Which pharmacy/location (including street and city if local pharmacy) is medication to be sent to?[facility]   3. Do they need a 30 day or 90 day supply?

## 2021-10-12 ENCOUNTER — Other Ambulatory Visit: Payer: Self-pay | Admitting: Cardiology

## 2021-11-10 ENCOUNTER — Telehealth: Payer: Self-pay | Admitting: *Deleted

## 2021-11-10 ENCOUNTER — Ambulatory Visit (INDEPENDENT_AMBULATORY_CARE_PROVIDER_SITE_OTHER): Payer: BC Managed Care – PPO

## 2021-11-10 DIAGNOSIS — I428 Other cardiomyopathies: Secondary | ICD-10-CM

## 2021-11-10 DIAGNOSIS — Z79899 Other long term (current) drug therapy: Secondary | ICD-10-CM

## 2021-11-10 LAB — ECHOCARDIOGRAM LIMITED
Calc EF: 41.6 %
MV Vena cont: 0.97 cm
S' Lateral: 4.78 cm
Single Plane A2C EF: 52.5 %
Single Plane A4C EF: 30.8 %

## 2021-11-10 NOTE — Telephone Encounter (Signed)
-----   Message from [name], MD sent at 11/10/2021 10:02 AM EST ----- Results reviewed.  LVEF stable at approximately 40% with mild to moderate mitral regurgitation.  Check to see about insurance coverage for either adding Jardiance or Farxiga to further round out her cardiomyopathy regimen.  If either is started, recheck BMET in 1 month.

## 2021-11-10 NOTE — Telephone Encounter (Signed)
Per Margaretmary Dys PhD-Farxiga is on her formulary, can send in rx to her pharmacy. She qualifies for copay card as well. [place]'s last note mentioned looking into assistance for [facility], she also qualifies for a copay card for that as well since she has Nurse, learning disability

## 2021-11-18 ENCOUNTER — Other Ambulatory Visit: Payer: Self-pay | Admitting: *Deleted

## 2021-11-18 DIAGNOSIS — I428 Other cardiomyopathies: Secondary | ICD-10-CM

## 2021-11-18 DIAGNOSIS — Z79899 Other long term (current) drug therapy: Secondary | ICD-10-CM

## 2021-11-18 MED ORDER — DAPAGLIFLOZIN PROPANEDIOL 10 MG PO TABS
10.0000 mg | ORAL_TABLET | Freq: Every day | ORAL | 6 refills | Status: DC
Start: 2021-11-18 — End: 2022-06-28

## 2021-11-18 NOTE — Telephone Encounter (Signed)
Patient informed and verbalized understanding of plan. Will pick up copay cards for both farxiga and entresto-left up front.

## 2022-01-26 ENCOUNTER — Telehealth: Payer: Self-pay | Admitting: Cardiology

## 2022-01-26 MED ORDER — ENTRESTO 24-26 MG PO TABS: 1.0000 | ORAL_TABLET | Freq: Two times a day (BID) | ORAL | 1 refills | Status: DC

## 2022-01-26 NOTE — Telephone Encounter (Signed)
Done

## 2022-01-26 NOTE — Telephone Encounter (Signed)
° ° °  1. Which medications need to be refilled? (please list name of each medication and dose if known) sacubitril-valsartan (ENTRESTO) 24-26 MG   2. Which pharmacy/location (including street and city if local pharmacy) is medication to be sent to[facility] [place]   3. Do they need a 30 day or 90 day supply? 90

## 2022-03-29 ENCOUNTER — Telehealth: Payer: Self-pay | Admitting: Cardiology

## 2022-03-29 NOTE — Telephone Encounter (Signed)
Patient was calling in regards to a bill. Please advise  ?

## 2022-03-29 NOTE — Telephone Encounter (Signed)
Fwd to billing pool.   ?

## 2022-06-28 ENCOUNTER — Other Ambulatory Visit: Payer: Self-pay | Admitting: Cardiology

## 2022-06-29 ENCOUNTER — Other Ambulatory Visit: Payer: Self-pay | Admitting: Cardiology

## 2022-07-13 ENCOUNTER — Other Ambulatory Visit: Payer: Self-pay | Admitting: Cardiology

## 2022-08-23 NOTE — Progress Notes (Signed)
Cardiology Office Note  Date: 08/24/2022   ID: Marja [place], [place] 04/18/1956, MRN [id number]  PCP:  Estanislado Pandy, MD  Cardiologist:  Nona Dell, MD Electrophysiologist:  None   Chief Complaint  Patient presents with   Cardiac follow-up    History of Present Illness: Dawn Weiss is a 66 y.o. female last seen in October 2022.  She is here for a follow-up visit.  Since last encounter she has lost a substantial amount of weight through diet.  Does feel better, but still has trouble with chronic back pain.  Last echocardiogram in November 2022 revealed LVEF approximately 40% with global hypokinesis and mild to moderate mitral regurgitation.  I reviewed her medications which are noted below.  I personally reviewed her ECG today which shows sinus rhythm with low voltage.  She reports NYHA class II dyspnea, no chest pain or palpitations.  Past Medical History:  Diagnosis Date   Arthritis    Atypical ductal hyperplasia of right breast    Diabetes mellitus, type 2 (HCC)    GERD (gastroesophageal reflux disease)    History of heart murmur in childhood    History of MRSA infection    Nasal swab   History of pneumonia    HNP (herniated nucleus pulposus), lumbar    Idiopathic cardiomyopathy (HCC)    a. EF 40% in 2013 b. EF 35-40% in 2018 c. 40-45% in 05/2019   Obesity    Pneumonia    Syncope    Neurocardiogenic    Past Surgical History:  Procedure Laterality Date   ABDOMINAL HYSTERECTOMY     BREAST LUMPECTOMY WITH RADIOACTIVE SEED LOCALIZATION Right 06/11/2020   Procedure: RADIOACTIVE SEED GUIDED RIGHT BREAST LUMPECTOMY;  Surgeon: Harriette Bouillon, MD;  Location: [facility];  Service: General;  Laterality: Right;   CATARACT EXTRACTION W/ INTRAOCULAR LENS  IMPLANT, BILATERAL     DILATION AND CURETTAGE OF UTERUS     Left knee arthroscopic surgery     LUMBAR LAMINECTOMY/DECOMPRESSION MICRODISCECTOMY Right 11/30/2018   Procedure: Redo Right  Lumbar five Sacral one Microdiscectomy;  Surgeon: Maeola Harman, MD;  [facility];  Service: Neurosurgery;  Laterality: Right;   MICRODISCECTOMY LUMBAR  11/10   Right L5-S1 / 2010 and 2014   TOTAL KNEE ARTHROPLASTY Left 05/02/2015   Procedure: LEFT TOTAL KNEE ARTHROPLASTY;  Surgeon: Samson Frederic, MD;  Location: [facility];  Service: Orthopedics;  Laterality: Left;   TUBAL LIGATION      Current Outpatient Medications  Medication Sig Dispense Refill   bisoprolol (ZEBETA) 5 MG tablet TAKE 1 TABLET BY MOUTH  DAILY 90 tablet 3   ENTRESTO 24-26 MG TAKE 1 TABLET BY MOUTH TWICE A DAY 180 tablet 0   gabapentin (NEURONTIN) 400 MG capsule Take 400 mg by mouth at bedtime.     HYDROcodone-acetaminophen (NORCO/VICODIN) 5-325 MG tablet Take 1 tablet by mouth every 6 (six) hours as needed for moderate pain. 15 tablet 0   metFORMIN (GLUCOPHAGE) 500 MG tablet Take 1,000 mg by mouth daily with breakfast.     methocarbamol (ROBAXIN) 500 MG tablet Take 1 tablet (500 mg total) by mouth every 6 (six) hours as needed for muscle spasms. 60 tablet 1   pantoprazole (PROTONIX) 40 MG tablet Take 40 mg by mouth every evening.      pravastatin (PRAVACHOL) 10 MG tablet Take 10 mg by mouth daily.      dapagliflozin propanediol (FARXIGA) 10 MG TABS tablet Take 1 tablet (10 mg total) by  mouth daily. 90 tablet 3   spironolactone (ALDACTONE) 25 MG tablet Take 1 tablet (25 mg total) by mouth every other day. 45 tablet 3   No current facility-administered medications for this visit.   Allergies:  Patient has no known allergies.   ROS: No orthopnea or PND.  Physical Exam: VS:  BP (!) 116/58   Pulse 67   Ht 5\' 4"  (1.626 m)   Wt 232 lb 6.4 oz (105.4 kg)   SpO2 98%   BMI 39.89 kg/m , BMI Body mass index is 39.89 kg/m.  Wt Readings from Last 3 Encounters:  08/24/22 232 lb 6.4 oz (105.4 kg)  10/01/21 265 lb (120.2 kg)  03/12/21 283 lb (128.4 kg)    General: Patient appears comfortable at rest. HEENT: Conjunctiva and  lids normal. Neck: Supple, no elevated JVP or carotid bruits, no thyromegaly. Lungs: Clear to auscultation, nonlabored breathing at rest. Cardiac: Regular rate and rhythm, no S3 or significant systolic murmur. Extremities: No pitting edema.  ECG:  An ECG dated 03/12/2021 was personally reviewed today and demonstrated:  Sinus rhythm with low voltage and PVC.  Recent Labwork:  January 2023: BUN 16, creatinine 0.76, potassium 4.7, AST 15, ALT 13, cholesterol 165, triglycerides 137, HDL 42, LDL 99, hemoglobin A1c 6%  Other Studies Reviewed Today:  Echocardiogram 11/10/2021:  1. Limited study.   2. Left ventricular ejection fraction, by estimation, is approximately  40%. The left ventricle demonstrates global hypokinesis. The left  ventricular internal cavity size was mildly dilated. Left ventricular  diastolic function could not be evaluated.   3. Right ventricular systolic function is normal. The right ventricular  size is normal.   4. The mitral valve is abnormal. Mild to moderate mitral valve  regurgitation.   Assessment and Plan:  1.  HFmrEF with nonischemic cardiomyopathy and LVEF approximately 40% by echocardiogram in November 2022.  She is clinically stable with NYHA class II dyspnea.  Has lost a significant amount of weight through diet.  Plan to continue present regimen including December 2022, Farxiga, and Aldactone.  We will repeat echocardiogram in 6 months.  2.  History of vasovagal syncope.  No recurrent symptoms.  Medication Adjustments/Labs and Tests Ordered: Current medicines are reviewed at length with the patient today.  Concerns regarding medicines are outlined above.   Tests Ordered: Orders Placed This Encounter  Procedures   EKG 12-Lead   ECHOCARDIOGRAM COMPLETE    Medication Changes: Meds ordered this encounter  Medications   spironolactone (ALDACTONE) 25 MG tablet    Sig: Take 1 tablet (25 mg total) by mouth every other day.    Dispense:  45 tablet     Refill:  3    Requesting 1 year supply   dapagliflozin propanediol (FARXIGA) 10 MG TABS tablet    Sig: Take 1 tablet (10 mg total) by mouth daily.    Dispense:  90 tablet    Refill:  3    NEED VISIT    Disposition:  Follow up  6 months.  Signed, 12-24-2002, MD, [facility] 08/24/2022 11:35 AM    [facility] at [facility] [address] [place], [place], [place] Phone: [phone]; Fax: [phone]

## 2022-08-24 ENCOUNTER — Ambulatory Visit: Payer: Medicare Other | Attending: Cardiology | Admitting: Cardiology

## 2022-08-24 ENCOUNTER — Encounter: Payer: Self-pay | Admitting: Cardiology

## 2022-08-24 VITALS — BP 116/58 | HR 67 | Ht 64.0 in | Wt 232.4 lb

## 2022-08-24 DIAGNOSIS — I5020 Unspecified systolic (congestive) heart failure: Secondary | ICD-10-CM

## 2022-08-24 DIAGNOSIS — I428 Other cardiomyopathies: Secondary | ICD-10-CM

## 2022-08-24 MED ORDER — DAPAGLIFLOZIN PROPANEDIOL 10 MG PO TABS: 10.0000 mg | ORAL_TABLET | Freq: Every day | ORAL | 3 refills | Status: DC

## 2022-08-24 MED ORDER — SPIRONOLACTONE 25 MG PO TABS: 25.0000 mg | ORAL_TABLET | ORAL | 3 refills | Status: DC

## 2022-08-24 NOTE — Patient Instructions (Signed)

## 2022-10-13 ENCOUNTER — Other Ambulatory Visit: Payer: Self-pay | Admitting: Cardiology

## 2022-11-26 ENCOUNTER — Telehealth: Payer: Self-pay | Admitting: Cardiology

## 2022-11-26 MED ORDER — DAPAGLIFLOZIN PROPANEDIOL 10 MG PO TABS: 10.0000 mg | ORAL_TABLET | Freq: Every day | ORAL | 3 refills | Status: DC

## 2022-11-26 NOTE — Telephone Encounter (Signed)
    1. Which medications need to be refilled? (please list name of each medication and dose if known) dapagliflozin propanediol (FARXIGA) 10 MG TABS tablet   2. Which pharmacy/location (including street and city if local pharmacy) is medication to be sent to[place]   3. Do they need a 30 day or 90 day supply? 90

## 2022-12-22 ENCOUNTER — Telehealth: Payer: Self-pay | Admitting: Cardiology

## 2022-12-22 MED ORDER — ENTRESTO 24-26 MG PO TABS: 1.0000 | ORAL_TABLET | Freq: Two times a day (BID) | ORAL | 1 refills | Status: DC

## 2022-12-22 NOTE — Telephone Encounter (Signed)
   1. Which medications need to be refilled? (please list name of each medication and dose if known) ENTRESTO 24-26 MG   2. Which pharmacy/location (including street and city if local pharmacy) [facility] [place]  3. Do they need a 30 day or 90 day supply? [place]

## 2022-12-22 NOTE — Telephone Encounter (Signed)
Completed.

## 2023-02-14 ENCOUNTER — Other Ambulatory Visit: Payer: Self-pay

## 2023-02-14 ENCOUNTER — Ambulatory Visit (INDEPENDENT_AMBULATORY_CARE_PROVIDER_SITE_OTHER): Payer: Medicare Other | Admitting: Internal Medicine

## 2023-02-14 ENCOUNTER — Encounter: Payer: Self-pay | Admitting: Internal Medicine

## 2023-02-14 VITALS — BP 124/78 | HR 79 | Temp 98.3°F | Resp 18 | Ht 63.5 in | Wt 209.2 lb

## 2023-02-14 DIAGNOSIS — J343 Hypertrophy of nasal turbinates: Secondary | ICD-10-CM | POA: Diagnosis not present

## 2023-02-14 DIAGNOSIS — L299 Pruritus, unspecified: Secondary | ICD-10-CM | POA: Diagnosis not present

## 2023-02-14 DIAGNOSIS — L253 Unspecified contact dermatitis due to other chemical products: Secondary | ICD-10-CM | POA: Diagnosis not present

## 2023-02-14 DIAGNOSIS — J3089 Other allergic rhinitis: Secondary | ICD-10-CM

## 2023-02-14 MED ORDER — FLUTICASONE PROPIONATE 50 MCG/ACT NA SUSP: 2.0000 | Freq: Every day | NASAL | 5 refills | Status: DC

## 2023-02-14 MED ORDER — HYDROCORTISONE 2.5 % EX CREA: TOPICAL_CREAM | Freq: Two times a day (BID) | CUTANEOUS | 5 refills | Status: AC

## 2023-02-14 MED ORDER — CETIRIZINE HCL 10 MG PO TABS: 10.0000 mg | ORAL_TABLET | Freq: Two times a day (BID) | ORAL | 5 refills | Status: AC

## 2023-02-14 NOTE — Addendum Note (Signed)
Addended by: Norville Haggard on: 02/14/2023 05:16 PM   Modules accepted: Orders

## 2023-02-14 NOTE — Progress Notes (Signed)
NEW PATIENT  Date of Service/Encounter:  02/14/23  Consult requested by: Sasser, Silvestre Moment, MD   Subjective:   Dawn Weiss (DOB: [birth year]) is a 67 y.o. female who presents to the clinic on 02/14/2023 with a chief complaint of Pruritus (Every other day her face starts itching. Can tell onset when nose starts itching and moves to face and eyes. Has been going on for about 8 months. Can take up to 6 benadryl and does not help itching. ) .    History obtained from: chart review and patient.   Pruritus/Rash:  Started about 6-8 months ago and occurs almost every other day and lasts about 8 hours.  Occurs mostly on face and head.  She has tried changing her laundry detergent, shampoo and conditioner.  She does not wear much make up.  Reports having some small bumps but otherwise no other rashes.  She was given prednisone but it did not help much.  Has tried benadryl and zyrtec without relief.   Rhinitis:  Started when she was younger. Symptoms include: nasal congestion, rhinorrhea, and post nasal drainage  Occurs year-round but worse in Spring Potential triggers: not sure Treatments tried:  Benadryl PRN without relief Zyrtec '10mg'$  daily Last use of anti histamine was over 3 days ago.   Previous allergy testing: no History of sinus surgery: no Nonallergic triggers: none    Past Medical History: Past Medical History:  Diagnosis Date   Arthritis    Atypical ductal hyperplasia of right breast    Diabetes mellitus, type 2 (HCC)    GERD (gastroesophageal reflux disease)    History of heart murmur in childhood    History of MRSA infection    Nasal swab   History of pneumonia    HNP (herniated nucleus pulposus), lumbar    Idiopathic cardiomyopathy ([place])    a. EF 40% in 2013 b. EF 35-40% in 2018 c. 40-45% in 05/2019   Obesity    Pneumonia    Syncope    Neurocardiogenic   Past Surgical History: Past Surgical History:  Procedure Laterality Date   ABDOMINAL HYSTERECTOMY      BREAST LUMPECTOMY WITH RADIOACTIVE SEED LOCALIZATION Right 06/11/2020   Procedure: RADIOACTIVE SEED GUIDED RIGHT BREAST LUMPECTOMY;  Surgeon: Erroll Luna, MD;  Location: [facility];  Service: General;  Laterality: Right;   CATARACT EXTRACTION W/ INTRAOCULAR LENS  IMPLANT, BILATERAL     DILATION AND CURETTAGE OF UTERUS     Left knee arthroscopic surgery     LUMBAR LAMINECTOMY/DECOMPRESSION MICRODISCECTOMY Right 11/30/2018   Procedure: Redo Right Lumbar five Sacral one Microdiscectomy;  Surgeon: Erline Levine, MD;  Location: [facility];  Service: Neurosurgery;  Laterality: Right;   MICRODISCECTOMY LUMBAR  11/10   Right L5-S1 / 2010 and 2014   TOTAL KNEE ARTHROPLASTY Left 05/02/2015   Procedure: LEFT TOTAL KNEE ARTHROPLASTY;  Surgeon: Rod Can, MD;  Location: [facility];  Service: Orthopedics;  Laterality: Left;   TUBAL LIGATION      Family History: Family History  Problem Relation Age of Onset   Hypertension Mother    Heart disease Mother    Diabetes Mother    Hypercholesterolemia Mother    Asthma Father    Hypertension Father     Social History:  Lives in a unknown year house Flooring in bedroom: carpet Pets: none Tobacco use/exposure: none Job: retired, babysit  Medication List:  Allergies as of 02/14/2023   No Known Allergies      Medication List  Accurate as of February 14, 2023  4:56 PM. If you have any questions, ask your nurse or doctor.          bisoprolol 5 MG tablet Commonly known as: ZEBETA TAKE 1 TABLET BY MOUTH  DAILY   dapagliflozin propanediol 10 MG Tabs tablet Commonly known as: Farxiga Take 1 tablet (10 mg total) by mouth daily.   Entresto 24-26 MG Generic drug: sacubitril-valsartan Take 1 tablet by mouth 2 (two) times daily.   EPINEPHrine 0.3 mg/0.3 mL Soaj injection Commonly known as: EPI-PEN USE IF STUNG AND TROUBLE BREATHING OR SWALLOWING IF USE GO TO ER   gabapentin 400 MG capsule Commonly known as:  NEURONTIN Take 400 mg by mouth at bedtime.   HYDROcodone-acetaminophen 5-325 MG tablet Commonly known as: NORCO/VICODIN Take 1 tablet by mouth every 6 (six) hours as needed for moderate pain.   metFORMIN 500 MG tablet Commonly known as: GLUCOPHAGE Take 1,000 mg by mouth daily with breakfast.   methocarbamol 500 MG tablet Commonly known as: ROBAXIN Take 1 tablet (500 mg total) by mouth every 6 (six) hours as needed for muscle spasms.   pantoprazole 40 MG tablet Commonly known as: PROTONIX Take 40 mg by mouth every evening.   pravastatin 10 MG tablet Commonly known as: PRAVACHOL Take 10 mg by mouth daily.   spironolactone 25 MG tablet Commonly known as: ALDACTONE Take 1 tablet (25 mg total) by mouth every other day.         REVIEW OF SYSTEMS: Pertinent positives and negatives discussed in HPI.   Objective:   Physical Exam: BP 124/78   Pulse 79   Temp 98.3 F (36.8 C)   Resp 18   Ht 5' 3.5" (1.613 m)   Wt 209 lb 4 oz (94.9 kg)   SpO2 96%   BMI 36.49 kg/m  Body mass index is 36.49 kg/m. GEN: alert, well developed HEENT: clear conjunctiva, TM grey and translucent, nose with + inferior turbinate hypertrophy, pink nasal mucosa, slight clear rhinorrhea, no cobblestoning HEART: regular rate and rhythm, no murmur LUNGS: clear to auscultation bilaterally, no coughing, unlabored respiration ABDOMEN: soft, non distended  SKIN: no rashes or lesions  Reviewed:  No records  Skin Testing:  Skin prick testing was placed, which includes aeroallergens/foods, histamine control, and saline control.  Verbal consent was obtained prior to placing test.  Patient tolerated procedure well.  Allergy testing results were read and interpreted by myself, documented by clinical staff. Adequate positive and negative control.  Results discussed with patient/family.     Assessment:   1. Pruritus   2. Contact dermatitis due to chemicals   3. Nasal turbinate hypertrophy   4. Other  allergic rhinitis     Plan/Recommendations:  Pruritus/Dermatitis - Unclear etiology for pruritus/questionable presence of rash.  Discussed possibility of chemical trigger and would require patch testing. Will try hydrocortisone to see if it helps. Will also obtain labwork with CBC, CMP and thyroid functions.  - Do a daily soaking tub bath in warm water for 10-15 minutes.  - Use a gentle, unscented cleanser at the end of the bath (such as Dove unscented bar or baby wash, or Aveeno sensitive body wash). Then rinse, pat half-way dry, and apply a gentle, unscented moisturizer cream or ointment (Cerave, Cetaphil, Eucerin, Aveeno)  all over while still damp. Dry skin makes the itching worse. The skin should be moisturized with a gentle, unscented moisturizer at least twice daily.  - Use only unscented liquid laundry detergent. - Apply  prescribed topical steroid (hydrocortisone 2.5% above neck) to flared areas after the moisturizer has soaked into the skin (wait at least 30 minutes). Taper off the topical steroids as the skin improves. Do not use topical steroid for more than 7-10 days at a time.  - Increase to Zyrtec '10mg'$  twice daily.  - Please obtain labwork.   - Recommend setting up for patch testing.   Chronic Rhinitis: - Due to turbinate hypertrophy and unresponsive to OTC meds, performed skin testing to identify aeroallergen triggers.   - Positive skin test 01/2023: none  - Avoidance measures discussed. - Use nasal saline rinses before nose sprays such as with Neilmed Sinus Rinse.  Use distilled water.   - Use Flonase 2 sprays each nostril daily. Aim upward and outward. - Use Zyrtec 10 mg daily as needed.       No follow-ups on file.  Harlon Flor, MD [facility]

## 2023-02-14 NOTE — Patient Instructions (Addendum)
Pruritus/Dermatitis - Do a daily soaking tub bath in warm water for 10-15 minutes.  - Use a gentle, unscented cleanser at the end of the bath (such as Dove unscented bar or baby wash, or Aveeno sensitive body wash). Then rinse, pat half-way dry, and apply a gentle, unscented moisturizer cream or ointment (Cerave, Cetaphil, Eucerin, Aveeno)  all over while still damp. Dry skin makes the itching worse. The skin should be moisturized with a gentle, unscented moisturizer at least twice daily.  - Use only unscented liquid laundry detergent. - Apply prescribed topical steroid (hydrocortisone 2.5% above neck) to flared areas after the moisturizer has soaked into the skin (wait at least 30 minutes). Taper off the topical steroids as the skin improves. Do not use topical steroid for more than 7-10 days at a time.  - Increase to Zyrtec '10mg'$  twice daily.  - Please obtain labwork.   - Recommend setting up for patch testing. You will have to come back Monday, Wednesday and Friday for this.    Chronic Rhinitis: - Positive skin test 01/2023: none  - Avoidance measures discussed. - Use nasal saline rinses before nose sprays such as with Neilmed Sinus Rinse.  Use distilled water.   - If symptoms worsen, start Flonase 2 sprays each nostril daily. Aim upward and outward.

## 2023-02-23 ENCOUNTER — Ambulatory Visit: Payer: Medicare Other | Attending: Cardiology

## 2023-02-23 DIAGNOSIS — I428 Other cardiomyopathies: Secondary | ICD-10-CM

## 2023-02-24 LAB — ECHOCARDIOGRAM COMPLETE
AR max vel: 1.55 cm2
AV Peak grad: 9.4 mmHg
Ao pk vel: 1.53 m/s
Area-P 1/2: 2.71 cm2
Calc EF: 45.2 %
MV M vel: 3.55 m/s
MV Peak grad: 50.3 mmHg
P 1/2 time: 4982 msec
S' Lateral: 4.4 cm
Single Plane A2C EF: 43.4 %
Single Plane A4C EF: 48.2 %

## 2023-03-08 NOTE — Progress Notes (Unsigned)
Cardiology Office Note  Date: 03/09/2023   ID: Dawn Weiss, [place] Feb 17, 1956, MRN [id number]  History of Present Illness: Dawn Weiss is a 67 y.o. female in September 2023.  She is here for a follow-up visit.  States that she has been having rhinorrhea with itching of her face and head, attributes this to timeline around initiation of both [place].  She saw an allergist as well with no other obvious culprit.  Otherwise feels well, NYHA class I-II dyspnea, no exertional chest pain, no palpitations or syncope.  Still struggles with lower back pain.  We went over her medications today.  She has held both [place] this week, so far her symptoms have improved.  We discussed a plan to reintroduce each separately to see if we can figure out the culprit and make adjustments from there.  She has done well with weight loss over the last year.  Discussed the results of her recent echocardiogram which showed improvement in LVEF.  Physical Exam: VS:  BP 120/72   Pulse 60   Ht 5\' 4"  (1.626 m)   Wt 213 lb 6.4 oz (96.8 kg)   SpO2 98%   BMI 36.63 kg/m , BMI Body mass index is 36.63 kg/m.  Wt Readings from Last 3 Encounters:  03/09/23 213 lb 6.4 oz (96.8 kg)  02/14/23 209 lb 4 oz (94.9 kg)  08/24/22 232 lb 6.4 oz (105.4 kg)    General: Patient appears comfortable at rest. HEENT: Conjunctiva and lids normal. Neck: Supple, no elevated JVP. Lungs: Clear to auscultation, nonlabored breathing at rest. Cardiac: Regular rate and rhythm, no S3 or significant systolic murmur. Extremities: No pitting edema.  ECG:  An ECG dated 08/24/2022 was personally reviewed today and demonstrated:  Sinus rhythm with low voltage.  Labwork:  July 2023: BUN 18, creatinine 0.75, potassium 4.7, AST 13, ALT 15, cholesterol 154, triglycerides 135, HDL 38, LDL 92, hemoglobin A1c 6.1%  Other Studies Reviewed Today:  Echocardiogram 02/23/2023:  1. Left ventricular ejection  fraction, by estimation, is 45 to 50%. The  left ventricle has mildly decreased function. Left ventricular endocardial  border not optimally defined to evaluate regional wall motion. There is  mild left ventricular hypertrophy.   Left ventricular diastolic parameters are consistent with Grade I  diastolic dysfunction (impaired relaxation).   2. Right ventricular systolic function is normal. The right ventricular  size is normal. There is normal pulmonary artery systolic pressure.   3. The mitral valve is normal in structure. Trivial mitral valve  regurgitation. No evidence of mitral stenosis.   4. The aortic valve is tricuspid. Aortic valve regurgitation is trivial.  No aortic stenosis is present.   5. The inferior vena cava is normal in size with greater than 50%  respiratory variability, suggesting right atrial pressure of 3 mmHg.   Assessment and Plan:  1.  HFmrEF with history of nonischemic cardiomyopathy.  Recent echocardiogram showed improvement in LVEF to the range of 45 to 50% on medical therapy.  Symptomatically stable with NYHA class I-II dyspnea, no leg swelling and relatively stable weight.  As per above discussion plan is to reintroduce Entresto and fark [employer] separately to see if either is responsible for her symptoms.  We can make adjustments from there.  Otherwise she will remain on bisoprolol and Aldactone for now.  2.  History of vasovagal syncope.  No recurrences.  Disposition:  Follow up  6 months.  Signed, Satira Sark, M.D., F.A.C.C.

## 2023-03-09 ENCOUNTER — Encounter: Payer: Self-pay | Admitting: Cardiology

## 2023-03-09 ENCOUNTER — Ambulatory Visit: Payer: Commercial Managed Care - PPO | Attending: Cardiology | Admitting: Cardiology

## 2023-03-09 VITALS — BP 120/72 | HR 60 | Ht 64.0 in | Wt 213.4 lb

## 2023-03-09 DIAGNOSIS — I5022 Chronic systolic (congestive) heart failure: Secondary | ICD-10-CM

## 2023-03-09 DIAGNOSIS — R55 Syncope and collapse: Secondary | ICD-10-CM | POA: Diagnosis not present

## 2023-03-09 DIAGNOSIS — Z79899 Other long term (current) drug therapy: Secondary | ICD-10-CM

## 2023-03-09 NOTE — Patient Instructions (Addendum)
Medication Instructions:  Your physician has recommended you make the following change in your medication:  Restart entresto next week as directed. Continue other medications the same  Labwork: none  Testing/Procedures: none  Follow-Up: Your physician recommends that you schedule a follow-up appointment in: 6 months  Any Other Special Instructions Will Be Listed Below (If Applicable).  If you need a refill on your cardiac medications before your next appointment, please call your pharmacy.

## 2023-03-21 ENCOUNTER — Encounter: Payer: Self-pay | Admitting: Cardiology

## 2023-04-04 MED ORDER — LOSARTAN POTASSIUM 25 MG PO TABS: 12.5000 mg | ORAL_TABLET | Freq: Every day | ORAL | 1 refills | Status: DC

## 2023-04-22 ENCOUNTER — Other Ambulatory Visit: Payer: Self-pay | Admitting: Internal Medicine

## 2023-05-02 ENCOUNTER — Other Ambulatory Visit: Payer: Self-pay | Admitting: Cardiology

## 2023-05-18 ENCOUNTER — Telehealth: Payer: Self-pay | Admitting: Cardiology

## 2023-05-18 NOTE — Telephone Encounter (Signed)
Pt c/o medication issue:  1. Name of Medication:   losartan (COZAAR) 25 MG tablet   2. How are you currently taking this medication (dosage and times per day)? 0.5 tablet by mouth daily  3. Are you having a reaction (difficulty breathing--STAT)?   4. What is your medication issue?   Caller reported patient has not been taking the medication as prescribed.  Patient has been taking 1 whole tablet by mouth daily.  Caller wants to confirm how patient should be taking medication.

## 2023-05-18 NOTE — Telephone Encounter (Signed)
Will contact patient to verify blood pressure readings

## 2023-05-19 MED ORDER — BLOOD PRESSURE MONITOR MISC: 1.0000 | 0 refills | Status: AC

## 2023-05-19 NOTE — Telephone Encounter (Signed)
Blood pressure monitor prescription sent to pharmacy

## 2023-05-19 NOTE — Telephone Encounter (Signed)
Reports taking 25 mg losartan daily inadvertently vs 12.5 mg. Reports that she does not check her blood pressures at home due to not having home BP cuff available. Reports she has dizziness all the time related to taking gabapentin and noticed more dizziness after starting losartan 25 mg daily. Denies syncope or fatigue. Denies chest pain or SOB. Advised that she did need to start taking losartan 12.5 mg daily as prescribed and she may have to pay out of pocket until her insurance will cover. Says she does have enough for 6 days and will check with pharmacy to see when its due again. Advised that message would be sent to provider to approve sending prescription for home blood pressure monitor. Verbalized understanding of plan.

## 2023-06-20 ENCOUNTER — Other Ambulatory Visit: Payer: Self-pay | Admitting: Cardiology

## 2023-07-26 ENCOUNTER — Telehealth: Payer: Self-pay | Admitting: Cardiology

## 2023-07-26 NOTE — Telephone Encounter (Signed)
"  Pt c/o medication issue:  1. Name of Medication: (FARXIGA) 10   2. How are you currently taking this medication (dosage and times per day)? 1 tab daily   3. Are you having a reaction (difficulty breathing--STAT)? no  4. What is your medication issue?   Patient states that [facility] told her that the medication is going to cost her $100.00 for 30 tabs. She states that she cannot afford this.

## 2023-07-26 NOTE — Telephone Encounter (Signed)
Advised that [employer] P[employer] application is available for pick up. Advised to fill out all patient sections and bring back to office with proof of income. Verbalized understanding.

## 2023-09-15 ENCOUNTER — Ambulatory Visit: Payer: Commercial Managed Care - PPO | Attending: Cardiology | Admitting: Cardiology

## 2023-09-15 ENCOUNTER — Encounter: Payer: Self-pay | Admitting: Cardiology

## 2023-09-15 VITALS — BP 118/74 | HR 83 | Ht 64.0 in | Wt 200.2 lb

## 2023-09-15 DIAGNOSIS — I5020 Unspecified systolic (congestive) heart failure: Secondary | ICD-10-CM

## 2023-09-15 DIAGNOSIS — I428 Other cardiomyopathies: Secondary | ICD-10-CM

## 2023-09-15 DIAGNOSIS — R55 Syncope and collapse: Secondary | ICD-10-CM | POA: Diagnosis not present

## 2023-09-15 MED ORDER — BISOPROLOL FUMARATE 5 MG PO TABS: 5.0000 mg | ORAL_TABLET | Freq: Every day | ORAL | 3 refills | Status: DC

## 2023-09-15 MED ORDER — LOSARTAN POTASSIUM 25 MG PO TABS: 12.5000 mg | ORAL_TABLET | Freq: Every day | ORAL | 3 refills | Status: DC

## 2023-09-15 NOTE — Progress Notes (Signed)
Cardiology Office Note  Date: 09/15/2023   ID: Dawn Weiss, Dawn Weiss 03-02-1956, MRN [id number]  History of Present Illness: Dawn Weiss is a 67 y.o. female last seen in March.  She is doing well, NYHA class II dyspnea, no fluid retention.  She has also been losing weight cutting back calories and also on current medical regimen.  We went over her medications.  She was able to determine that Sherryll Burger was causing her facial rash previously.  She is now back on Cozaar along with bisoprolol, Aldactone, and Farxiga.  ECG today shows normal sinus rhythm with decreased R wave progression.  Echocardiogram from March showed LVEF 45 to 50% range.  I reviewed her lab work from August.  Physical Exam: VS:  BP 118/74   Pulse 83   Ht 5\' 4"  (1.626 m)   Wt 200 lb 3.2 oz (90.8 kg)   SpO2 95%   BMI 34.36 kg/m , BMI Body mass index is 34.36 kg/m.  Wt Readings from Last 3 Encounters:  09/15/23 200 lb 3.2 oz (90.8 kg)  03/09/23 213 lb 6.4 oz (96.8 kg)  02/14/23 209 lb 4 oz (94.9 kg)    General: Patient appears comfortable at rest. HEENT: Conjunctiva and lids normal. Neck: Supple, no elevated JVP. Lungs: Clear to auscultation, nonlabored breathing at rest. Cardiac: Regular rate and rhythm, no S3 or significant systolic murmur. Extremities: No pitting edema.  ECG:  An ECG dated 08/24/2022 was personally reviewed today and demonstrated:  Sinus rhythm with low voltage.  Labwork:  August 2024: BUN 16, creatinine 0.58, potassium 4, AST 11, ALT 8, cholesterol 145, triglycerides 81, HDL 41, LDL 88  Other Studies Reviewed Today:  Echocardiogram 02/23/2023:  1. Left ventricular ejection fraction, by estimation, is 45 to 50%. The  left ventricle has mildly decreased function. Left ventricular endocardial  border not optimally defined to evaluate regional wall motion. There is  mild left ventricular hypertrophy.   Left ventricular diastolic parameters are consistent with Grade I   diastolic dysfunction (impaired relaxation).   2. Right ventricular systolic function is normal. The right ventricular  size is normal. There is normal pulmonary artery systolic pressure.   3. The mitral valve is normal in structure. Trivial mitral valve  regurgitation. No evidence of mitral stenosis.   4. The aortic valve is tricuspid. Aortic valve regurgitation is trivial.  No aortic stenosis is present.   5. The inferior vena cava is normal in size with greater than 50%  respiratory variability, suggesting right atrial pressure of 3 mmHg.   Assessment and Plan:  1.  HFmrEF with history of nonischemic cardiomyopathy.  Echocardiogram in March showed LVEF 45 to 50%.  She is clinically stable with NYHA class I dyspnea and no fluid retention.  Continue bisoprolol, Farxiga, Cozaar, and Aldactone.  We will update echocardiogram for her next visit.   2.  History of vasovagal syncope.  No recurrences.  Disposition:  Follow up  6 months.  Signed, [name], M.D., F.A.C.C. [facility]

## 2023-09-15 NOTE — Patient Instructions (Addendum)
Medication Instructions:  Your physician recommends that you continue on your current medications as directed. Please refer to the Current Medication list given to you today.  Labwork: none  Testing/Procedures: Your physician has requested that you have an echocardiogram in 6 months just before next visit. Echocardiography is a painless test that uses sound waves to create images of your heart. It provides your doctor with information about the size and shape of your heart and how well your heart's chambers and valves are working. This procedure takes approximately one hour. There are no restrictions for this procedure. Please do NOT wear cologne, perfume, aftershave, or lotions (deodorant is allowed). Please arrive 15 minutes prior to your appointment time.  Follow-Up: Your physician recommends that you schedule a follow-up appointment in: 6 months  Any Other Special Instructions Will Be Listed Below (If Applicable).  If you need a refill on your cardiac medications before your next appointment, please call your pharmacy.

## 2024-03-13 ENCOUNTER — Ambulatory Visit: Payer: Commercial Managed Care - PPO | Attending: Cardiology

## 2024-03-13 DIAGNOSIS — I428 Other cardiomyopathies: Secondary | ICD-10-CM | POA: Diagnosis not present

## 2024-03-13 MED ORDER — PERFLUTREN LIPID MICROSPHERE
1.0000 mL | INTRAVENOUS | Status: AC | PRN
Start: 2024-03-13 — End: 2024-03-13
  Administered 2024-03-13: 6 mL via INTRAVENOUS

## 2024-03-14 LAB — ECHOCARDIOGRAM COMPLETE
AR max vel: 1.69 cm2
AV Area VTI: 1.83 cm2
AV Area mean vel: 1.85 cm2
AV Mean grad: 5 mmHg
AV Peak grad: 9.6 mmHg
Ao pk vel: 1.55 m/s
Area-P 1/2: 4.63 cm2
Calc EF: 34.9 %
MV VTI: 2.1 cm2
S' Lateral: 4.8 cm
Single Plane A2C EF: 32.9 %
Single Plane A4C EF: 34.6 %

## 2024-03-15 ENCOUNTER — Ambulatory Visit: Payer: Commercial Managed Care - PPO | Admitting: Nurse Practitioner

## 2024-03-20 ENCOUNTER — Ambulatory Visit: Payer: Commercial Managed Care - PPO | Attending: Nurse Practitioner | Admitting: Nurse Practitioner

## 2024-03-20 ENCOUNTER — Encounter: Payer: Self-pay | Admitting: Nurse Practitioner

## 2024-03-20 VITALS — BP 118/74 | HR 74 | Ht 64.0 in | Wt 181.0 lb

## 2024-03-20 DIAGNOSIS — R42 Dizziness and giddiness: Secondary | ICD-10-CM

## 2024-03-20 DIAGNOSIS — I5020 Unspecified systolic (congestive) heart failure: Secondary | ICD-10-CM | POA: Diagnosis not present

## 2024-03-20 DIAGNOSIS — E669 Obesity, unspecified: Secondary | ICD-10-CM | POA: Diagnosis not present

## 2024-03-20 DIAGNOSIS — Z87898 Personal history of other specified conditions: Secondary | ICD-10-CM

## 2024-03-20 MED ORDER — BLOOD PRESSURE MONITOR DEVI
1.0000 | Freq: Every day | 0 refills | Status: AC
Start: 2024-03-20 — End: ?

## 2024-03-20 NOTE — Patient Instructions (Addendum)

## 2024-03-20 NOTE — Progress Notes (Unsigned)
 Cardiology Office Note:  .   Date:  03/20/2024 ID:  Dawn Weiss [place], DOB 02-Mar-1956, MRN [id number] PCP: Estanislado Pandy, MD  [facility] Providers Cardiologist:  Nona Dell, MD    History of Present Illness: .   Dawn Weiss is a very pleasant 68 y.o. female with a PMH of chronic systolic CHF, hx of vasovagal syncope, T2DM, and obesity, who presents today for 6 month follow-up.   TTE 02/2023 revealed EF 45 to 50%.  Last seen by Dr. Diona Browner on September 14, 2024.  Was doing well at that time.  Today she presents for 15-month follow-up appointment.  She states she is doing well.  She has lost a significant amount of weight since last fall.  She has been working hard on doing lifestyle changes.  She has lost almost 20 pounds since last office visit.  Denies any chest pain, shortness of breath, palpitations, syncope, presyncope, orthopnea, PND, swelling or significant weight changes, acute bleeding, or claudication.  She does admit to some what sounds like orthostatic dizziness at times.   ROS: Negative. See HPI.  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  FH: Mother had heart disease, she is unsure what kind, but did believe mother had a defibrillator.  Also has stated mother sister had heart disease.   Studies Reviewed: Marland Kitchen    EKG: EKG is not ordered today.  Echo 02/2024:  1. Left ventricular ejection fraction, by estimation, is 30 to 35%. The  left ventricle has moderately decreased function. The left ventricle  demonstrates global hypokinesis. Left ventricular diastolic parameters are consistent with Grade I diastolic dysfunction (impaired relaxation). Elevated left atrial pressure.   2. Right ventricular systolic function is low normal. The right  ventricular size is normal.   3. The mitral valve is abnormal. Mild mitral valve regurgitation. No   evidence of mitral stenosis.   4. The aortic valve is tricuspid. Aortic valve regurgitation is not  visualized. No aortic stenosis is present.   Comparison(s): A prior study was performed on 02/23/2023. EF 45-50%. Trivial mitral and aortic regurgitation.   Physical Exam:   VS:  BP 118/74   Pulse 74   Ht 5\' 4"  (1.626 m)   Wt 181 lb (82.1 kg)   SpO2 98%   BMI 31.07 kg/m    Wt Readings from Last 3 Encounters:  03/20/24 181 lb (82.1 kg)  09/15/23 200 lb 3.2 oz (90.8 kg)  03/09/23 213 lb 6.4 oz (96.8 kg)    Orthostatic vital signs: Lying: 129/84, 66 bpm Sitting: 126/86, 69 bpm Standing: 139/87, 73 bpm Standing x 3 minutes: 138/89, 74 bpm  GEN: Well nourished, well developed in no acute distress NECK: No JVD; No carotid bruits CARDIAC: S1/S2, RRR, no murmurs, rubs, gallops RESPIRATORY:  Clear to auscultation without rales, wheezing or rhonchi  ABDOMEN: Soft, non-tender, non-distended EXTREMITIES:  No edema; No deformity   ASSESSMENT AND PLAN: .    HFrEF Stage C, NYHA class I symptoms.  Recent echocardiogram revealed EF 30 to 35%, previously 45 to 50%.  Unclear reason as to why EF dropped.  GDMT limited due to her current symptoms and past intolerance to [facility].  Continue bisoprolol, Farxiga, losartan, and spironolactone. Low sodium diet, fluid restriction <2L, and daily weights encouraged. Educated to contact our office for weight gain of 2 lbs overnight or 5  lbs in one week.  Hx of vasovagal syncope, orthostatic dizziness Denies any syncopal episodes.  Does admit to some dizziness.  Orthostatics negative today.  Possibly related to dehydration.  Patient says she can drink more liquids.  Encouraged her to stay well-hydrated.  Also recommended compression stockings.  No medication changes at this time.  Care and ED precautions discussed.  She will monitor and log her BP and will provide BP cuff for her to monitor this at home.  She will bring this log for review at next follow-up  visit.  Obesity Weight loss via diet and exercise encouraged. Discussed the impact being overweight would have on cardiovascular risk.  Congratulated her on her weight loss so far.  She continues to want to lose weight.  Continue Ozempic.  Continue follow-up with PCP.   Dispo: Follow-up with me/APP in 6 to 8 weeks or sooner if any changes.  Signed, Sharlene Dory, NP

## 2024-03-26 ENCOUNTER — Telehealth: Payer: Self-pay | Admitting: Nurse Practitioner

## 2024-03-26 NOTE — Telephone Encounter (Signed)
 Patient called stating that the BP cuff you prescribed for her is not covered under her insurance.

## 2024-03-26 NOTE — Telephone Encounter (Signed)
 Informed patient to purchase one OTC if she could afford it. Due to insurance not covering it.

## 2024-05-17 ENCOUNTER — Ambulatory Visit: Attending: Nurse Practitioner | Admitting: Nurse Practitioner

## 2024-05-17 ENCOUNTER — Encounter: Payer: Self-pay | Admitting: Nurse Practitioner

## 2024-05-17 VITALS — BP 122/68 | HR 67 | Ht 65.0 in | Wt 182.0 lb

## 2024-05-17 DIAGNOSIS — Z87898 Personal history of other specified conditions: Secondary | ICD-10-CM | POA: Diagnosis not present

## 2024-05-17 DIAGNOSIS — E669 Obesity, unspecified: Secondary | ICD-10-CM

## 2024-05-17 DIAGNOSIS — I5020 Unspecified systolic (congestive) heart failure: Secondary | ICD-10-CM | POA: Diagnosis not present

## 2024-05-17 MED ORDER — SPIRONOLACTONE 25 MG PO TABS: 25.0000 mg | ORAL_TABLET | ORAL | 1 refills | Status: DC

## 2024-05-17 MED ORDER — PRAVASTATIN SODIUM 10 MG PO TABS: 10.0000 mg | ORAL_TABLET | Freq: Every day | ORAL | 1 refills | Status: AC

## 2024-05-17 MED ORDER — LOSARTAN POTASSIUM 25 MG PO TABS: 12.5000 mg | ORAL_TABLET | Freq: Every day | ORAL | 1 refills | Status: AC

## 2024-05-17 MED ORDER — DAPAGLIFLOZIN PROPANEDIOL 10 MG PO TABS: 10.0000 mg | ORAL_TABLET | Freq: Every day | ORAL | 1 refills | Status: AC

## 2024-05-17 MED ORDER — BISOPROLOL FUMARATE 5 MG PO TABS: 5.0000 mg | ORAL_TABLET | Freq: Every day | ORAL | 1 refills | Status: AC

## 2024-05-17 NOTE — Progress Notes (Signed)
 Cardiology Office Note:  .   Date:  05/17/2024 ID:  Dawn Weiss, DOB July 30, 1956, MRN [id number] PCP: Orest Bio, MD  [facility] Providers Cardiologist:  Teddie Favre, MD    History of Present Illness: .   Dawn Weiss is a very pleasant 68 y.o. female with a PMH of chronic systolic CHF, hx of vasovagal syncope, T2DM, and obesity, who presents today for scheduled follow-up.   TTE 02/2023 revealed EF 45 to 50%.  Last seen by Dr. Londa Rival on September 14, 2024.  Was doing well at that time.  03/20/2024 - Today she presents for 69-month follow-up appointment.  She states she is doing well.  She has lost a significant amount of weight since last fall.  She has been working hard on doing lifestyle changes.  She has lost almost 20 pounds since last office visit.  Denies any chest pain, shortness of breath, palpitations, syncope, presyncope, orthopnea, PND, swelling or significant weight changes, acute bleeding, or claudication.  She does admit to some what sounds like orthostatic dizziness at times.   05/17/2024 -doing much better since I last saw her in the office.  She shows me her labs that she had recently performed with Dr. Marykay Snipes on May 01, 2024 that revealed complete metabolic panel, fasting lipid profile, and A1c that are within normal limits. Tells me in the past she could not tolerate Entresto  and this has been added to her allergy  list.  She is doing well on her current medication regimen. Denies any chest pain, shortness of breath, palpitations, syncope, presyncope, dizziness, orthopnea, PND, swelling or significant weight changes, acute bleeding, or claudication.  ROS: Negative. See HPI.  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  FH: Mother had heart disease, she is unsure what kind, but did believe mother had a defibrillator.  Also has stated  mother sister had heart disease.   Studies Reviewed: Aaron Aas    EKG: EKG is not ordered today.  Echo 02/2024:  1. Left ventricular ejection fraction, by estimation, is 30 to 35%. The  left ventricle has moderately decreased function. The left ventricle  demonstrates global hypokinesis. Left ventricular diastolic parameters are consistent with Grade I diastolic dysfunction (impaired relaxation). Elevated left atrial pressure.   2. Right ventricular systolic function is low normal. The right  ventricular size is normal.   3. The mitral valve is abnormal. Mild mitral valve regurgitation. No  evidence of mitral stenosis.   4. The aortic valve is tricuspid. Aortic valve regurgitation is not  visualized. No aortic stenosis is present.   Comparison(s): A prior study was performed on 02/23/2023. EF 45-50%. Trivial mitral and aortic regurgitation.   Physical Exam:   VS:  BP 122/68   Pulse 67   Ht 5\' 5"  (1.651 m)   Wt 182 lb (82.6 kg)   SpO2 97%   BMI 30.29 kg/m    Wt Readings from Last 3 Encounters:  05/17/24 182 lb (82.6 kg)  03/20/24 181 lb (82.1 kg)  09/15/23 200 lb 3.2 oz (90.8 kg)    GEN: Well nourished, well developed in no acute distress NECK: No JVD; No carotid bruits CARDIAC: S1/S2, RRR, no murmurs, rubs, gallops RESPIRATORY:  Clear to auscultation without rales, wheezing or rhonchi  ABDOMEN: Soft, non-tender, non-distended EXTREMITIES:  No edema; No deformity   ASSESSMENT AND PLAN: .  HFrEF Stage C, NYHA class I symptoms.  Recent echocardiogram 02/2024 revealed EF 30 to 35%, previously 45 to 50%.  Unclear reason as to why EF dropped.  GDMT limited due to her current symptoms and past intolerance to Entresto .  Continue bisoprolol , Farxiga , losartan , and spironolactone . Low sodium diet, fluid restriction <2L, and daily weights encouraged. Educated to contact our office for weight gain of 2 lbs overnight or 5 lbs in one week.  Hx of vasovagal syncope Denies any syncopal episodes  or recent dizziness.  Orthostatics negative in past.  Possibly related to dehydration.  Encouraged her to stay well-hydrated.  Also recommended compression stockings.  No medication changes at this time.  Care and ED precautions discussed.    Obesity Weight loss via diet and exercise encouraged. Discussed the impact being overweight would have on cardiovascular risk.  Congratulated her on her weight loss so far.  She continues to want to lose weight.  Continue Ozempic.  Continue follow-up with PCP.   Dispo: Will provide refills per her request.  Follow-up with MD/APP in 6 months or sooner if any changes.  Signed, Lasalle Pointer, NP

## 2024-05-17 NOTE — Patient Instructions (Addendum)

## 2025-01-01 ENCOUNTER — Other Ambulatory Visit: Payer: Self-pay | Admitting: Nurse Practitioner
